# Patient Record
Sex: Female | Born: 1935 | Race: White | Hispanic: No | Marital: Married | Smoking: Never smoker
Health system: Southern US, Community
[De-identification: ages and names within clinical notes are randomized; demographics above are authoritative.]

## PROBLEM LIST (undated history)

## (undated) DIAGNOSIS — I10 Essential (primary) hypertension: Secondary | ICD-10-CM

## (undated) DIAGNOSIS — I4891 Unspecified atrial fibrillation: Secondary | ICD-10-CM

## (undated) DIAGNOSIS — I351 Nonrheumatic aortic (valve) insufficiency: Secondary | ICD-10-CM

## (undated) DIAGNOSIS — I272 Pulmonary hypertension, unspecified: Secondary | ICD-10-CM

## (undated) DIAGNOSIS — J449 Chronic obstructive pulmonary disease, unspecified: Secondary | ICD-10-CM

## (undated) DIAGNOSIS — I340 Nonrheumatic mitral (valve) insufficiency: Secondary | ICD-10-CM

## (undated) DIAGNOSIS — C801 Malignant (primary) neoplasm, unspecified: Secondary | ICD-10-CM

## (undated) DIAGNOSIS — I071 Rheumatic tricuspid insufficiency: Secondary | ICD-10-CM

## (undated) DIAGNOSIS — H269 Unspecified cataract: Secondary | ICD-10-CM

## (undated) DIAGNOSIS — I509 Heart failure, unspecified: Secondary | ICD-10-CM

## (undated) DIAGNOSIS — E079 Disorder of thyroid, unspecified: Secondary | ICD-10-CM

## (undated) DIAGNOSIS — I2510 Atherosclerotic heart disease of native coronary artery without angina pectoris: Secondary | ICD-10-CM

## (undated) HISTORY — DX: Pulmonary hypertension, unspecified: I27.20

## (undated) HISTORY — PX: ABDOMINAL HYSTERECTOMY: SHX81

## (undated) HISTORY — DX: Nonrheumatic aortic (valve) insufficiency: I35.1

## (undated) HISTORY — PX: APPENDECTOMY: SHX54

## (undated) HISTORY — DX: Heart failure, unspecified: I50.9

## (undated) HISTORY — DX: Chronic obstructive pulmonary disease, unspecified: J44.9

## (undated) HISTORY — DX: Nonrheumatic mitral (valve) insufficiency: I34.0

## (undated) HISTORY — DX: Unspecified cataract: H26.9

## (undated) HISTORY — PX: CATARACT EXTRACTION: SUR2

## (undated) HISTORY — DX: Essential (primary) hypertension: I10

## (undated) HISTORY — DX: Rheumatic tricuspid insufficiency: I07.1

## (undated) HISTORY — DX: Disorder of thyroid, unspecified: E07.9

## (undated) HISTORY — DX: Atherosclerotic heart disease of native coronary artery without angina pectoris: I25.10

## (undated) HISTORY — PX: BREAST LUMPECTOMY: SHX2

## (undated) HISTORY — PX: EYE SURGERY: SHX253

---

## 2005-08-23 ENCOUNTER — Encounter: Admission: RE | Admit: 2005-08-23 | Discharge: 2005-08-23 | Payer: Self-pay | Admitting: *Deleted

## 2006-03-28 ENCOUNTER — Ambulatory Visit (HOSPITAL_COMMUNITY): Admission: RE | Admit: 2006-03-28 | Discharge: 2006-03-28 | Payer: Self-pay | Admitting: Internal Medicine

## 2006-10-18 ENCOUNTER — Ambulatory Visit (HOSPITAL_BASED_OUTPATIENT_CLINIC_OR_DEPARTMENT_OTHER): Admission: RE | Admit: 2006-10-18 | Discharge: 2006-10-18 | Payer: Self-pay | Admitting: Orthopedic Surgery

## 2006-10-18 ENCOUNTER — Encounter (INDEPENDENT_AMBULATORY_CARE_PROVIDER_SITE_OTHER): Payer: Self-pay | Admitting: Specialist

## 2007-05-31 ENCOUNTER — Ambulatory Visit (HOSPITAL_COMMUNITY): Admission: RE | Admit: 2007-05-31 | Discharge: 2007-05-31 | Payer: Self-pay | Admitting: Internal Medicine

## 2009-06-12 ENCOUNTER — Ambulatory Visit (HOSPITAL_COMMUNITY): Admission: RE | Admit: 2009-06-12 | Discharge: 2009-06-12 | Payer: Self-pay | Admitting: Internal Medicine

## 2010-01-29 ENCOUNTER — Emergency Department (HOSPITAL_COMMUNITY): Admission: EM | Admit: 2010-01-29 | Discharge: 2010-01-29 | Payer: Self-pay | Admitting: Emergency Medicine

## 2010-02-06 ENCOUNTER — Emergency Department (HOSPITAL_COMMUNITY): Admission: EM | Admit: 2010-02-06 | Discharge: 2010-02-06 | Payer: Self-pay | Admitting: Emergency Medicine

## 2010-03-19 ENCOUNTER — Inpatient Hospital Stay (HOSPITAL_COMMUNITY): Admission: AD | Admit: 2010-03-19 | Discharge: 2010-03-22 | Payer: Self-pay | Admitting: Internal Medicine

## 2010-04-02 ENCOUNTER — Encounter: Admission: RE | Admit: 2010-04-02 | Discharge: 2010-04-02 | Payer: Self-pay | Admitting: Internal Medicine

## 2010-04-07 ENCOUNTER — Encounter: Admission: RE | Admit: 2010-04-07 | Discharge: 2010-04-07 | Payer: Self-pay | Admitting: Internal Medicine

## 2010-04-07 ENCOUNTER — Other Ambulatory Visit: Admission: RE | Admit: 2010-04-07 | Discharge: 2010-04-07 | Payer: Self-pay | Admitting: Interventional Radiology

## 2010-07-15 ENCOUNTER — Ambulatory Visit: Payer: Self-pay | Admitting: Cardiology

## 2011-01-09 IMAGING — US US SOFT TISSUE HEAD/NECK
1 series · 13 of 25 positions shown · non-contrast
Comparison: CT chest of 03/19/2010

CLINICAL DATA: Hyperthyroidism, prominent left thyroid noted on CT
chest of 03/19/2010

THYROID ULTRASOUND
TECHNIQUE: Ultrasound examination of the thyroid gland and
adjacent soft tissues was performed.

[Series 1: us soft tissue head/neck · 0.04mm/px · 13 of 63 slices shown]
[im 1/63]
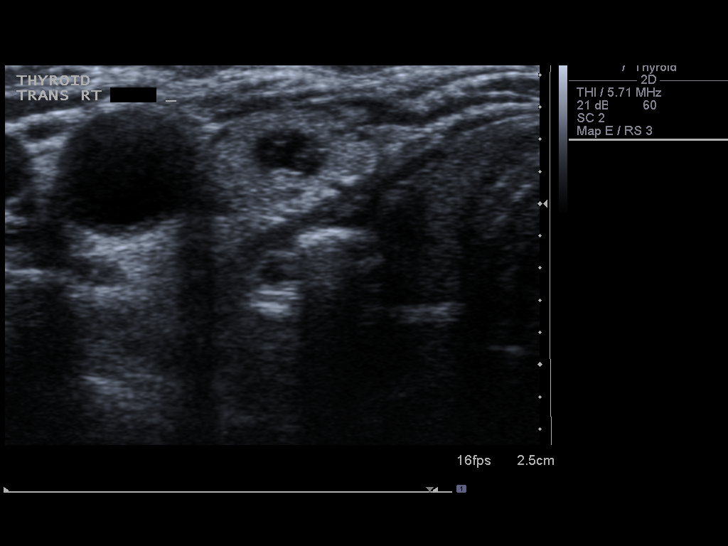
[im 6/63]
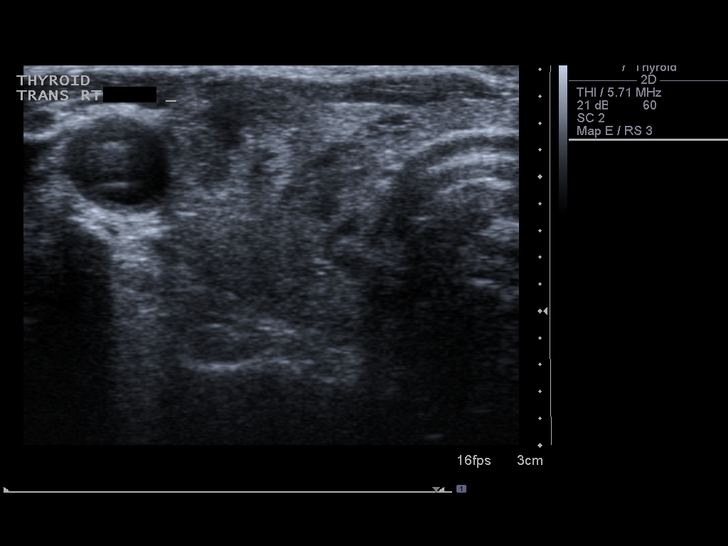
[im 11/63]
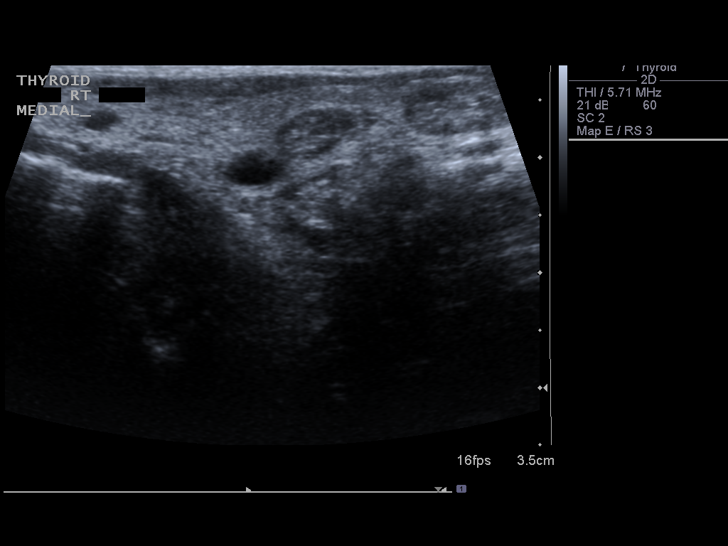
[im 16/63]
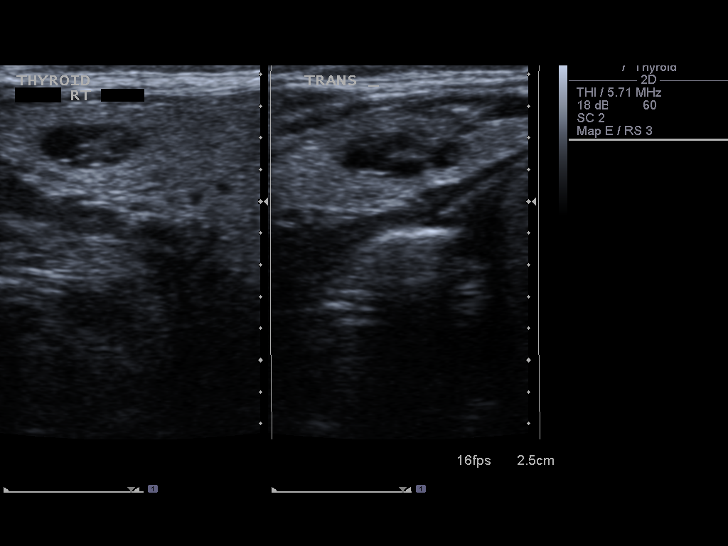
[im 21/63]
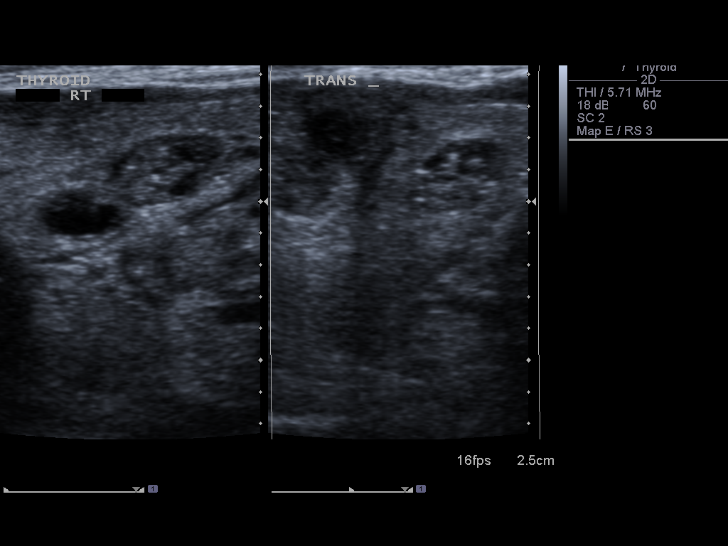
[im 26/63]
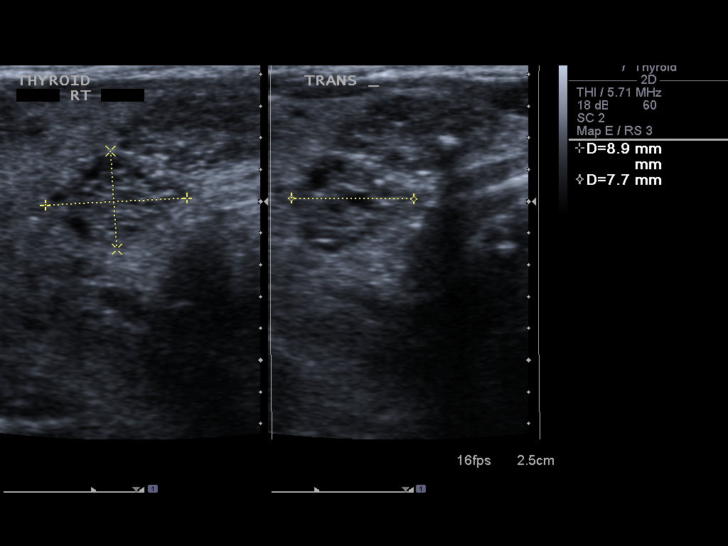
[im 32/63]
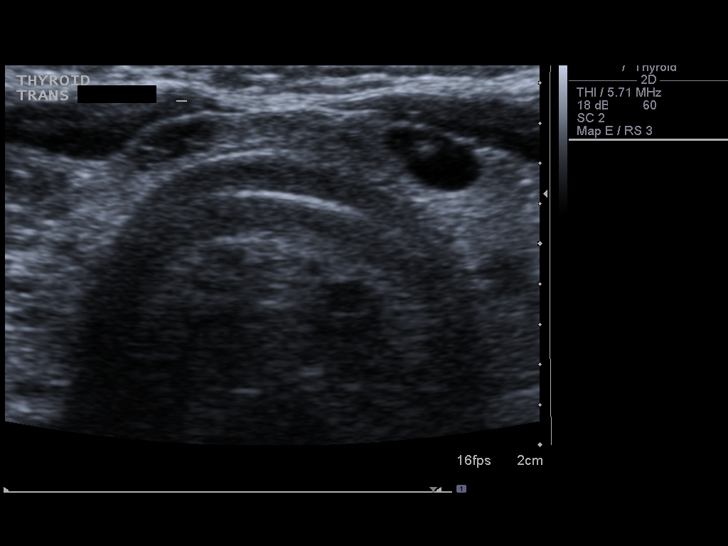
[im 37/63]
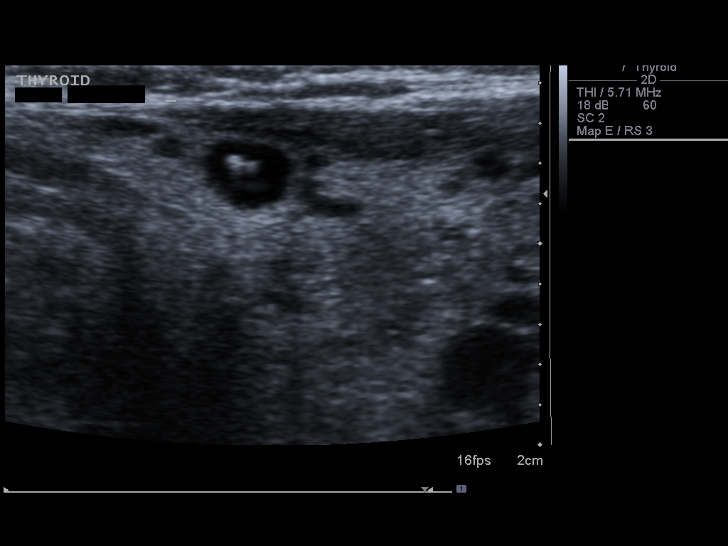
[im 42/63]
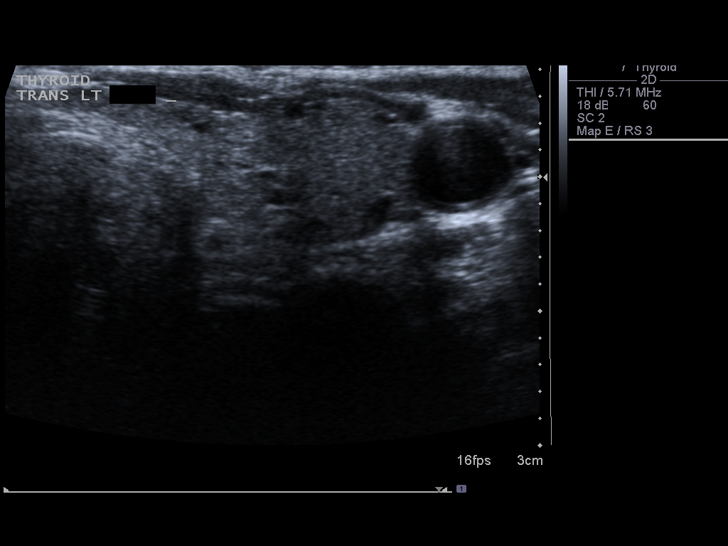
[im 47/63]
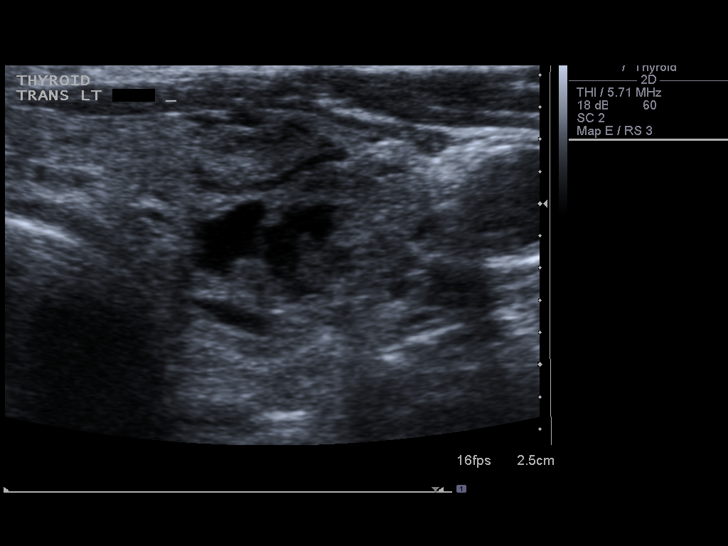
[im 52/63]
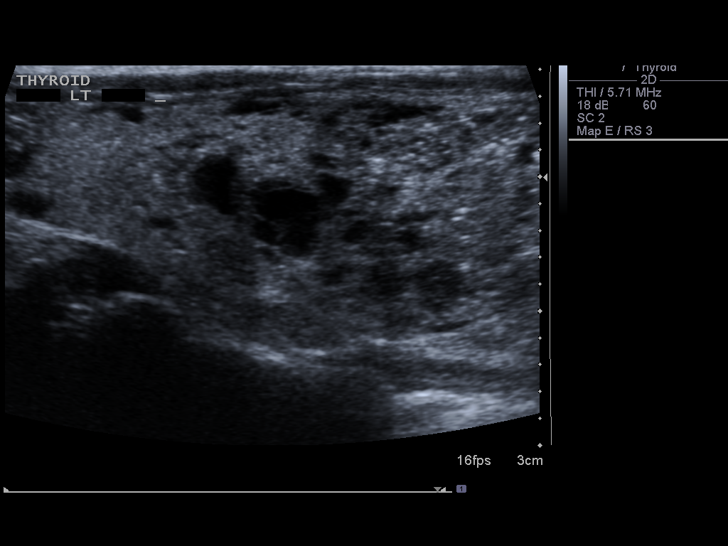
[im 57/63]
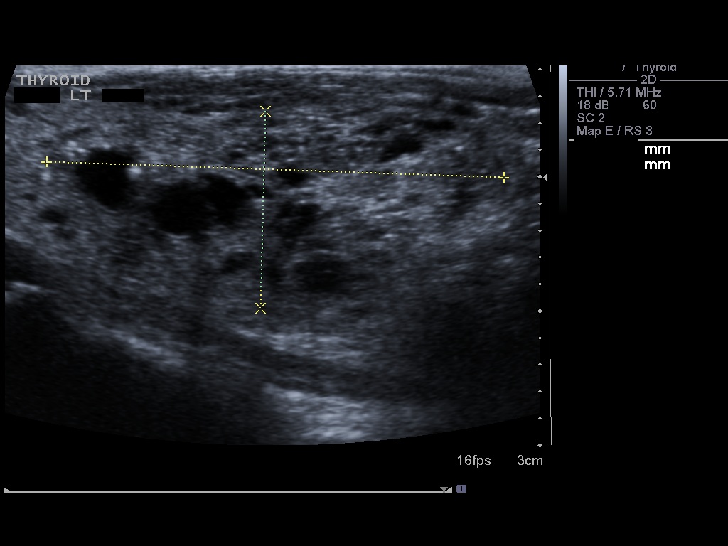
[im 63/63]
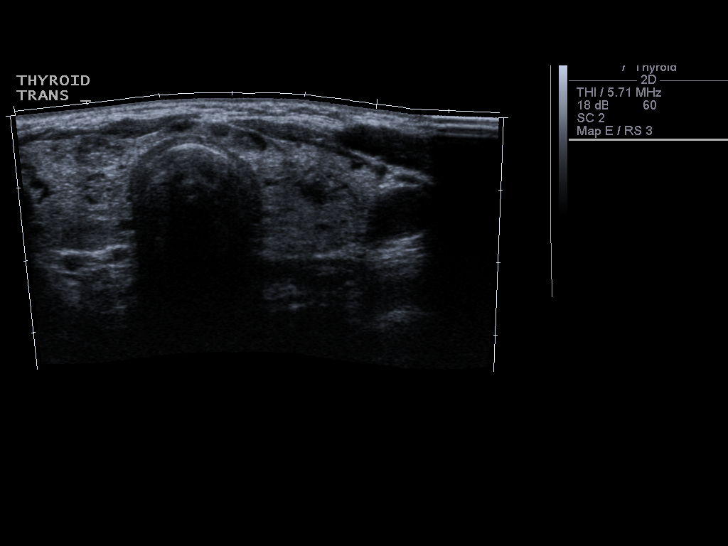

[13 of 25 positions shown; findings below may reference images not displayed]

FINDINGS: The thyroid gland is diffusely prominent.  The right
lobe measures 5.3 cm sagittally, with a depth of 2.1 cm and width
of 1.4 cm.  The left lobe measures 6.1 x 2.1 x 1.7 cm, with the
isthmus measuring 2 mm in thickness.  The thyroid gland is somewhat
hypervascular.  Is there any clinical suspicion of thyroiditis?

Multiple nodules are noted bilaterally.  The dominant nodule is
primarily solid with some cystic components in the left mid lower
lobe of 3.4 x 1.5 x 1.9 cm.  A nodule in the upper pole of the left
lobe is primarily solid measuring 1.1 x 0.7 x 1.0 cm.  On the right
there are multiple nodules present.  The largest nodule is in the
mid right lobe measuring 1.6 x 0.7 x 1.2 cm with a nodule medially
on the right of 1.6 x 0.9 x 0.8 cm.  Small nodules are scattered
throughout the right lobe of no more than 12 mm in diameter.
IMPRESSION: 1.  Multiple thyroid nodules with the dominant nodule primarily
solid within the mid lower left lobe of 3.4 x 1.5 x 1.9 cm.
Consider biopsy of this dominant nodule.
2.  The gland is slightly prominent and somewhat hypervascular with
multiple smaller nodules.  Is there any clinical suspicion of
thyroiditis?

## 2011-01-14 IMAGING — US US BIOPSY
1 series · 6 of 6 positions shown · non-contrast
Comparison: Ultrasound of the thyroid dated 04/02/2010

CLINICAL DATA: Multinodular goiter with dominant left thyroid
nodule.

ULTRASOUND-GUIDED NEEDLE ASPIRATE BIOPSY, LEFT LOBE OF THYROID
The above procedure was discussed with the patient and written
informed consent was obtained.

[Series 1: us biopsy · 6 acquisitions, 6 frames shown]
[im 1/6]
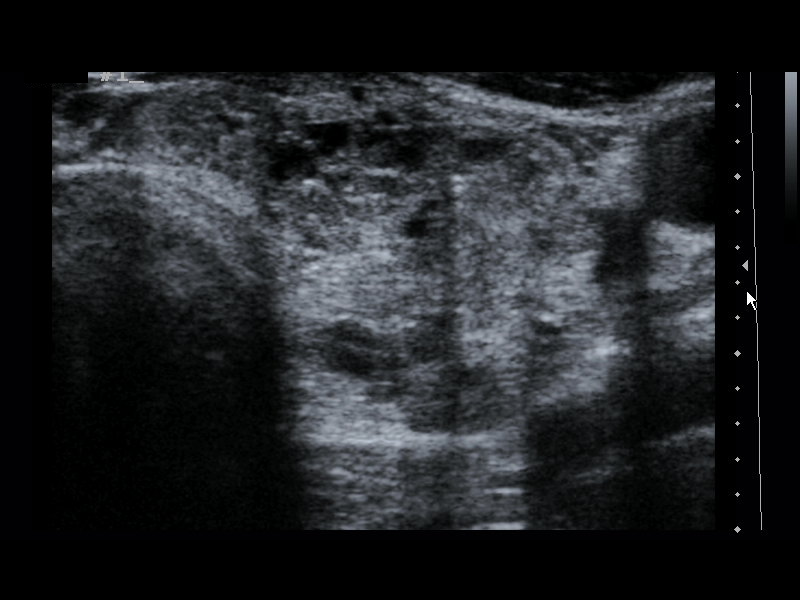
[im 2/6]
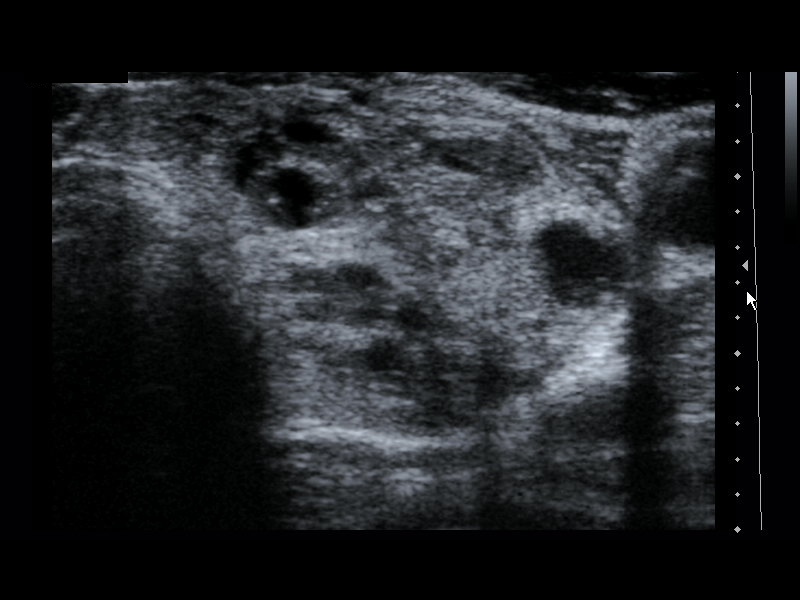
[im 3/6]
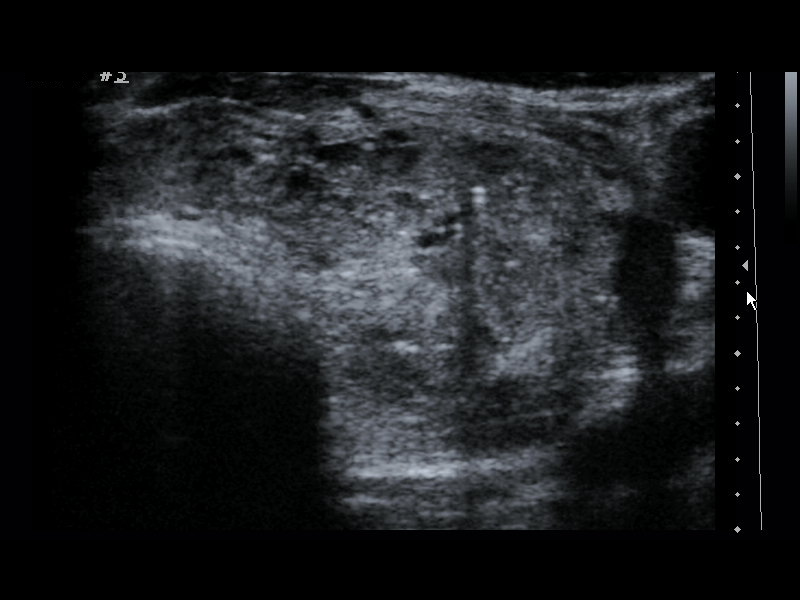
[im 4/6]
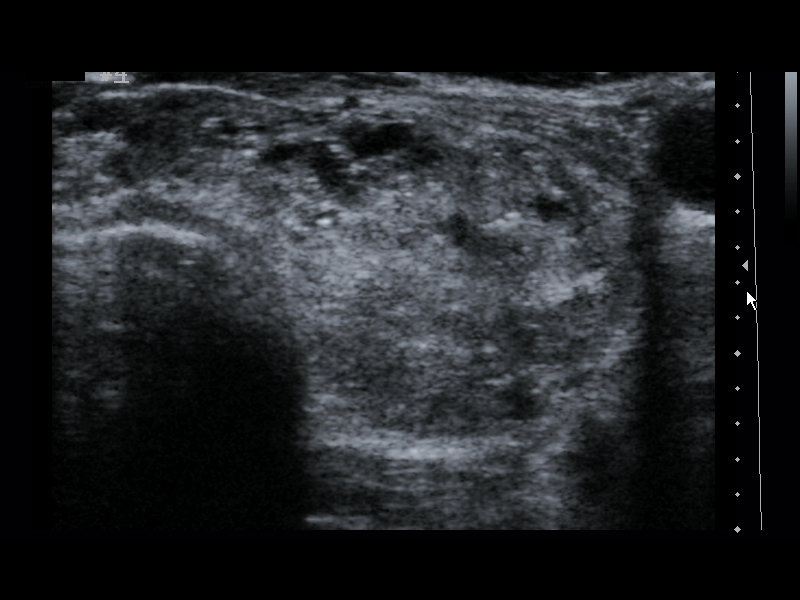
[im 5/6]
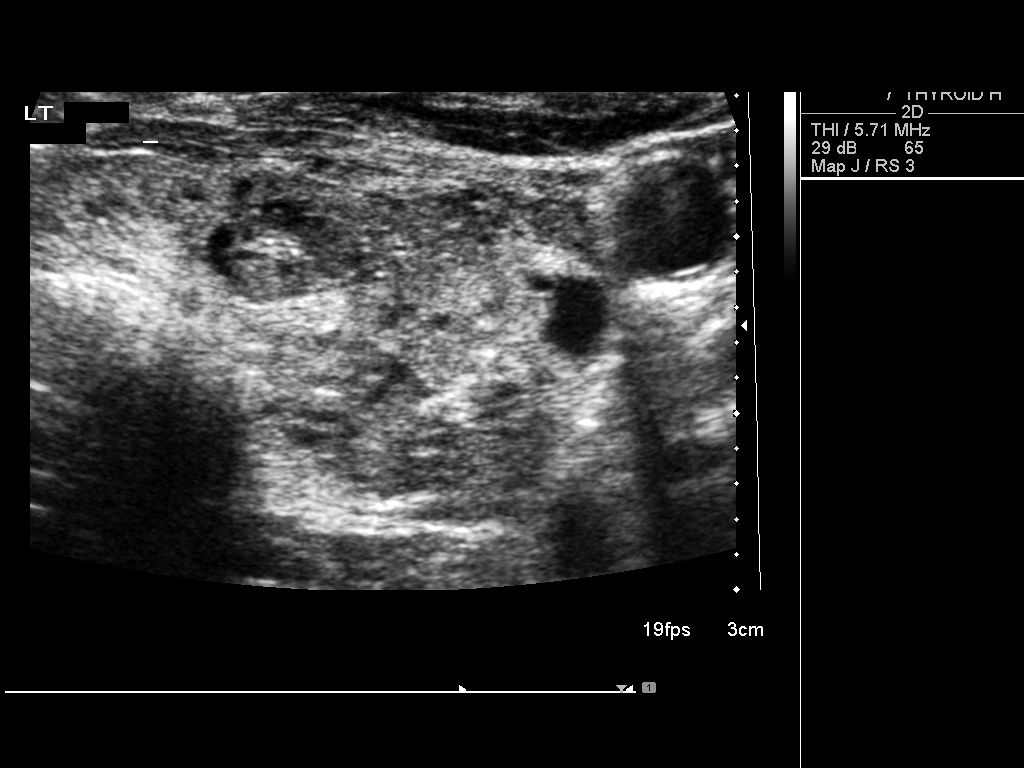
[im 6/6]
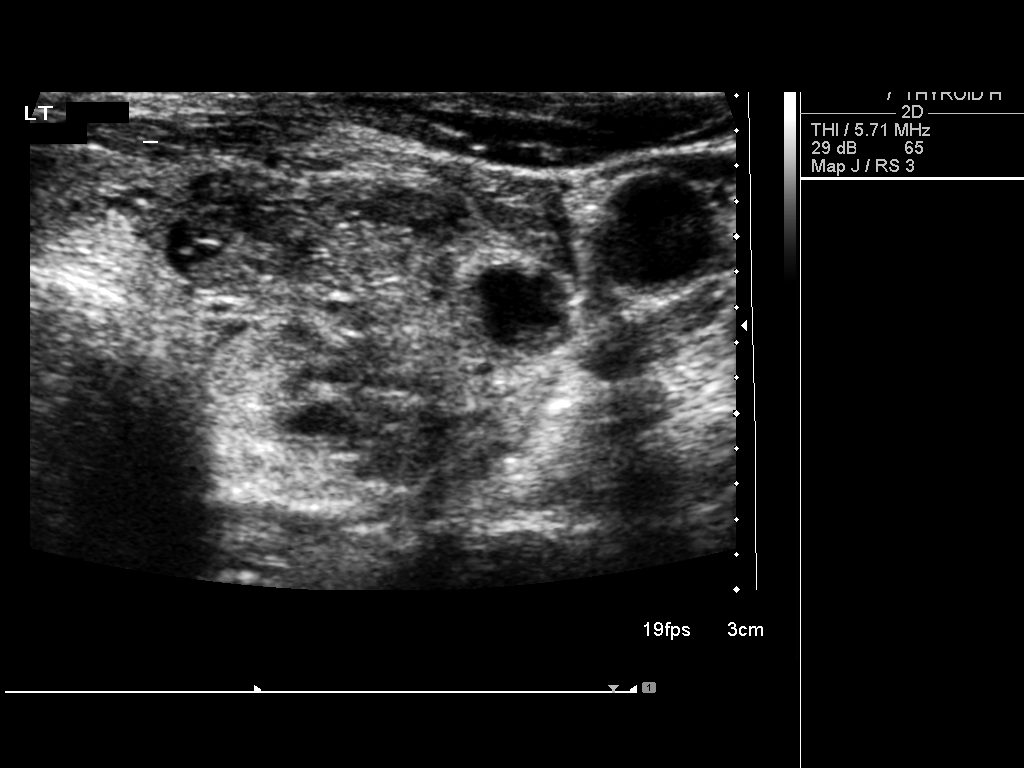

[6 of 6 positions shown; findings below may reference images not displayed]

FINDINGS: Ultrasound was performed to localize and mark an adequate
site for the biopsy.  The patient was then prepped and draped in a
normal sterile fashion.  Local anesthesia was provided with 1%
lidocaine.  Using direct ultrasound guidance, 4 passes were made
using 25 gauge needles into the nodule within the left lobe of the
thyroid.  Ultrasound was used to confirm needle placements on all
occasions.  Specimens were sent to Pathology for analysis.  Post
procedural imaging demonstrated no hematoma or immediate
complication.  The patient tolerated the procedure well.
IMPRESSION: Ultrasound guided thyroid biopsy performed of the dominant left
thyroid nodule.

## 2011-01-24 LAB — BASIC METABOLIC PANEL
BUN: 16 mg/dL (ref 6–23)
CO2: 28 mEq/L (ref 19–32)
Calcium: 8.5 mg/dL (ref 8.4–10.5)
Chloride: 104 mEq/L (ref 96–112)
Creatinine, Ser: 0.69 mg/dL (ref 0.4–1.2)
GFR calc Af Amer: >60 mL/min (ref >60)
GFR calc non Af Amer: >60 mL/min (ref >60)
Glucose, Bld: 105 mg/dL — ABNORMAL HIGH (ref 70–99)
Potassium: 3.4 mEq/L — ABNORMAL LOW (ref 3.5–5.1)
Sodium: 140 mEq/L (ref 135–145)

## 2011-01-24 LAB — CBC
HCT: 33.5 % — ABNORMAL LOW (ref 36.0–46.0)
HCT: 34.9 % — ABNORMAL LOW (ref 36.0–46.0)
Hemoglobin: 11.5 g/dL — ABNORMAL LOW (ref 12.0–15.0)
Hemoglobin: 11.8 g/dL — ABNORMAL LOW (ref 12.0–15.0)
MCHC: 33.6 g/dL (ref 30.0–36.0)
MCHC: 34.3 g/dL (ref 30.0–36.0)
MCV: 88 fL (ref 78.0–100.0)
MCV: 88.2 fL (ref 78.0–100.0)
Platelets: 235 10*3/uL (ref 150–400)
Platelets: 238 10*3/uL (ref 150–400)
RBC: 3.8 MIL/uL — ABNORMAL LOW (ref 3.87–5.11)
RBC: 3.96 MIL/uL (ref 3.87–5.11)
RDW: 14 % (ref 11.5–15.5)
RDW: 14.1 % (ref 11.5–15.5)
WBC: 6 10*3/uL (ref 4.0–10.5)
WBC: 6.1 10*3/uL (ref 4.0–10.5)

## 2011-01-24 LAB — COMPREHENSIVE METABOLIC PANEL
ALT: 29 U/L (ref 0–35)
AST: 23 U/L (ref 0–37)
Albumin: 2.6 g/dL — ABNORMAL LOW (ref 3.5–5.2)
Alkaline Phosphatase: 76 U/L (ref 39–117)
BUN: 17 mg/dL (ref 6–23)
CO2: 28 mEq/L (ref 19–32)
Calcium: 8.4 mg/dL (ref 8.4–10.5)
Chloride: 105 mEq/L (ref 96–112)
Creatinine, Ser: 0.8 mg/dL (ref 0.4–1.2)
GFR calc Af Amer: >60 mL/min (ref >60)
GFR calc non Af Amer: >60 mL/min (ref >60)
Glucose, Bld: 101 mg/dL — ABNORMAL HIGH (ref 70–99)
Potassium: 3.5 mEq/L (ref 3.5–5.1)
Sodium: 136 mEq/L (ref 135–145)
Total Bilirubin: 0.6 mg/dL (ref 0.3–1.2)
Total Protein: 5.9 g/dL — ABNORMAL LOW (ref 6.0–8.3)

## 2011-01-24 LAB — HEPATIC FUNCTION PANEL
ALT: 33 U/L (ref 0–35)
AST: 23 U/L (ref 0–37)
Albumin: 2.5 g/dL — ABNORMAL LOW (ref 3.5–5.2)
Alkaline Phosphatase: 86 U/L (ref 39–117)
Bilirubin, Direct: 0.1 mg/dL (ref 0.0–0.3)
Indirect Bilirubin: 0.7 mg/dL (ref 0.3–0.9)
Total Bilirubin: 0.8 mg/dL (ref 0.3–1.2)
Total Protein: 5.8 g/dL — ABNORMAL LOW (ref 6.0–8.3)

## 2011-01-24 LAB — BRAIN NATRIURETIC PEPTIDE
Pro B Natriuretic peptide (BNP): 296 pg/mL — ABNORMAL HIGH (ref 0.0–100.0)
Pro B Natriuretic peptide (BNP): 319 pg/mL — ABNORMAL HIGH (ref 0.0–100.0)

## 2011-01-25 LAB — PROTIME-INR
INR: 1.1 (ref 0.00–1.49)
Prothrombin Time: 14.1 seconds (ref 11.6–15.2)

## 2011-01-25 LAB — COMPREHENSIVE METABOLIC PANEL
ALT: 64 U/L — ABNORMAL HIGH (ref 0–35)
AST: 55 U/L — ABNORMAL HIGH (ref 0–37)
Albumin: 3 g/dL — ABNORMAL LOW (ref 3.5–5.2)
Alkaline Phosphatase: 132 U/L — ABNORMAL HIGH (ref 39–117)
BUN: 16 mg/dL (ref 6–23)
CO2: 26 mEq/L (ref 19–32)
Calcium: 9.1 mg/dL (ref 8.4–10.5)
Chloride: 101 mEq/L (ref 96–112)
Creatinine, Ser: 0.75 mg/dL (ref 0.4–1.2)
GFR calc Af Amer: >60 mL/min (ref >60)
GFR calc non Af Amer: >60 mL/min (ref >60)
Glucose, Bld: 105 mg/dL — ABNORMAL HIGH (ref 70–99)
Potassium: 3.5 mEq/L (ref 3.5–5.1)
Sodium: 135 mEq/L (ref 135–145)
Total Bilirubin: 1.1 mg/dL (ref 0.3–1.2)
Total Protein: 6.6 g/dL (ref 6.0–8.3)

## 2011-01-25 LAB — CBC
HCT: 35.3 % — ABNORMAL LOW (ref 36.0–46.0)
Hemoglobin: 12.3 g/dL (ref 12.0–15.0)
MCHC: 34.8 g/dL (ref 30.0–36.0)
MCV: 88.5 fL (ref 78.0–100.0)
Platelets: 263 10*3/uL (ref 150–400)
RBC: 3.99 MIL/uL (ref 3.87–5.11)
RDW: 13.9 % (ref 11.5–15.5)
WBC: 10.6 10*3/uL — ABNORMAL HIGH (ref 4.0–10.5)

## 2011-01-25 LAB — BASIC METABOLIC PANEL
BUN: 13 mg/dL (ref 6–23)
CO2: 27 mEq/L (ref 19–32)
Chloride: 99 mEq/L (ref 96–112)
Glucose, Bld: 104 mg/dL — ABNORMAL HIGH (ref 70–99)
Potassium: 2.9 mEq/L — ABNORMAL LOW (ref 3.5–5.1)

## 2011-01-25 LAB — CARDIAC PANEL(CRET KIN+CKTOT+MB+TROPI)
CK, MB: 0.9 ng/mL (ref 0.3–4.0)
CK, MB: 1.4 ng/mL (ref 0.3–4.0)
Relative Index: INVALID (ref 0.0–2.5)
Total CK: 23 U/L (ref 7–177)
Total CK: 26 U/L (ref 7–177)
Total CK: 27 U/L (ref 7–177)
Troponin I: 0.01 ng/mL (ref 0.00–0.06)

## 2011-01-25 LAB — DIFFERENTIAL
Basophils Absolute: 0.1 10*3/uL (ref 0.0–0.1)
Basophils Relative: 1 % (ref 0–1)
Eosinophils Absolute: 0 10*3/uL (ref 0.0–0.7)
Eosinophils Relative: 0 % (ref 0–5)
Lymphocytes Relative: 14 % (ref 12–46)
Lymphs Abs: 1.4 10*3/uL (ref 0.7–4.0)
Monocytes Absolute: 0.7 10*3/uL (ref 0.1–1.0)
Monocytes Relative: 7 % (ref 3–12)
Neutro Abs: 8.4 10*3/uL — ABNORMAL HIGH (ref 1.7–7.7)
Neutrophils Relative %: 79 % — ABNORMAL HIGH (ref 43–77)

## 2011-01-25 LAB — TSH: TSH: 0.536 u[IU]/mL (ref 0.350–4.500)

## 2011-01-25 LAB — APTT: aPTT: 29 seconds (ref 24–37)

## 2011-01-25 LAB — PHOSPHORUS: Phosphorus: 3.2 mg/dL (ref 2.3–4.6)

## 2011-01-25 LAB — MAGNESIUM: Magnesium: 2 mg/dL (ref 1.5–2.5)

## 2011-01-25 LAB — D-DIMER, QUANTITATIVE: D-Dimer, Quant: 0.91 ug/mL-FEU — ABNORMAL HIGH (ref 0.00–0.48)

## 2011-01-25 LAB — BRAIN NATRIURETIC PEPTIDE
Pro B Natriuretic peptide (BNP): 507 pg/mL — ABNORMAL HIGH (ref 0.0–100.0)
Pro B Natriuretic peptide (BNP): 782 pg/mL — ABNORMAL HIGH (ref 0.0–100.0)

## 2011-01-25 LAB — T4, FREE: Free T4: 1.2 ng/dL (ref 0.80–1.80)

## 2011-01-26 LAB — POCT I-STAT, CHEM 8
Chloride: 102 mEq/L (ref 96–112)
HCT: 37 % (ref 36.0–46.0)
Hemoglobin: 12.6 g/dL (ref 12.0–15.0)
Potassium: 3.7 mEq/L (ref 3.5–5.1)
Sodium: 138 mEq/L (ref 135–145)

## 2011-01-31 LAB — BASIC METABOLIC PANEL
CO2: 28 mEq/L (ref 19–32)
Calcium: 9.1 mg/dL (ref 8.4–10.5)
GFR calc Af Amer: >60 mL/min (ref >60)
GFR calc non Af Amer: >60 mL/min (ref >60)
Glucose, Bld: 120 mg/dL — ABNORMAL HIGH (ref 70–99)
Potassium: 3.4 mEq/L — ABNORMAL LOW (ref 3.5–5.1)
Sodium: 137 mEq/L (ref 135–145)

## 2011-01-31 LAB — DIFFERENTIAL
Basophils Absolute: 0 10*3/uL (ref 0.0–0.1)
Eosinophils Relative: 1 % (ref 0–5)
Lymphocytes Relative: 12 % (ref 12–46)
Lymphs Abs: 1.4 10*3/uL (ref 0.7–4.0)
Monocytes Absolute: 0.5 10*3/uL (ref 0.1–1.0)
Neutro Abs: 10.4 10*3/uL — ABNORMAL HIGH (ref 1.7–7.7)

## 2011-01-31 LAB — PROTIME-INR: Prothrombin Time: 13.5 seconds (ref 11.6–15.2)

## 2011-01-31 LAB — CBC
HCT: 39.5 % (ref 36.0–46.0)
Hemoglobin: 13.2 g/dL (ref 12.0–15.0)
WBC: 12.5 10*3/uL — ABNORMAL HIGH (ref 4.0–10.5)

## 2011-02-28 ENCOUNTER — Telehealth: Payer: Self-pay | Admitting: Cardiology

## 2011-02-28 NOTE — Telephone Encounter (Signed)
Pt C/O slow weight gain. Denies SOB, cough but does have bilateral calf edema. Told her we would contact her tomorrow if she needs to be seen by a provider or if Dr Deborah Chalk wants to adjust meds. Msg to be given to [employer]. Alfonso Ramus RN

## 2011-02-28 NOTE — Telephone Encounter (Signed)
PT CALLED, GIVE UPDATE TO WEIGHT GAIN SINCE October. IF NO ANSWER AT HOME # CALL  [phone].  PLACED CHART IN BOX.

## 2011-02-28 NOTE — Telephone Encounter (Signed)
Weight Gain: Last October was 118, now she is 125 has been very gradual gain.

## 2011-03-01 NOTE — Telephone Encounter (Signed)
RN scheduled pt to see Norma Fredrickson NP on 03/02/11.  Pt aware and will bring all medications to appointment.

## 2011-03-02 ENCOUNTER — Ambulatory Visit: Payer: Self-pay | Admitting: Nurse Practitioner

## 2011-03-02 ENCOUNTER — Encounter: Payer: Self-pay | Admitting: Nurse Practitioner

## 2011-03-02 DIAGNOSIS — I509 Heart failure, unspecified: Secondary | ICD-10-CM | POA: Insufficient documentation

## 2011-03-02 DIAGNOSIS — I340 Nonrheumatic mitral (valve) insufficiency: Secondary | ICD-10-CM | POA: Insufficient documentation

## 2011-03-02 DIAGNOSIS — I071 Rheumatic tricuspid insufficiency: Secondary | ICD-10-CM | POA: Insufficient documentation

## 2011-03-02 DIAGNOSIS — I351 Nonrheumatic aortic (valve) insufficiency: Secondary | ICD-10-CM | POA: Insufficient documentation

## 2011-03-02 DIAGNOSIS — I10 Essential (primary) hypertension: Secondary | ICD-10-CM | POA: Insufficient documentation

## 2011-03-02 DIAGNOSIS — J449 Chronic obstructive pulmonary disease, unspecified: Secondary | ICD-10-CM | POA: Insufficient documentation

## 2011-03-02 DIAGNOSIS — I272 Pulmonary hypertension, unspecified: Secondary | ICD-10-CM | POA: Insufficient documentation

## 2011-03-25 NOTE — Op Note (Signed)
Michelle Levine, Michelle Levine             ACCOUNT NO.:  [id number]   MEDICAL RECORD NO.:  [id number]          PATIENT TYPE:  AMB   LOCATION:  [facility]                          FACILITY:  [facility]   PHYSICIAN:  Cindee Salt, M.D.       DATE OF BIRTH:  1922/04/07   DATE OF PROCEDURE:  10/18/2006  DATE OF DISCHARGE:                               OPERATIVE REPORT   PREOPERATIVE DIAGNOSIS:  Mucoid cyst, proximal interphalangeal joint,  left ring finger.   POSTOPERATIVE DIAGNOSIS:  Mucoid cyst, proximal interphalangeal joint,  left ring finger.   OPERATION:  Excision of cyst, debridement of proximal interphalangeal  joint, left ring finger.   SURGEON:  Cindee Salt, M.D.   ASSISTANT:  Carolyne Fiscal R.N.   ANESTHESIA:  Forearm based IV regional.   HISTORY:  The patient is an 77 year old female with a history of a mass  over the PIP joint left ring finger.  This has not responded to  conservative treatment.  X-rays revealed mild degenerative changes of  PIP joint.  She is admitted now for removal of this with debridement of  the PIP joint in an effort to prevent recurrence.  She is aware of risks  and complications including the possibility of recurrence, infection,  injury to arteries, nerves, tendons, incomplete relief of symptoms,  stiffness and dystrophy.  She has elected to proceed to have this  surgically removed being fully aware of risks and complications.  Questions are encouraged and answered in the preoperative area and the  area of the mass marked by both the patient and surgeon.   PROCEDURE:  The patient was brought to the operating room where a  forearm based IV regional anesthetic was carried out without difficulty.  She was prepped using DuraPrep, supine position, left arm free.  After 3-  minute dry time she was draped.  After adequate anesthesia was afforded  to the patient, a curvilinear incision was made directly over the mass,  carried down through subcutaneous tissue.  This was carried  proximally  and distally onto the dorsal aspect of the PIP joint proximal and middle  phalanges.  The cyst was immediately encountered.  This was found be  multiloculated.  This was found to arise directly through the interval  between the central slip lateral band.  This area was opened.  The cyst  was removed in toto and sent to pathology.  The joint was opened.  Osteophytes on the proximal phalanx were then removed with a small  rongeur along with a synovectomy of the joint.  The wound was copiously  irrigated with saline.  The extensor tendon was then repaired with  figure-of-eight 5-0 Vicryl sutures.  The  skin was repaired with interrupted 5-0 nylon sutures.  Sterile  compressive dressing and splint to the finger was applied.  The patient  tolerated the procedure well and was taken to the recovery observation  in satisfactory condition.  She will be discharged home to return to the  [facility] in one week on Vicodin.           ______________________________  Cindee Salt,  M.D.     Angelique Blonder  D:  10/18/2006  T:  10/18/2006  Job:  [id number]   cc:   Loraine Leriche A. Perini, M.D.

## 2011-03-29 ENCOUNTER — Ambulatory Visit (INDEPENDENT_AMBULATORY_CARE_PROVIDER_SITE_OTHER): Payer: Medicare Other | Admitting: Cardiology

## 2011-03-29 ENCOUNTER — Encounter: Payer: Self-pay | Admitting: Cardiology

## 2011-03-29 VITALS — BP 178/90 | HR 66 | Ht 65.0 in | Wt 124.5 lb

## 2011-03-29 DIAGNOSIS — I359 Nonrheumatic aortic valve disorder, unspecified: Secondary | ICD-10-CM

## 2011-03-29 DIAGNOSIS — I351 Nonrheumatic aortic (valve) insufficiency: Secondary | ICD-10-CM

## 2011-03-29 NOTE — Progress Notes (Signed)
Subjective:   Michelle Levine is a very pleasant 77 year old patient with a history of aortic insufficiency, mitral insufficiency, and moderate to severe tricuspid regurgitation with moderate pulmonary hypertension. She does have a history of systolic hypertension.overall, she's been doing reasonably well he is careful about her sodium intake. She lives with her husband is able to walk and exercise daily.  She was hospitalized with congestive heart fair in may of 2011. She was hospitalized with what was felt to be a community acquired pneumonia and resultant congestive heart failure. She is gradually improved and has done well over the past year. She is not felt to be a candidate for surgical correction of her valvular heart disease.  Her other problems include COPD, hypertension, and previous surgeries include cataract surgery, breast lumpectomy, and remote appendectomy.  Current Outpatient Prescriptions  Medication Sig Dispense Refill  . aspirin 81 MG tablet Take 81 mg by mouth 3 (three) times a week.        . Calcium Carbonate-Vitamin D (CALCIUM + D PO) Take 600 mg by mouth daily.        . citalopram (CELEXA) 20 MG tablet Take 20 mg by mouth daily.        . enalapril (VASOTEC) 10 MG tablet Take 20 mg by mouth daily.        . fish oil-omega-3 fatty acids 1000 MG capsule Take 2 g by mouth daily. 2 DAILY       . furosemide (LASIX) 40 MG tablet Take 40 mg by mouth daily.        . methimazole (TAPAZOLE) 10 MG tablet Take 10 mg by mouth daily.        . Multiple Vitamin (MULTIVITAMIN) tablet Take 1 tablet by mouth daily.        . nadolol (CORGARD) 40 MG tablet Take 20 mg by mouth daily.        . potassium chloride (K-DUR) 10 MEQ tablet Take 10 mEq by mouth daily.        . zoledronic acid (RECLAST) 5 MG/100ML SOLN Inject 5 mg into the vein. Once every (2) two years         No Known Allergies  Patient Active Problem List  Diagnoses  . Aortic insufficiency  . Mitral insufficiency  . Mild tricuspid  regurgitation  . Pulmonary hypertension  . CHF (congestive heart failure)  . COPD (chronic obstructive pulmonary disease)  . Hypertension    History  Smoking status  . Never Smoker   Smokeless tobacco  . Never Used    History  Alcohol Use No    Family History  Problem Relation Age of Onset  . Heart attack Mother 67  . Pneumonia Father 32  . Heart attack Brother   . Leukemia Brother   . Heart attack Brother   . Leukemia Brother     Review of Systems:   The patient denies any heat or cold intolerance.  No weight gain or weight loss.  The patient denies headaches or blurry vision.  There is no cough or sputum production.  The patient denies dizziness.  There is no hematuria or hematochezia.  The patient denies any muscle aches or arthritis.  The patient denies any rash.  The patient denies frequent falling or instability.  There is no history of depression or anxiety.  All other systems were reviewed and are negative.   Physical Exam:   She's a pleasant elderly female currently in no acute distress. Blood pressure initially was  178/98 followup was 148/90. Heart rate was 66. Weight is 124. HEENT is unremarkable. There's no JVD venous distention. Lungs show prolonged expiration. She has a grade 2/6 murmur of aortic insufficiency and a grade 2/6 systolic regurgitant murmur heard loudest at the apex. She is in sinus rhythm. Her abdomen is soft and nontender. Extremities are without edema. Peripheral pulses are adequate. Neurologically she's intact. She has full range of motion. She is not depressed. Skin is warm and dry. Assessment / Plan:

## 2011-03-29 NOTE — Assessment & Plan Note (Signed)
She has multi-valvular compensated congestive heart failure and she is not a surgical candidate. She has done well with medical management. I encouraged her to continue be careful about pulmonary infections. We'll need be careful about her hypertension but otherwise she needs to continue her exercise and sodium restriction. I will have her see Dr. Marca Ancona in 8 months.

## 2011-04-28 ENCOUNTER — Telehealth: Payer: Self-pay | Admitting: Cardiology

## 2011-04-28 NOTE — Telephone Encounter (Signed)
Been having some sporadic chest discomfort on her left side. She says that she feels fine, she is not short of breath and she can sleep fine at night. Please call back. I have pulled her chart.

## 2011-04-28 NOTE — Telephone Encounter (Signed)
C/O odd CP left breast area, denies n/v, sob, diaphoresis. Pain lasts less than one minute and feels like a stinging sensation. Occurs 2-3 times a day for 2 days. C/O more frequent belching.  VSS, 144/76, no wt change due to CHF. States she feels fine and been shopping and doing her usual activities. Consulted Dr [place], this is a Dr Deborah Chalk pt and will be seeing Dr Shirlee Latch. App made for Dr Jens Som 05/10/11 or to call back and will get in sooner w/ Dr Shirlee Latch if needed. Pt verbalized understanding. Alfonso Ramus RN

## 2011-05-10 ENCOUNTER — Ambulatory Visit: Payer: Medicare Other | Admitting: Cardiology

## 2011-09-21 ENCOUNTER — Other Ambulatory Visit (HOSPITAL_COMMUNITY): Payer: Self-pay | Admitting: *Deleted

## 2011-10-04 ENCOUNTER — Encounter (HOSPITAL_COMMUNITY)
Admission: RE | Admit: 2011-10-04 | Discharge: 2011-10-04 | Disposition: A | Discharge status: Home / Self Care | Payer: Medicare Other | Source: Ambulatory Visit | Attending: Internal Medicine | Admitting: Internal Medicine

## 2011-10-04 ENCOUNTER — Encounter (HOSPITAL_COMMUNITY): Payer: Self-pay

## 2011-10-04 DIAGNOSIS — M949 Disorder of cartilage, unspecified: Secondary | ICD-10-CM | POA: Insufficient documentation

## 2011-10-04 DIAGNOSIS — M899 Disorder of bone, unspecified: Secondary | ICD-10-CM | POA: Insufficient documentation

## 2011-10-04 MED ORDER — ZOLEDRONIC ACID 5 MG/100ML IV SOLN
5.0000 mg | INTRAVENOUS | Status: AC
  Administered 2011-10-04: 5 mg via INTRAVENOUS
  Filled 2011-10-04: qty 100

## 2011-10-04 MED ORDER — SODIUM CHLORIDE 0.9 % IV SOLN: INTRAVENOUS | Status: AC

## 2011-10-25 ENCOUNTER — Emergency Department (HOSPITAL_COMMUNITY): Payer: Medicare Other

## 2011-10-25 ENCOUNTER — Other Ambulatory Visit: Payer: Self-pay

## 2011-10-25 ENCOUNTER — Encounter (HOSPITAL_COMMUNITY): Payer: Self-pay | Admitting: *Deleted

## 2011-10-25 ENCOUNTER — Emergency Department (HOSPITAL_COMMUNITY)
Admission: EM | Admit: 2011-10-25 | Discharge: 2011-10-25 | Disposition: A | Discharge status: Home / Self Care | Payer: Medicare Other | Attending: Emergency Medicine | Admitting: Emergency Medicine

## 2011-10-25 DIAGNOSIS — R0789 Other chest pain: Secondary | ICD-10-CM | POA: Insufficient documentation

## 2011-10-25 DIAGNOSIS — I509 Heart failure, unspecified: Secondary | ICD-10-CM | POA: Insufficient documentation

## 2011-10-25 DIAGNOSIS — J4489 Other specified chronic obstructive pulmonary disease: Secondary | ICD-10-CM | POA: Insufficient documentation

## 2011-10-25 DIAGNOSIS — I517 Cardiomegaly: Secondary | ICD-10-CM | POA: Insufficient documentation

## 2011-10-25 DIAGNOSIS — I10 Essential (primary) hypertension: Secondary | ICD-10-CM | POA: Insufficient documentation

## 2011-10-25 DIAGNOSIS — I079 Rheumatic tricuspid valve disease, unspecified: Secondary | ICD-10-CM | POA: Insufficient documentation

## 2011-10-25 DIAGNOSIS — J449 Chronic obstructive pulmonary disease, unspecified: Secondary | ICD-10-CM | POA: Insufficient documentation

## 2011-10-25 DIAGNOSIS — I2789 Other specified pulmonary heart diseases: Secondary | ICD-10-CM | POA: Insufficient documentation

## 2011-10-25 DIAGNOSIS — I080 Rheumatic disorders of both mitral and aortic valves: Secondary | ICD-10-CM | POA: Insufficient documentation

## 2011-10-25 LAB — BASIC METABOLIC PANEL
BUN: 20 mg/dL (ref 6–23)
CO2: 26 mEq/L (ref 19–32)
Chloride: 103 mEq/L (ref 96–112)
Creatinine, Ser: 0.72 mg/dL (ref 0.50–1.10)
GFR calc Af Amer: 87 mL/min — ABNORMAL LOW (ref >90)
Glucose, Bld: 158 mg/dL — ABNORMAL HIGH (ref 70–99)
Potassium: 3.7 mEq/L (ref 3.5–5.1)

## 2011-10-25 LAB — DIFFERENTIAL
Lymphs Abs: 1.7 10*3/uL (ref 0.7–4.0)
Monocytes Absolute: 0.5 10*3/uL (ref 0.1–1.0)
Monocytes Relative: 7 % (ref 3–12)
Neutro Abs: 4.8 10*3/uL (ref 1.7–7.7)
Neutrophils Relative %: 65 % (ref 43–77)

## 2011-10-25 LAB — CARDIAC PANEL(CRET KIN+CKTOT+MB+TROPI)
CK, MB: 3 ng/mL (ref 0.3–4.0)
Total CK: 56 U/L (ref 7–177)

## 2011-10-25 LAB — PROTIME-INR
INR: 1.09 (ref 0.00–1.49)
Prothrombin Time: 14.3 seconds (ref 11.6–15.2)

## 2011-10-25 LAB — CBC
HCT: 41.7 % (ref 36.0–46.0)
Hemoglobin: 13.8 g/dL (ref 12.0–15.0)
MCHC: 33.1 g/dL (ref 30.0–36.0)
RBC: 4.6 MIL/uL (ref 3.87–5.11)

## 2011-10-25 MED ORDER — ASPIRIN 81 MG PO CHEW
324.0000 mg | CHEWABLE_TABLET | Freq: Once | ORAL | Status: AC
  Administered 2011-10-25: 324 mg via ORAL
  Filled 2011-10-25: qty 4

## 2011-10-25 MED ORDER — IOHEXOL 300 MG/ML  SOLN
100.0000 mL | Freq: Once | INTRAMUSCULAR | Status: AC | PRN
  Administered 2011-10-25: 100 mL via INTRAVENOUS

## 2011-10-25 NOTE — ED Provider Notes (Signed)
History     CSN: [id number] Arrival date & time: 10/25/2011  8:16 AM   First MD Initiated Contact with Patient 10/25/11 [phone]      No chief complaint on file.   (Consider location/radiation/quality/duration/timing/severity/associated sxs/prior treatment) The history is provided by the patient.   77 year old female has been having episodes of chest pain this morning. The chest pain is described as a tight feeling across the chest without radiation. It is moderate in severity with maximum discomfort being at 5/10. Currently she is not having any discomfort. There's been no associated shortness of breath, nausea, vomiting, or diaphoresis. She has not had episodes like this before. Episodes will last for several minutes before resolving spontaneously. Nothing seems to make them better nothing seems to make them worse. She specifically denies any affect from lying down, sitting up, walking. She did have surgery yesterday to remove a basal cell carcinoma from her upper lip. Her only cardiac risk factors hypertension.  Past Medical History  Diagnosis Date  . Aortic insufficiency   . Mitral insufficiency   . Mild tricuspid regurgitation   . Pulmonary hypertension   . CHF (congestive heart failure)   . COPD (chronic obstructive pulmonary disease)   . Hypertension     Past Surgical History  Procedure Date  . Cataract extraction   . Breast lumpectomy   . Appendectomy     Family History  Problem Relation Age of Onset  . Heart attack Mother 55  . Pneumonia Father 24  . Heart attack Brother   . Leukemia Brother   . Heart attack Brother   . Leukemia Brother     History  Substance Use Topics  . Smoking status: Never Smoker   . Smokeless tobacco: Never Used  . Alcohol Use: No    OB History    Grav Para Term Preterm Abortions TAB SAB Ect Mult Living                  Review of Systems  All other systems reviewed and are negative.    Allergies  Review of patient's allergies  indicates no known allergies.  Home Medications   Current Outpatient Rx  Name Route Sig Dispense Refill  . ASPIRIN 81 MG PO TABS Oral Take 81 mg by mouth 3 (three) times a week.      Marland Kitchen CALCIUM + D PO Oral Take 600 mg by mouth daily.      Marland Kitchen CITALOPRAM HYDROBROMIDE 20 MG PO TABS Oral Take 20 mg by mouth daily.      . ENALAPRIL MALEATE 10 MG PO TABS Oral Take 20 mg by mouth daily.      . OMEGA-3 FATTY ACIDS 1000 MG PO CAPS Oral Take 2 g by mouth daily. 2 DAILY     . FUROSEMIDE 40 MG PO TABS Oral Take 40 mg by mouth daily.      Marland Kitchen METHIMAZOLE 10 MG PO TABS Oral Take 10 mg by mouth daily.      Marland Kitchen ONE-DAILY MULTI VITAMINS PO TABS Oral Take 1 tablet by mouth daily.      Marland Kitchen NADOLOL 40 MG PO TABS Oral Take 20 mg by mouth daily.      Marland Kitchen POTASSIUM CHLORIDE CR 10 MEQ PO TBCR Oral Take 10 mEq by mouth daily.      Marland Kitchen ZOLEDRONIC ACID 5 MG/100ML IV SOLN Intravenous Inject 5 mg into the vein. Once every (2) two years       BP 187/87  Pulse 70  Temp(Src) 97.7 F (36.5 C) (Oral)  Resp 20  SpO2 98%  Physical Exam  Nursing note and vitals reviewed.  77 year old female who is resting comfortably and in no acute distress. Vital signs are significant for hypertension with blood pressure 187/87. Oxygen saturation is a satisfactory 98% on room air. Head is normocephalic. PERRLA, EOMI. Dressing is present in the upper lip it is not removed. Oropharynx is clear. Neck is supple without adenopathy, JVD. Back is nontender. Lungs are clear without rales, wheezes, rhonchi. Heart has regular rate and rhythm without murmur. Abdomen there is no chest wall tenderness. Abdomen is soft, flat, nontender. There no masses or hepatosplenomegaly. Extremities have no cyanosis or edema, full range of motion present. Skin is warm and dry without rash. Neurologic: Mental status is normal, cranial nerves are intact, there no focal motor or sensory deficits. Psychiatric: No abnormalities of mood or affect.  ED Course  Procedures (including  critical care time)   Labs Reviewed  CBC  DIFFERENTIAL  BASIC METABOLIC PANEL  D-DIMER, QUANTITATIVE  PROTIME-INR  APTT  CARDIAC PANEL(CRET KIN+CKTOT+MB+TROPI)  CARDIAC PANEL(CRET KIN+CKTOT+MB+TROPI)   No results found.  Results for orders placed during the hospital encounter of 10/25/11  CBC      Component Value Range   WBC 7.3  4.0 - 10.5 (K/uL)   RBC 4.60  3.87 - 5.11 (MIL/uL)   Hemoglobin 13.8  12.0 - 15.0 (g/dL)   HCT 47.8  29.5 - 62.1 (%)   MCV 90.7  78.0 - 100.0 (fL)   MCH 30.0  26.0 - 34.0 (pg)   MCHC 33.1  30.0 - 36.0 (g/dL)   RDW 30.8  65.7 - 84.6 (%)   Platelets 191  150 - 400 (K/uL)  DIFFERENTIAL      Component Value Range   Neutrophils Relative 65  43 - 77 (%)   Neutro Abs 4.8  1.7 - 7.7 (K/uL)   Lymphocytes Relative 24  12 - 46 (%)   Lymphs Abs 1.7  0.7 - 4.0 (K/uL)   Monocytes Relative 7  3 - 12 (%)   Monocytes Absolute 0.5  0.1 - 1.0 (K/uL)   Eosinophils Relative 4  0 - 5 (%)   Eosinophils Absolute 0.3  0.0 - 0.7 (K/uL)   Basophils Relative 1  0 - 1 (%)   Basophils Absolute 0.1  0.0 - 0.1 (K/uL)  BASIC METABOLIC PANEL      Component Value Range   Sodium 138  135 - 145 (mEq/L)   Potassium 3.7  3.5 - 5.1 (mEq/L)   Chloride 103  96 - 112 (mEq/L)   CO2 26  19 - 32 (mEq/L)   Glucose, Bld 158 (*) 70 - 99 (mg/dL)   BUN 20  6 - 23 (mg/dL)   Creatinine, Ser 9.62  0.50 - 1.10 (mg/dL)   Calcium 9.0  8.4 - 95.2 (mg/dL)   GFR calc non Af Amer 75 (*) >90 (mL/min)   GFR calc Af Amer 87 (*) >90 (mL/min)  D-DIMER, QUANTITATIVE      Component Value Range   D-Dimer, Quant 0.58 (*) 0.00 - 0.48 (ug/mL-FEU)  PROTIME-INR      Component Value Range   Prothrombin Time 14.3  11.6 - 15.2 (seconds)   INR 1.09  0.00 - 1.49   APTT      Component Value Range   aPTT 30  24 - 37 (seconds)  CARDIAC PANEL(CRET KIN+CKTOT+MB+TROPI)      Component Value Range   Total CK 56  7 - 177 (U/L)   CK, MB 3.0  0.3 - 4.0 (ng/mL)   Troponin I <0.30  <0.30 (ng/mL)   Relative Index  RELATIVE INDEX IS INVALID  0.0 - 2.5    Ct Angio Chest W/cm &/or Wo Cm  10/25/2011   *RADIOLOGY REPORT*  Clinical Data:  Chest pain.  CT ANGIOGRAPHY CHEST WITH CONTRAST  Technique:  Multidetector CT imaging of the chest was performed using the standard protocol during bolus administration of intravenous contrast.  Multiplanar CT image reconstructions including MIPs were obtained to evaluate the vascular anatomy.  Contrast: OMNIPAQUE IOHEXOL 300 MG/ML IV SOLN  Comparison:  Chest x-ray dated 10/26/2011 and chest CT dated 03/19/2010  Findings:  There are no pulmonary emboli.  There are patchy areas of scarring at the lung bases and in the right upper lobe.  The small nodular density in the right lower lobe is less prominent than on the prior study and not felt to be significant.  There is increased cardiomegaly.  No effusions.  No acute osseous abnormality.  Visualized portion of the upper abdomen demonstrates a large posterior gastric fundal diverticulum.  Review of the MIP images confirms the above findings.  IMPRESSION: No acute abnormalities.  No pulmonary emboli.  Chronic cardiomegaly, slightly increased.  Original Report Authenticated By: Gwynn Burly, M.D.   Dg Chest Portable 1 View  10/25/2011  *RADIOLOGY REPORT*  Clinical Data: Chest pain, hypertension, CHF, COPD  PORTABLE CHEST - 1 VIEW  Comparison: Mar 21, 2010  Findings: There is mild cardiomegaly.  Hyperexpansion and diffuse interstitial thickening suggests an element of COPD.  No focal infiltrates or effusions are identified.  The mediastinum and pulmonary vasculature are within normal limits.  IMPRESSION: Mild cardiomegaly.  COPD.  Original Report Authenticated By: Brandon Melnick, M.D.     No diagnosis found.   Date: 10/25/2011  Rate: 66  Rhythm: normal sinus rhythm  QRS Axis: normal  Intervals: PR prolonged  ST/T Wave abnormalities: nonspecific ST changes  Conduction Disutrbances:first-degree A-V block   Narrative  Interpretation: First degree AV block, left ventricular hypertrophy, minimal ST depression in the anterolateral leads will and T wave inversion in aVL which are slightly worse than ECG done on 10/16/2006.  Old EKG Reviewed: changes noted  She has had no further chest pain while in the emergency department. Workup is negative for serious pathology. She has a cardiologist for followup and is advised to followup in the next several days.  MDM  Chest pain, rule out cardiac etiology. Also need to rule out pulmonary embolism.        Dione Booze, MD 10/25/11 1310

## 2011-10-25 NOTE — ED Notes (Signed)
Chest tightness that started this morning, only lasting a brief period

## 2011-10-27 ENCOUNTER — Encounter: Payer: Self-pay | Admitting: Cardiology

## 2011-10-27 ENCOUNTER — Ambulatory Visit (INDEPENDENT_AMBULATORY_CARE_PROVIDER_SITE_OTHER): Payer: Medicare Other | Admitting: Cardiology

## 2011-10-27 VITALS — BP 144/88 | HR 76 | Ht 65.0 in | Wt 134.1 lb

## 2011-10-27 DIAGNOSIS — I080 Rheumatic disorders of both mitral and aortic valves: Secondary | ICD-10-CM

## 2011-10-27 DIAGNOSIS — I509 Heart failure, unspecified: Secondary | ICD-10-CM

## 2011-10-27 DIAGNOSIS — R079 Chest pain, unspecified: Secondary | ICD-10-CM | POA: Insufficient documentation

## 2011-10-27 NOTE — Progress Notes (Signed)
HPI: Pleasant female previously followed by Dr Deborah Chalk for fu of aortic insufficiency, mitral insufficiency, and moderate to severe tricuspid regurgitation with moderate pulmonary hypertension. Last echocardiogram in April of 2011 revealed normal LV function. There was mitral valve prolapse with moderate to severe mitral regurgitation. There was moderate to severe aortic insufficiency and moderate to severe tricuspid regurgitation. There was mild to moderate pulmonary hypertension. Seen in ER 12/18 with chest pain; Hgb and troponin normal; ddimer mildly elevated; CTA showed no pulmonary embolus. Note she states that she developed substernal chest tightness. The pain would come and go for approximately 2 hours. There was some improvement with belching. The pain did not radiate. It was not pleuritic, positional or exertional. No associated symptoms. No chest pain since. She dyspnea with more extreme activities but not routine activities. No orthopnea, PND or pedal edema. No exertional chest pain.  Current Outpatient Prescriptions  Medication Sig Dispense Refill  . aspirin 81 MG tablet Take 81 mg by mouth 3 (three) times a week. On mondays, Wednesday, and fridays      . Calcium Carbonate-Vitamin D (CALCIUM + D PO) Take 600 mg by mouth daily.        . citalopram (CELEXA) 20 MG tablet Take 20 mg by mouth daily.        . enalapril (VASOTEC) 10 MG tablet Take 20 mg by mouth daily.        . fish oil-omega-3 fatty acids 1000 MG capsule Take 2 g by mouth daily.       . furosemide (LASIX) 40 MG tablet Take 40 mg by mouth daily.        . methimazole (TAPAZOLE) 10 MG tablet Take 5 mg by mouth daily.       . Multiple Vitamin (MULITIVITAMIN WITH MINERALS) TABS Take 1 tablet by mouth daily.        . nadolol (CORGARD) 40 MG tablet Take 20 mg by mouth daily.       . potassium chloride (K-DUR) 10 MEQ tablet Take 10 mEq by mouth daily.        . zoledronic acid (RECLAST) 5 MG/100ML SOLN Inject 5 mg into the vein. Once  every (2) two years          Past Medical History  Diagnosis Date  . Aortic insufficiency   . Mitral insufficiency   . Mild tricuspid regurgitation   . Pulmonary hypertension   . CHF (congestive heart failure)   . COPD (chronic obstructive pulmonary disease)   . Hypertension     Past Surgical History  Procedure Date  . Cataract extraction   . Breast lumpectomy   . Appendectomy     History   Social History  . Marital Status: Married    Spouse Name: N/A    Number of Children: N/A  . Years of Education: N/A   Occupational History  . retired    Social History Main Topics  . Smoking status: Never Smoker   . Smokeless tobacco: Never Used  . Alcohol Use: No  . Drug Use: No  . Sexually Active: Not on file   Other Topics Concern  . Not on file   Social History Narrative  . No narrative on file    ROS: no fevers or chills, productive cough, hemoptysis, dysphasia, odynophagia, melena, hematochezia, dysuria, hematuria, rash, seizure activity, orthopnea, PND, pedal edema, claudication. Remaining systems are negative.  Physical Exam: Well-developed well-nourished in no acute distress.  Skin is warm and dry.  HEENT is  significant for recent removal of basal cell from upper lip Neck is supple. No thyromegaly.  Chest is clear to auscultation with normal expansion.  Cardiovascular exam is regular rate and rhythm. 2/6 systolic and diastolic murmur left sternal border. 2/6 systolic murmur apex Abdominal exam nontender or distended. No masses palpated. Extremities show no edema. neuro grossly intact  ECG 10/26/11 - Sinus with no significant ST changes

## 2011-10-27 NOTE — Patient Instructions (Signed)
Your physician has requested that you have an echocardiogram. Echocardiography is a painless test that uses sound waves to create images of your heart. It provides your doctor with information about the size and shape of your heart and how well your heart's chambers and valves are working. This procedure takes approximately one hour. There are no restrictions for this procedure. Friday December 21,12 at 11:30am   Your physician wants you to follow-up in: 6 months with Dr. Jens Som. You will receive a reminder letter in the mail two months in advance. If you don't receive a letter, please call our office to schedule the follow-up appointment.

## 2011-10-27 NOTE — Assessment & Plan Note (Signed)
Patient with history of aortic and mitral regurgitation. Conservative measures if possible given age. Repeat echocardiogram. She states she most likely would not consider surgery in the future.

## 2011-10-27 NOTE — Assessment & Plan Note (Signed)
Etiology of symptoms unclear. Some improvement with belching and may have been GI in etiology. We discussed stress test today. She would prefer conservative measures giving her age and I think this is reasonable. Can reconsider if she has recurrent symptoms. Note electrocardiogram in the emergency room showed no ST changes. 1 troponin negative.

## 2011-10-27 NOTE — Assessment & Plan Note (Signed)
Euvolemic on examination.Continue present dose of Lasix. 

## 2011-10-27 NOTE — Assessment & Plan Note (Signed)
Blood pressure controlled. Continue present medications. 

## 2011-10-28 ENCOUNTER — Ambulatory Visit (HOSPITAL_COMMUNITY): Payer: Medicare Other | Attending: Cardiovascular Disease

## 2011-10-28 DIAGNOSIS — I059 Rheumatic mitral valve disease, unspecified: Secondary | ICD-10-CM | POA: Insufficient documentation

## 2011-10-28 DIAGNOSIS — J4489 Other specified chronic obstructive pulmonary disease: Secondary | ICD-10-CM | POA: Insufficient documentation

## 2011-10-28 DIAGNOSIS — I509 Heart failure, unspecified: Secondary | ICD-10-CM | POA: Insufficient documentation

## 2011-10-28 DIAGNOSIS — J449 Chronic obstructive pulmonary disease, unspecified: Secondary | ICD-10-CM | POA: Insufficient documentation

## 2011-10-28 DIAGNOSIS — R079 Chest pain, unspecified: Secondary | ICD-10-CM | POA: Insufficient documentation

## 2011-10-28 DIAGNOSIS — I379 Nonrheumatic pulmonary valve disorder, unspecified: Secondary | ICD-10-CM | POA: Insufficient documentation

## 2011-10-28 DIAGNOSIS — I080 Rheumatic disorders of both mitral and aortic valves: Secondary | ICD-10-CM

## 2011-10-28 DIAGNOSIS — I359 Nonrheumatic aortic valve disorder, unspecified: Secondary | ICD-10-CM | POA: Insufficient documentation

## 2011-10-28 DIAGNOSIS — I079 Rheumatic tricuspid valve disease, unspecified: Secondary | ICD-10-CM | POA: Insufficient documentation

## 2011-10-28 DIAGNOSIS — I10 Essential (primary) hypertension: Secondary | ICD-10-CM | POA: Insufficient documentation

## 2011-11-16 DIAGNOSIS — M79609 Pain in unspecified limb: Secondary | ICD-10-CM | POA: Diagnosis not present

## 2011-11-16 DIAGNOSIS — B351 Tinea unguium: Secondary | ICD-10-CM | POA: Diagnosis not present

## 2011-11-22 ENCOUNTER — Ambulatory Visit: Payer: Medicare Other | Admitting: Cardiology

## 2011-12-30 DIAGNOSIS — I10 Essential (primary) hypertension: Secondary | ICD-10-CM | POA: Diagnosis not present

## 2011-12-30 DIAGNOSIS — E0590 Thyrotoxicosis, unspecified without thyrotoxic crisis or storm: Secondary | ICD-10-CM | POA: Diagnosis not present

## 2011-12-30 DIAGNOSIS — I509 Heart failure, unspecified: Secondary | ICD-10-CM | POA: Diagnosis not present

## 2011-12-30 DIAGNOSIS — R7301 Impaired fasting glucose: Secondary | ICD-10-CM | POA: Diagnosis not present

## 2012-03-02 DIAGNOSIS — Q150 Congenital glaucoma: Secondary | ICD-10-CM | POA: Diagnosis not present

## 2012-03-02 DIAGNOSIS — Z961 Presence of intraocular lens: Secondary | ICD-10-CM | POA: Diagnosis not present

## 2012-03-20 DIAGNOSIS — Z1231 Encounter for screening mammogram for malignant neoplasm of breast: Secondary | ICD-10-CM | POA: Diagnosis not present

## 2012-03-23 DIAGNOSIS — L0889 Other specified local infections of the skin and subcutaneous tissue: Secondary | ICD-10-CM | POA: Diagnosis not present

## 2012-04-24 ENCOUNTER — Encounter: Payer: Self-pay | Admitting: Cardiology

## 2012-04-24 ENCOUNTER — Ambulatory Visit (INDEPENDENT_AMBULATORY_CARE_PROVIDER_SITE_OTHER): Payer: Medicare Other | Admitting: Cardiology

## 2012-04-24 VITALS — BP 178/98 | HR 63 | Wt 133.0 lb

## 2012-04-24 DIAGNOSIS — I359 Nonrheumatic aortic valve disorder, unspecified: Secondary | ICD-10-CM | POA: Diagnosis not present

## 2012-04-24 DIAGNOSIS — I059 Rheumatic mitral valve disease, unspecified: Secondary | ICD-10-CM

## 2012-04-24 DIAGNOSIS — I509 Heart failure, unspecified: Secondary | ICD-10-CM

## 2012-04-24 DIAGNOSIS — I10 Essential (primary) hypertension: Secondary | ICD-10-CM | POA: Diagnosis not present

## 2012-04-24 DIAGNOSIS — I340 Nonrheumatic mitral (valve) insufficiency: Secondary | ICD-10-CM

## 2012-04-24 MED ORDER — ENALAPRIL MALEATE 20 MG PO TABS: 20.0000 mg | ORAL_TABLET | Freq: Two times a day (BID) | ORAL | Status: DC

## 2012-04-24 NOTE — Assessment & Plan Note (Signed)
And most recent echocardiogram shows moderate mitral regurgitation. Her LV function is preserved and she is not having significant symptoms. Conservative measures if possible given age.

## 2012-04-24 NOTE — Progress Notes (Signed)
HPI: Pleasant female for fu of aortic insufficiency, mitral insufficiency, and moderate to severe tricuspid regurgitation with moderate pulmonary hypertension. Last echocardiogram in Dec 2012 revealed normal LV function. Moderate to severe LAE. There was mitral valve prolapse with moderate mitral regurgitation. There was mild aortic insufficiency and moderate tricuspid regurgitation. Patient last seen in Dec 2012. Since then, she has some dyspnea on exertion which is unchanged. No orthopnea, PND, pedal edema, syncope or chest pain.   Current Outpatient Prescriptions  Medication Sig Dispense Refill  . aspirin 81 MG tablet Take 81 mg by mouth 3 (three) times a week. On mondays, Wednesday, and fridays      . Calcium Carbonate-Vitamin D (CALCIUM + D PO) Take 600 mg by mouth daily.        . citalopram (CELEXA) 20 MG tablet Take 20 mg by mouth daily.        . enalapril (VASOTEC) 10 MG tablet Take 20 mg by mouth daily.        . fish oil-omega-3 fatty acids 1000 MG capsule Take 2 g by mouth daily.       . furosemide (LASIX) 40 MG tablet Take 40 mg by mouth daily.        . methimazole (TAPAZOLE) 10 MG tablet Take 5 mg by mouth daily.       . Multiple Vitamin (MULITIVITAMIN WITH MINERALS) TABS Take 1 tablet by mouth daily.        . nadolol (CORGARD) 40 MG tablet Take 20 mg by mouth daily.       Marland Kitchen nystatin-triamcinolone ointment (MYCOLOG) as needed.      . potassium chloride (K-DUR) 10 MEQ tablet Take 10 mEq by mouth daily.        . zoledronic acid (RECLAST) 5 MG/100ML SOLN Inject 5 mg into the vein. Once every (2) two years          Past Medical History  Diagnosis Date  . Aortic insufficiency   . Mitral insufficiency   . Tricuspid regurgitation   . Pulmonary hypertension   . CHF (congestive heart failure)   . COPD (chronic obstructive pulmonary disease)   . Hypertension     Past Surgical History  Procedure Date  . Cataract extraction   . Breast lumpectomy   . Appendectomy     History     Social History  . Marital Status: Married    Spouse Name: N/A    Number of Children: N/A  . Years of Education: N/A   Occupational History  . retired    Social History Main Topics  . Smoking status: Never Smoker   . Smokeless tobacco: Never Used  . Alcohol Use: No  . Drug Use: No  . Sexually Active: Not on file   Other Topics Concern  . Not on file   Social History Narrative  . No narrative on file    ROS: arthralgias but no fevers or chills, productive cough, hemoptysis, dysphasia, odynophagia, melena, hematochezia, dysuria, hematuria, rash, seizure activity, orthopnea, PND, pedal edema, claudication. Remaining systems are negative.  Physical Exam: Well-developed well-nourished in no acute distress.  Skin is warm and dry.  HEENT is normal.  Neck is supple.  Chest with mildly diminished BS  Cardiovascular exam is regular rate and rhythm. 3/6 systolic murmur apex. Abdominal exam nontender or distended. No masses palpated. Extremities show no edema. neuro grossly intact  ECG sinus rhythm at a rate of 63. Occasional PVCs. First degree AV block. No significant ST changes

## 2012-04-24 NOTE — Assessment & Plan Note (Signed)
euvolemic on examination. Continue present dose of Lasix. 

## 2012-04-24 NOTE — Patient Instructions (Addendum)
Your physician wants you to follow-up in: 6 MONTHS WITH DR Jens Som You will receive a reminder letter in the mail two months in advance. If you don't receive a letter, please call our office to schedule the follow-up appointment.   INCREASE ENALAPRIL TO 20 MG TWICE DAILY  Your physician recommends that you return for lab work in: ONE WEEK=AFTER 8:30AM

## 2012-04-24 NOTE — Assessment & Plan Note (Signed)
Blood pressure elevated. Increase enalapril to 20 mg by mouth twice a day. Check potassium and renal function in one week.

## 2012-05-01 ENCOUNTER — Other Ambulatory Visit: Payer: Medicare Other

## 2012-05-03 ENCOUNTER — Ambulatory Visit (INDEPENDENT_AMBULATORY_CARE_PROVIDER_SITE_OTHER): Payer: Medicare Other | Admitting: *Deleted

## 2012-05-03 DIAGNOSIS — I071 Rheumatic tricuspid insufficiency: Secondary | ICD-10-CM

## 2012-05-03 DIAGNOSIS — I509 Heart failure, unspecified: Secondary | ICD-10-CM | POA: Diagnosis not present

## 2012-05-03 DIAGNOSIS — I351 Nonrheumatic aortic (valve) insufficiency: Secondary | ICD-10-CM

## 2012-05-03 DIAGNOSIS — I059 Rheumatic mitral valve disease, unspecified: Secondary | ICD-10-CM

## 2012-05-03 DIAGNOSIS — I10 Essential (primary) hypertension: Secondary | ICD-10-CM | POA: Diagnosis not present

## 2012-05-03 DIAGNOSIS — I079 Rheumatic tricuspid valve disease, unspecified: Secondary | ICD-10-CM

## 2012-05-03 DIAGNOSIS — J449 Chronic obstructive pulmonary disease, unspecified: Secondary | ICD-10-CM | POA: Diagnosis not present

## 2012-05-03 DIAGNOSIS — I359 Nonrheumatic aortic valve disorder, unspecified: Secondary | ICD-10-CM | POA: Diagnosis not present

## 2012-05-03 DIAGNOSIS — R7301 Impaired fasting glucose: Secondary | ICD-10-CM | POA: Diagnosis not present

## 2012-05-03 DIAGNOSIS — I340 Nonrheumatic mitral (valve) insufficiency: Secondary | ICD-10-CM

## 2012-05-03 LAB — BASIC METABOLIC PANEL
BUN: 18 mg/dL (ref 6–23)
Calcium: 9.3 mg/dL (ref 8.4–10.5)
Creatinine, Ser: 0.9 mg/dL (ref 0.4–1.2)
GFR: 65.35 mL/min (ref >60.00)
Glucose, Bld: 95 mg/dL (ref 70–99)

## 2012-05-11 DIAGNOSIS — R05 Cough: Secondary | ICD-10-CM | POA: Diagnosis not present

## 2012-05-11 DIAGNOSIS — I509 Heart failure, unspecified: Secondary | ICD-10-CM | POA: Diagnosis not present

## 2012-05-11 DIAGNOSIS — J069 Acute upper respiratory infection, unspecified: Secondary | ICD-10-CM | POA: Diagnosis not present

## 2012-05-11 DIAGNOSIS — J449 Chronic obstructive pulmonary disease, unspecified: Secondary | ICD-10-CM | POA: Diagnosis not present

## 2012-06-11 DIAGNOSIS — L821 Other seborrheic keratosis: Secondary | ICD-10-CM | POA: Diagnosis not present

## 2012-06-11 DIAGNOSIS — IMO0002 Reserved for concepts with insufficient information to code with codable children: Secondary | ICD-10-CM | POA: Diagnosis not present

## 2012-06-18 DIAGNOSIS — S90426A Blister (nonthermal), unspecified lesser toe(s), initial encounter: Secondary | ICD-10-CM | POA: Diagnosis not present

## 2012-06-18 DIAGNOSIS — I10 Essential (primary) hypertension: Secondary | ICD-10-CM | POA: Diagnosis not present

## 2012-06-18 DIAGNOSIS — L089 Local infection of the skin and subcutaneous tissue, unspecified: Secondary | ICD-10-CM | POA: Diagnosis not present

## 2012-07-03 DIAGNOSIS — M79609 Pain in unspecified limb: Secondary | ICD-10-CM | POA: Diagnosis not present

## 2012-07-03 DIAGNOSIS — S90129A Contusion of unspecified lesser toe(s) without damage to nail, initial encounter: Secondary | ICD-10-CM | POA: Diagnosis not present

## 2012-07-24 DIAGNOSIS — L2089 Other atopic dermatitis: Secondary | ICD-10-CM | POA: Diagnosis not present

## 2012-07-31 DIAGNOSIS — Z23 Encounter for immunization: Secondary | ICD-10-CM | POA: Diagnosis not present

## 2012-08-03 DIAGNOSIS — L2089 Other atopic dermatitis: Secondary | ICD-10-CM | POA: Diagnosis not present

## 2012-08-14 DIAGNOSIS — L2089 Other atopic dermatitis: Secondary | ICD-10-CM | POA: Diagnosis not present

## 2012-08-29 DIAGNOSIS — H401290 Low-tension glaucoma, unspecified eye, stage unspecified: Secondary | ICD-10-CM | POA: Diagnosis not present

## 2012-08-29 DIAGNOSIS — H409 Unspecified glaucoma: Secondary | ICD-10-CM | POA: Diagnosis not present

## 2012-09-12 ENCOUNTER — Encounter: Payer: Self-pay | Admitting: Cardiology

## 2012-09-12 DIAGNOSIS — R7301 Impaired fasting glucose: Secondary | ICD-10-CM | POA: Diagnosis not present

## 2012-09-12 DIAGNOSIS — E0590 Thyrotoxicosis, unspecified without thyrotoxic crisis or storm: Secondary | ICD-10-CM | POA: Diagnosis not present

## 2012-09-12 DIAGNOSIS — I10 Essential (primary) hypertension: Secondary | ICD-10-CM | POA: Diagnosis not present

## 2012-09-12 DIAGNOSIS — M899 Disorder of bone, unspecified: Secondary | ICD-10-CM | POA: Diagnosis not present

## 2012-09-18 ENCOUNTER — Other Ambulatory Visit: Payer: Self-pay | Admitting: Internal Medicine

## 2012-09-18 DIAGNOSIS — I10 Essential (primary) hypertension: Secondary | ICD-10-CM | POA: Diagnosis not present

## 2012-09-18 DIAGNOSIS — Z124 Encounter for screening for malignant neoplasm of cervix: Secondary | ICD-10-CM | POA: Diagnosis not present

## 2012-09-18 DIAGNOSIS — I509 Heart failure, unspecified: Secondary | ICD-10-CM | POA: Diagnosis not present

## 2012-09-18 DIAGNOSIS — E049 Nontoxic goiter, unspecified: Secondary | ICD-10-CM

## 2012-09-18 DIAGNOSIS — J449 Chronic obstructive pulmonary disease, unspecified: Secondary | ICD-10-CM | POA: Diagnosis not present

## 2012-09-18 DIAGNOSIS — Z0000 Encounter for general adult medical examination without abnormal findings: Secondary | ICD-10-CM | POA: Diagnosis not present

## 2012-09-20 DIAGNOSIS — Z1212 Encounter for screening for malignant neoplasm of rectum: Secondary | ICD-10-CM | POA: Diagnosis not present

## 2012-09-21 ENCOUNTER — Ambulatory Visit
Admission: RE | Admit: 2012-09-21 | Discharge: 2012-09-21 | Disposition: A | Discharge status: Home / Self Care | Payer: Medicare Other | Source: Ambulatory Visit | Attending: Internal Medicine | Admitting: Internal Medicine

## 2012-09-21 DIAGNOSIS — E042 Nontoxic multinodular goiter: Secondary | ICD-10-CM | POA: Diagnosis not present

## 2012-09-21 DIAGNOSIS — E049 Nontoxic goiter, unspecified: Secondary | ICD-10-CM

## 2012-10-11 DIAGNOSIS — I517 Cardiomegaly: Secondary | ICD-10-CM | POA: Diagnosis not present

## 2012-10-11 DIAGNOSIS — R911 Solitary pulmonary nodule: Secondary | ICD-10-CM | POA: Diagnosis not present

## 2012-10-12 ENCOUNTER — Other Ambulatory Visit (HOSPITAL_COMMUNITY): Payer: Self-pay | Admitting: Internal Medicine

## 2012-10-12 DIAGNOSIS — J984 Other disorders of lung: Secondary | ICD-10-CM

## 2012-10-23 ENCOUNTER — Ambulatory Visit (INDEPENDENT_AMBULATORY_CARE_PROVIDER_SITE_OTHER): Payer: Medicare Other | Admitting: Cardiology

## 2012-10-23 ENCOUNTER — Encounter: Payer: Self-pay | Admitting: Cardiology

## 2012-10-23 VITALS — BP 184/91 | HR 67 | Ht 65.0 in | Wt 130.0 lb

## 2012-10-23 DIAGNOSIS — I059 Rheumatic mitral valve disease, unspecified: Secondary | ICD-10-CM

## 2012-10-23 DIAGNOSIS — I10 Essential (primary) hypertension: Secondary | ICD-10-CM

## 2012-10-23 DIAGNOSIS — I509 Heart failure, unspecified: Secondary | ICD-10-CM | POA: Diagnosis not present

## 2012-10-23 DIAGNOSIS — I340 Nonrheumatic mitral (valve) insufficiency: Secondary | ICD-10-CM

## 2012-10-23 NOTE — Assessment & Plan Note (Signed)
Patient's blood pressure is elevated. However she follows this at home and it typically runs 130/80. Continue present medications and monitor.

## 2012-10-23 NOTE — Progress Notes (Signed)
HPI: Pleasant female for fu of aortic insufficiency, mitral insufficiency, and tricuspid regurgitation with moderate pulmonary hypertension. Last echocardiogram in Dec 2012 revealed normal LV function. Moderate to severe LAE. There was mitral valve prolapse with moderate mitral regurgitation. There was mild aortic insufficiency and moderate tricuspid regurgitation. Patient last seen in June of 2013. Since then, she has some dyspnea on exertion which is unchanged. No orthopnea, PND, pedal edema, syncope or chest pain.   Current Outpatient Prescriptions  Medication Sig Dispense Refill  . aspirin 81 MG tablet Take 81 mg by mouth 3 (three) times a week. On mondays, Wednesday, and fridays      . Calcium Carbonate-Vitamin D (CALCIUM + D PO) Take 600 mg by mouth daily.        . citalopram (CELEXA) 20 MG tablet Take 20 mg by mouth daily.        . enalapril (VASOTEC) 20 MG tablet Take 1 tablet (20 mg total) by mouth 2 (two) times daily.  60 tablet  12  . fish oil-omega-3 fatty acids 1000 MG capsule Take 2 g by mouth daily.       . furosemide (LASIX) 40 MG tablet Take 40 mg by mouth daily.        . methimazole (TAPAZOLE) 10 MG tablet Take 5 mg by mouth daily.       . Multiple Vitamin (MULITIVITAMIN WITH MINERALS) TABS Take 1 tablet by mouth daily.        . nadolol (CORGARD) 40 MG tablet Take 20 mg by mouth daily.       Marland Kitchen nystatin-triamcinolone ointment (MYCOLOG) as needed.      . potassium chloride (K-DUR) 10 MEQ tablet Take 10 mEq by mouth daily.        . zoledronic acid (RECLAST) 5 MG/100ML SOLN Inject 5 mg into the vein. Once every (2) two years          Past Medical History  Diagnosis Date  . Aortic insufficiency   . Mitral insufficiency   . Tricuspid regurgitation   . Pulmonary hypertension   . CHF (congestive heart failure)   . COPD (chronic obstructive pulmonary disease)   . Hypertension     Past Surgical History  Procedure Date  . Cataract extraction   . Breast lumpectomy   .  Appendectomy     History   Social History  . Marital Status: Married    Spouse Name: N/A    Number of Children: N/A  . Years of Education: N/A   Occupational History  . retired    Social History Main Topics  . Smoking status: Never Smoker   . Smokeless tobacco: Never Used  . Alcohol Use: No  . Drug Use: No  . Sexually Active: Not on file   Other Topics Concern  . Not on file   Social History Narrative  . No narrative on file    ROS: no fevers or chills, productive cough, hemoptysis, dysphasia, odynophagia, melena, hematochezia, dysuria, hematuria, rash, seizure activity, orthopnea, PND, pedal edema, claudication. Remaining systems are negative.  Physical Exam: Well-developed well-nourished in no acute distress.  Skin is warm and dry.  HEENT is normal.  Neck is supple.  Chest is clear to auscultation with normal expansion.  Cardiovascular exam is regular rate and rhythm. 2/6 diastolic murmur left border. 3/6 systolic murmur apex. Abdominal exam nontender or distended. No masses palpated. Extremities show no edema. neuro grossly intact  ECG sinus rhythm at a rate of 67. First degree AV  block. Left axis deviation. Lateral T-wave inversion.

## 2012-10-23 NOTE — Patient Instructions (Addendum)
Your physician wants you to follow-up in: 1 year. You will receive a reminder letter in the mail two months in advance. If you don't receive a letter, please call our office to schedule the follow-up appointment.  

## 2012-10-23 NOTE — Assessment & Plan Note (Signed)
Continue present dose of Lasix. Patient euvolemic on examination. Her laboratories are followed by her primary care physician.

## 2012-10-23 NOTE — Assessment & Plan Note (Signed)
Patient with valvular heart disease. However she is not symptomatic. Given her age would like to be conservative if possible. Plan repeat echocardiogram when she returns in one year.

## 2012-10-29 ENCOUNTER — Encounter (HOSPITAL_COMMUNITY): Payer: Self-pay

## 2012-10-29 ENCOUNTER — Encounter (HOSPITAL_COMMUNITY)
Admission: RE | Admit: 2012-10-29 | Discharge: 2012-10-29 | Disposition: A | Discharge status: Home / Self Care | Payer: Medicare Other | Source: Ambulatory Visit | Attending: Internal Medicine | Admitting: Internal Medicine

## 2012-10-29 DIAGNOSIS — R911 Solitary pulmonary nodule: Secondary | ICD-10-CM | POA: Insufficient documentation

## 2012-10-29 DIAGNOSIS — R918 Other nonspecific abnormal finding of lung field: Secondary | ICD-10-CM | POA: Insufficient documentation

## 2012-10-29 DIAGNOSIS — J984 Other disorders of lung: Secondary | ICD-10-CM | POA: Diagnosis not present

## 2012-10-29 MED ORDER — FLUDEOXYGLUCOSE F - 18 (FDG) INJECTION
14.3000 | Freq: Once | INTRAVENOUS | Status: AC | PRN
  Administered 2012-10-29: 14.3 via INTRAVENOUS

## 2012-11-15 ENCOUNTER — Encounter: Payer: Self-pay | Admitting: Surgery

## 2012-11-15 ENCOUNTER — Institutional Professional Consult (permissible substitution) (INDEPENDENT_AMBULATORY_CARE_PROVIDER_SITE_OTHER): Payer: Medicare Other | Admitting: Surgery

## 2012-11-15 VITALS — BP 151/79 | HR 80 | Temp 98.5°F | Resp 18 | Ht 65.0 in | Wt 130.0 lb

## 2012-11-15 DIAGNOSIS — R918 Other nonspecific abnormal finding of lung field: Secondary | ICD-10-CM

## 2012-11-18 ENCOUNTER — Encounter: Payer: Self-pay | Admitting: Surgery

## 2012-11-18 DIAGNOSIS — R918 Other nonspecific abnormal finding of lung field: Secondary | ICD-10-CM | POA: Insufficient documentation

## 2012-11-18 NOTE — Progress Notes (Signed)
[address]            [place] [address]          [phone]      PCP is Ezequiel Kayser, MD Referring Provider is Perini, Redge Gainer, MD  Chief Complaint  Patient presents with  . Lung Mass    Referral from Dr Waynard Edwards for surgical eval on Chest CT 10/11/12, PET Scan 10/29/12    HPI:  The patient is an 79 year old woman with significant multi-valvular heart disease that has been minimally symptomatic who has a history of multiple bilateral pulmonary nodules that have been followed for several years with CT scan. Her most recent CT scan dated 10/11/2012 showed a 1.6 cm groundglass opacity within the central aspect of the right upper lobe that was slightly more conspicuous and irregular than on prior studies in 2011 and 2012. The nodular opacities previously seen within the posterior lateral aspect of the right upper lobe were unchanged. A 5 mm nodule within the posterior aspect of the right lower lobe was stable. There were stable opacities within the right middle lobe and left lower lobe that were felt to be scar. She underwent a PET scan on 10/29/2012 which did not show any hypermetabolic uptake within the right upper lobe groundglass opacity which was measured at 13 mm. A somewhat spiculated 11 mm nodule in the superior segment of left lower lobe just posterior to the aorta showed low level hypermetabolic uptake with an SUV of 2.7.  This was not noted on the previous CT scan of 10/25/2011. Past Medical History  Diagnosis Date  . Aortic insufficiency   . Mitral insufficiency   . Tricuspid regurgitation   . Pulmonary hypertension   . CHF (congestive heart failure)   . COPD (chronic obstructive pulmonary disease)   . Hypertension   . Cataracts, bilateral   . CAD (coronary artery disease)   . Thyroid disease     Past Surgical History  Procedure Date  . Cataract extraction   . Breast lumpectomy   . Appendectomy   . Abdominal hysterectomy   . Eye  surgery     Family History  Problem Relation Age of Onset  . Heart attack Mother 42  . Pneumonia Father 72  . Heart attack Brother   . Leukemia Brother   . Heart attack Brother   . Leukemia Brother     Social History History  Substance Use Topics  . Smoking status: Never Smoker   . Smokeless tobacco: Never Used  . Alcohol Use: No    Current Outpatient Prescriptions  Medication Sig Dispense Refill  . aspirin 81 MG tablet Take 81 mg by mouth 3 (three) times a week. On mondays, Wednesday, and fridays      . Calcium Carbonate-Vitamin D (CALCIUM + D PO) Take 600 mg by mouth daily.        . citalopram (CELEXA) 20 MG tablet Take 20 mg by mouth daily.        . enalapril (VASOTEC) 20 MG tablet Take 1 tablet (20 mg total) by mouth 2 (two) times daily.  60 tablet  12  . fish oil-omega-3 fatty acids 1000 MG capsule Take 2 g by mouth daily.       . furosemide (LASIX) 40 MG tablet Take 40 mg by mouth daily.        . methimazole (TAPAZOLE) 10 MG  tablet Take 5 mg by mouth daily.       . Multiple Vitamin (MULITIVITAMIN WITH MINERALS) TABS Take 1 tablet by mouth daily.        . nadolol (CORGARD) 40 MG tablet Take 20 mg by mouth daily.       . potassium chloride (K-DUR) 10 MEQ tablet Take 10 mEq by mouth daily.        . zoledronic acid (RECLAST) 5 MG/100ML SOLN Inject 5 mg into the vein. Once every (2) two years         No Known Allergies  Review of Systems  Constitutional: Negative.   HENT: Negative.   Eyes: Negative.   Respiratory: Negative.   Cardiovascular: Negative.   Gastrointestinal: Negative.   Genitourinary: Negative.   Musculoskeletal: Negative.   Neurological: Negative.   Hematological: Negative.   Psychiatric/Behavioral: Negative.     BP 151/79  Pulse 80  Temp 98.5 F (36.9 C) (Oral)  Resp 18  Ht 5\' 5"  (1.651 m)  Wt 130 lb (58.968 kg)  BMI 21.63 kg/m2  SpO2 93% Physical Exam  Constitutional: She is oriented to person, place, and time. She appears well-developed  and well-nourished. No distress.  HENT:  Head: Normocephalic and atraumatic.  Mouth/Throat: Oropharynx is clear and moist.  Eyes: EOM are normal. Pupils are equal, round, and reactive to light.  Neck: Normal range of motion. Neck supple. No JVD present. No tracheal deviation present. Thyromegaly present.  Cardiovascular: Normal rate, regular rhythm and intact distal pulses.   Murmur heard.      2/6 diastolic murmur and 2/6 systolic murmur at apex.  Pulmonary/Chest: Effort normal and breath sounds normal. She has no rales.  Abdominal: Bowel sounds are normal. She exhibits no distension and no mass. There is no tenderness.  Musculoskeletal: She exhibits no edema.  Lymphadenopathy:    She has no cervical adenopathy.  Neurological: She is alert and oriented to person, place, and time. She has normal strength. No cranial nerve deficit or sensory deficit.  Skin: Skin is warm and dry.  Psychiatric: She has a normal mood and affect.     Diagnostic Tests:  *RADIOLOGY REPORT*   Clinical Data: Initial treatment strategy for lung nodule.   NUCLEAR MEDICINE PET SKULL BASE TO THIGH   Fasting Blood Glucose:  94   Technique:  14.3 mCi F-18 FDG was injected intravenously. CT data was obtained and used for attenuation correction and anatomic localization only.  (This was not acquired as a diagnostic CT examination.) Additional exam technical data entered on technologist worksheet.   Comparison:  Chest CT 10/25/2011.   Findings:   Neck: No hypermetabolic lymph nodes in the neck.   Chest:  The 13-mm right upper lobe ground-glass opacity does not show any FDG uptake.  This does not appear significantly different than the CT scan from 10/25/2011.  However, continued observation is recommended as a low grade adenocarcinoma is still possibility.   The somewhat spiculated appearing 11 mm nodule in the superior segment of the left lower lobe just posterior to the aorta shows low level FDG  uptake with SUV max of 2.7.  This is somewhat worrisome for neoplasm given its morphology and uptake for size. Recommend biopsy or close observation.  This was not present on the prior CT scan.   Patchy nodular scarring type changes are again demonstrated in the right middle lobe.  No other significant pulmonary findings. Stable scarring changes in the left lung base.   Abdomen/Pelvis:  No abnormal  hypermetabolic activity within the liver, pancreas, adrenal glands, or spleen.  No hypermetabolic lymph nodes in the abdomen or pelvis.   Skeleton:  No focal hypermetabolic activity to suggest skeletal metastasis.   IMPRESSION:   1.  11 mm nodule in the superior segment left lower lobe has low level FDG uptake with SUV max 2.7.  Lesion is concerning for neoplasm.  Recommend biopsy or close observation. 2.  13 mm ground-glass opacity in the right upper lobe does not demonstrate FDG uptake but recommend continued observation. 3.  Stable areas of pulmonary scarring. 4.  No mediastinal or hilar lymphadenopathy and no findings for metastatic disease.     Original Report Authenticated By: Rudie Meyer, M.D.  *RADIOLOGY REPORT*   Clinical Data:  Chest pain.   CT ANGIOGRAPHY CHEST WITH CONTRAST   Technique:  Multidetector CT imaging of the chest was performed using the standard protocol during bolus administration of intravenous contrast.  Multiplanar CT image reconstructions including MIPs were obtained to evaluate the vascular anatomy.   Contrast: OMNIPAQUE IOHEXOL 300 MG/ML IV SOLN   Comparison:  Chest x-ray dated 10/26/2011 and chest CT dated 03/19/2010   Findings:  There are no pulmonary emboli.  There are patchy areas of scarring at the lung bases and in the right upper lobe.  The small nodular density in the right lower lobe is less prominent than on the prior study and not felt to be significant.   There is increased cardiomegaly.  No effusions.  No acute  osseous abnormality.  Visualized portion of the upper abdomen demonstrates a large posterior gastric fundal diverticulum.   Review of the MIP images confirms the above findings.   IMPRESSION: No acute abnormalities.  No pulmonary emboli.  Chronic cardiomegaly, slightly increased.   Original Report Authenticated By: Gwynn Burly, M.D.    Impression:  She has an 11-16 mm groundglass opacity within the central part of the right upper lobe that was felt to be a little more conspicuous on a recent CT scan but has no hypermetabolic activity on PET scan. This could be a benign lesion or a low grade adenocarcinoma and will require continued followup. The new 11 mm spiculated nodule noted in the superior segment of the left lower lobe on PET scan had low level hypermetabolic uptake. This lesion was not specifically commented on in her most recent CT scan of 10/11/2012. This lesion is small and with her history of multiple bilateral pulmonary nodules it is just as likely to be a benign nodule as an early malignant lesion. Since she is 79 years old I think a conservative approach is warranted and I would not recommend biopsying any of these lesions at this time. I would recommend continue followup with a repeat low-dose CT scan the chest in 4 months to followup on this lesion.  Plan:  I will plan to see her back in the office in 4 months with a low-dose CT scan of the chest to followup on this new left lower lobe lung nodule as well as the previously noted groundglass opacity in the right upper lobe.

## 2013-01-25 ENCOUNTER — Other Ambulatory Visit: Payer: Self-pay

## 2013-01-25 DIAGNOSIS — D381 Neoplasm of uncertain behavior of trachea, bronchus and lung: Secondary | ICD-10-CM

## 2013-02-13 ENCOUNTER — Other Ambulatory Visit: Payer: Self-pay

## 2013-02-13 ENCOUNTER — Ambulatory Visit
Admission: RE | Admit: 2013-02-13 | Discharge: 2013-02-13 | Disposition: A | Discharge status: Home / Self Care | Payer: Medicare Other | Source: Ambulatory Visit | Attending: Surgery | Admitting: Surgery

## 2013-02-13 ENCOUNTER — Ambulatory Visit (INDEPENDENT_AMBULATORY_CARE_PROVIDER_SITE_OTHER): Payer: Medicare Other | Admitting: Surgery

## 2013-02-13 ENCOUNTER — Encounter: Payer: Self-pay | Admitting: Surgery

## 2013-02-13 VITALS — BP 148/85 | HR 73 | Resp 20 | Ht 65.0 in | Wt 130.0 lb

## 2013-02-13 DIAGNOSIS — R918 Other nonspecific abnormal finding of lung field: Secondary | ICD-10-CM

## 2013-02-13 DIAGNOSIS — D381 Neoplasm of uncertain behavior of trachea, bronchus and lung: Secondary | ICD-10-CM

## 2013-02-13 DIAGNOSIS — C3490 Malignant neoplasm of unspecified part of unspecified bronchus or lung: Secondary | ICD-10-CM | POA: Diagnosis not present

## 2013-02-13 NOTE — Progress Notes (Signed)
[address]       [place] [address]             [phone]      HPI:  The patient returns today for followup of a small groundglass opacity within the central part of the right upper lobe as well as an 11 mm spiculated nodule in the superior segment of the left lower lobe. She had a CT scan done in May of 2011 which showed significant patchy airspace disease particular in the right lung with a 6 mm right lower lobe lung nodule and bilateral pleural effusions. In retrospect the small groundglass opacity in the right upper lobe was probably present that time but not very conspicuous. She had a followup CT scan done in 12 /2012 which showed resolution of the pleural effusions and the right lung patchy airspace disease. There was a groundglass opacity in the central part of the right upper lobe. The patient had a followup PET scan done in 12/ 2013 which again showed the groundglass opacity in the right upper lobe which did not have hypermetabolic activity. It also showed a new 11 mm spiculated nodule in the superior segment of the left lower lobe which had low level hypermetabolic activity. Given her advanced age we decided to do a followup scan in about 4 months and her scan today shows that the overall size of the right upper lobe groundglass opacity is about the same, although the central solid component may be slightly larger. The nodular opacity in the left lower lobe has definitely increased in size since December. Overall she feels about the same. She does have some cough but no significant sputum production. She denies any hemoptysis.  Current Outpatient Prescriptions  Medication Sig Dispense Refill  . aspirin 81 MG tablet Take 81 mg by mouth 3 (three) times a week. On mondays, Wednesday, and fridays      . Calcium Carbonate-Vitamin D (CALCIUM + D PO) Take 600 mg by mouth daily.        . citalopram (CELEXA) 20 MG tablet Take 20 mg by mouth daily.        . enalapril  (VASOTEC) 20 MG tablet Take 1 tablet (20 mg total) by mouth 2 (two) times daily.  60 tablet  12  . fish oil-omega-3 fatty acids 1000 MG capsule Take 2 g by mouth daily.       . furosemide (LASIX) 40 MG tablet Take 40 mg by mouth daily.        . methimazole (TAPAZOLE) 10 MG tablet Take 5 mg by mouth daily.       . Multiple Vitamin (MULITIVITAMIN WITH MINERALS) TABS Take 1 tablet by mouth daily.        . nadolol (CORGARD) 40 MG tablet Take 20 mg by mouth daily.       . potassium chloride (K-DUR) 10 MEQ tablet Take 10 mEq by mouth daily.        . zoledronic acid (RECLAST) 5 MG/100ML SOLN Inject 5 mg into the vein. Once every (2) two years        No current facility-administered medications for this visit.     Physical Exam: BP 148/85  Pulse 73  Resp 20  Ht 5\' 5"  (1.651 m)  Wt 130 lb (58.968 kg)  BMI 21.63 kg/m2  SpO2 94% She looks well. There is no cervical or supraclavicular adenopathy. Lung exam is clear.   Diagnostic Tests:  *RADIOLOGY REPORT*   Clinical Data:  Follow-up multiple lung nodules.  Cough.   CT CHEST LOW DOSE PILOT WITHOUT CONTRAST   Technique: Multidetector CT imaging of the chest using the standard low-dose protocol without administration of intravenous contrast.   Comparison: PET 10/29/2012, CT chest 10/25/2011 and 03/19/2010.   Findings: Thyroid is heterogeneous and asymmetrically enlarged on the left.  Mediastinal lymph nodes measure up to 10 mm in the low right paratracheal station, as before.  Heart is at the upper limits of normal in size.  No pericardial effusion.   Mild biapical pleural parenchymal scarring.  There is an irregular mixed solid and ground-glass nodule in the apical segment right upper lobe, measuring 11 x 19 mm (solid component 5 x 7 mm, image 51).  While the overall size of the lesion is likely unchanged dating back to 04/05/2010, the solid component may be minimally more prominent (4 x 5 mm on 10/25/2011).   Probable post  infectious peribronchovascular nodularity and mild architectural distortion in the posterior segment right upper lobe. Scarring and volume loss in the right middle lobe and lingula.   Peripheral right lower lobe nodules measure 6 mm in the right lower lobe (images 130 and 141) and are likely stable from 10/25/2011.   Subpleural nodular air space consolidation in the superior segment left lower lobe (example image 68) measures 1.9 x 2.1 cm and has enlarged when compared with 10/29/2012, at which time a nodule measured 11 mm.  Linear scarring or atelectasis in the left lower lobe.  No pleural fluid.  Airway is unremarkable.   Incidental imaging of the upper abdomen shows no acute findings. No worrisome lytic or sclerotic lesions.   IMPRESSION:   1.  Mixed solid and ground-glass nodule in the right upper lobe is likely stable in overall size when compared with 03/19/2010. However, the solid component may be very minimally larger. 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 2. Nodular airspace consolidation in the superior segment left lower lobe, which likely corresponds to an 11 mm nodule seen on 10/29/2012, given differences in lung expansion. Finding is most consistent with progressive primary bronchogenic carcinoma.     Original Report Authenticated By: Leanna Battles, M.D.     Impression:  The small groundglass density in the right upper lobe remains suspicious for a slowly growing adenocarcinoma. The central solid component is slightly larger. The nodular density in the superior segment of the left lower lobe is definitely larger and remains very suspicious for a bronchogenic carcinoma. I think the lesion in the right upper lobe would be difficult to target by CT guided needle biopsy since it is relatively small and in the central part lobe. Her risk for pneumothorax would be significant and I think the yield on this biopsy would be fairly low. The lesion in the superior  segment of the left lower lobe is adjacent to the chest wall and is more amenable to CT guided needle biopsy. I think this would be worthwhile doing to see if this is cancer. I reviewed all the scans with the patient and her granddaughter. They understand the rationale for a biopsy and are in agreement.  Plan:  I will arrange CT-guided needle biopsy of the spiculated nodular density in the superior segment of the left lower lobe.

## 2013-02-14 ENCOUNTER — Encounter (HOSPITAL_COMMUNITY): Payer: Self-pay | Admitting: Pharmacy Technician

## 2013-02-15 ENCOUNTER — Other Ambulatory Visit: Payer: Self-pay | Admitting: Radiology

## 2013-02-18 ENCOUNTER — Ambulatory Visit (HOSPITAL_COMMUNITY)
Admission: RE | Admit: 2013-02-18 | Discharge: 2013-02-18 | Disposition: A | Discharge status: Home / Self Care | Payer: Medicare Other | Source: Ambulatory Visit | Attending: Interventional Radiology | Admitting: Interventional Radiology

## 2013-02-18 ENCOUNTER — Ambulatory Visit (HOSPITAL_COMMUNITY)
Admission: RE | Admit: 2013-02-18 | Discharge: 2013-02-18 | Disposition: A | Discharge status: Home / Self Care | Payer: Medicare Other | Source: Ambulatory Visit | Attending: Surgery | Admitting: Surgery

## 2013-02-18 ENCOUNTER — Encounter (HOSPITAL_COMMUNITY): Payer: Self-pay

## 2013-02-18 DIAGNOSIS — J8410 Pulmonary fibrosis, unspecified: Secondary | ICD-10-CM | POA: Insufficient documentation

## 2013-02-18 DIAGNOSIS — I079 Rheumatic tricuspid valve disease, unspecified: Secondary | ICD-10-CM | POA: Diagnosis not present

## 2013-02-18 DIAGNOSIS — I2510 Atherosclerotic heart disease of native coronary artery without angina pectoris: Secondary | ICD-10-CM | POA: Insufficient documentation

## 2013-02-18 DIAGNOSIS — R918 Other nonspecific abnormal finding of lung field: Secondary | ICD-10-CM | POA: Diagnosis not present

## 2013-02-18 DIAGNOSIS — D381 Neoplasm of uncertain behavior of trachea, bronchus and lung: Secondary | ICD-10-CM

## 2013-02-18 DIAGNOSIS — I509 Heart failure, unspecified: Secondary | ICD-10-CM | POA: Insufficient documentation

## 2013-02-18 DIAGNOSIS — I10 Essential (primary) hypertension: Secondary | ICD-10-CM | POA: Diagnosis not present

## 2013-02-18 DIAGNOSIS — J4489 Other specified chronic obstructive pulmonary disease: Secondary | ICD-10-CM | POA: Insufficient documentation

## 2013-02-18 DIAGNOSIS — E079 Disorder of thyroid, unspecified: Secondary | ICD-10-CM | POA: Diagnosis not present

## 2013-02-18 DIAGNOSIS — J449 Chronic obstructive pulmonary disease, unspecified: Secondary | ICD-10-CM | POA: Diagnosis not present

## 2013-02-18 DIAGNOSIS — I080 Rheumatic disorders of both mitral and aortic valves: Secondary | ICD-10-CM | POA: Diagnosis not present

## 2013-02-18 DIAGNOSIS — R911 Solitary pulmonary nodule: Secondary | ICD-10-CM | POA: Insufficient documentation

## 2013-02-18 LAB — CBC
HCT: 40.4 % (ref 36.0–46.0)
MCH: 29.7 pg (ref 26.0–34.0)
MCHC: 33.9 g/dL (ref 30.0–36.0)
MCV: 87.6 fL (ref 78.0–100.0)
Platelets: 192 10*3/uL (ref 150–400)
RDW: 13.8 % (ref 11.5–15.5)
WBC: 6.8 10*3/uL (ref 4.0–10.5)

## 2013-02-18 MED ORDER — SODIUM CHLORIDE 0.9 % IV SOLN
INTRAVENOUS | Status: DC
  Administered 2013-02-18: 08:00:00 via INTRAVENOUS

## 2013-02-18 MED ORDER — SODIUM CHLORIDE 0.9 % IV SOLN: INTRAVENOUS | Status: DC

## 2013-02-18 MED ORDER — FENTANYL CITRATE 0.05 MG/ML IJ SOLN
INTRAMUSCULAR | Status: AC
  Filled 2013-02-18: qty 2

## 2013-02-18 MED ORDER — HYDROCODONE-ACETAMINOPHEN 5-325 MG PO TABS: 1.0000 | ORAL_TABLET | ORAL | Status: DC | PRN

## 2013-02-18 MED ORDER — MIDAZOLAM HCL 2 MG/2ML IJ SOLN
INTRAMUSCULAR | Status: AC
  Filled 2013-02-18: qty 2

## 2013-02-18 NOTE — H&P (Signed)
Michelle Levine is an 79 y.o. female.   Chief Complaint: Known Bilat pulmonary nodules followed every 3 months per CT with Dr Darcus Austin Enlargement of left lung nodule and sent to Dr Laneta Simmers PET 12/13 shows + Left lung nodule; Neg Rt lung nodule Now scheduled for left lung mass biopsy HPI: Ao insuff; Tr regurg; HTN; CHF; COPD; Cataracts; thyroid dz  Past Medical History  Diagnosis Date  . Aortic insufficiency   . Mitral insufficiency   . Tricuspid regurgitation   . Pulmonary hypertension   . CHF (congestive heart failure)   . COPD (chronic obstructive pulmonary disease)   . Hypertension   . Cataracts, bilateral   . CAD (coronary artery disease)   . Thyroid disease     Past Surgical History  Procedure Laterality Date  . Cataract extraction    . Breast lumpectomy    . Appendectomy    . Abdominal hysterectomy    . Eye surgery      Family History  Problem Relation Age of Onset  . Heart attack Mother 74  . Pneumonia Father 21  . Heart attack Brother   . Leukemia Brother   . Heart attack Brother   . Leukemia Brother    Social History:  reports that she has never smoked. She has never used smokeless tobacco. She reports that she does not drink alcohol or use illicit drugs.  Allergies: No Known Allergies   (Not in a hospital admission)  Results for orders placed during the hospital encounter of 02/18/13 (from the past 48 hour(s))  APTT     Status: None   Collection Time    02/18/13  7:28 AM      Result Value Range   aPTT 29  24 - 37 seconds  CBC     Status: None   Collection Time    02/18/13  7:28 AM      Result Value Range   WBC 6.8  4.0 - 10.5 K/uL   RBC 4.61  3.87 - 5.11 MIL/uL   Hemoglobin 13.7  12.0 - 15.0 g/dL   HCT 45.4  09.8 - 11.9 %   MCV 87.6  78.0 - 100.0 fL   MCH 29.7  26.0 - 34.0 pg   MCHC 33.9  30.0 - 36.0 g/dL   RDW 14.7  82.9 - 56.2 %   Platelets 192  150 - 400 K/uL  PROTIME-INR     Status: None   Collection Time    02/18/13  7:28 AM   Result Value Range   Prothrombin Time 13.8  11.6 - 15.2 seconds   INR 1.07  0.00 - 1.49   No results found.  Review of Systems  Constitutional: Negative for fever and weight loss.  Respiratory: Negative for cough, sputum production and shortness of breath.   Cardiovascular: Negative for chest pain and palpitations.  Gastrointestinal: Negative for nausea and vomiting.  Musculoskeletal: Negative for back pain.  Neurological: Negative for weakness and headaches.  Psychiatric/Behavioral: The patient is not nervous/anxious.     Blood pressure 178/99, pulse 62, temperature 97.6 F (36.4 C), temperature source Oral, resp. rate 18, height 5\' 5"  (1.651 m), weight 138 lb (62.596 kg), SpO2 96.00%. Physical Exam  Constitutional: She is oriented to person, place, and time. She appears well-developed.  Cardiovascular: Normal rate and regular rhythm.   Murmur heard. Valve click  Respiratory: Effort normal and breath sounds normal. She has no wheezes.  GI: Soft. Bowel sounds are normal. There is no tenderness.  Musculoskeletal: Normal range of motion.  Neurological: She is alert and oriented to person, place, and time.  Skin: Skin is warm.  Psychiatric: She has a normal mood and affect. Her behavior is normal. Judgment and thought content normal.     Assessment/Plan Enlarging and +PET Left lung mass Scheduled for biopsy per Dr Laneta Simmers Pt and family aware of procedure benefits and risks and agreeable to proceed Consent signed and in chart  Subhan Hoopes A 02/18/2013, 8:59 AM

## 2013-02-18 NOTE — Procedures (Signed)
CT LLL nodule biopsy 18g x2 No complication, no ptx. No blood loss. See complete dictation in [facility].

## 2013-02-20 ENCOUNTER — Telehealth: Payer: Self-pay | Admitting: *Deleted

## 2013-02-20 NOTE — Telephone Encounter (Signed)
Mrs. Norton had requested the results of her 02/18/13 lung biopsy to be called to her and the results also sent to her PCP, Dr. Cleora Fleet.  This was done today and she will see Dr. Laneta Simmers for follow up on 02/28/13.

## 2013-02-27 ENCOUNTER — Ambulatory Visit (INDEPENDENT_AMBULATORY_CARE_PROVIDER_SITE_OTHER): Payer: Medicare Other | Admitting: Surgery

## 2013-02-27 ENCOUNTER — Encounter: Payer: Self-pay | Admitting: Surgery

## 2013-02-27 VITALS — BP 119/75 | HR 75 | Resp 18 | Ht 65.0 in | Wt 138.0 lb

## 2013-02-27 DIAGNOSIS — R918 Other nonspecific abnormal finding of lung field: Secondary | ICD-10-CM | POA: Diagnosis not present

## 2013-02-27 DIAGNOSIS — Z9889 Other specified postprocedural states: Secondary | ICD-10-CM | POA: Diagnosis not present

## 2013-02-28 ENCOUNTER — Encounter: Payer: Self-pay | Admitting: Surgery

## 2013-02-28 DIAGNOSIS — H409 Unspecified glaucoma: Secondary | ICD-10-CM | POA: Diagnosis not present

## 2013-02-28 DIAGNOSIS — H401290 Low-tension glaucoma, unspecified eye, stage unspecified: Secondary | ICD-10-CM | POA: Diagnosis not present

## 2013-02-28 DIAGNOSIS — Q150 Congenital glaucoma: Secondary | ICD-10-CM | POA: Diagnosis not present

## 2013-02-28 NOTE — Progress Notes (Signed)
[address]       [place] [address]             [phone]      HPI:  The patient returns today for followup of a small groundglass opacity within the central part of the right upper lobe as well as an 11 mm spiculated nodule in the superior segment of the left lower lobe. She had a CT scan done in May of 2011 which showed significant patchy airspace disease particular in the right lung with a 6 mm right lower lobe lung nodule and bilateral pleural effusions. In retrospect the small groundglass opacity in the right upper lobe was probably present that time but not very conspicuous. She had a followup CT scan done in 12 /2012 which showed resolution of the pleural effusions and the right lung patchy airspace disease. There was a groundglass opacity in the central part of the right upper lobe. The patient had a followup PET scan done in 12/ 2013 which again showed the groundglass opacity in the right upper lobe which did not have hypermetabolic activity. It also showed a new 11 mm spiculated nodule in the superior segment of the left lower lobe which had low level hypermetabolic activity. Given her advanced age we decided to do a followup scan in about 4 months and her follow-up scan showed that the overall size of the right upper lobe groundglass opacity was about the same, although the central solid component may have been slightly larger. The nodular opacity in the left lower lobe has definitely increased in size since December. She underwent needle biopsy of the left lung nodule and the pathology shows benign lung with organized fibrosis and a mixed chronic inflammatory infiltrate with no sign of malignancy.     Current Outpatient Prescriptions  Medication Sig Dispense Refill  . aspirin EC 81 MG tablet Take 81 mg by mouth 3 (three) times a week. Monday, Wednesday and Friday      . Calcium Carbonate-Vitamin D (CALCIUM + D PO) Take 1 capsule by mouth daily.       . citalopram  (CELEXA) 20 MG tablet Take 20 mg by mouth daily.        . enalapril (VASOTEC) 20 MG tablet Take 1 tablet (20 mg total) by mouth 2 (two) times daily.  60 tablet  12  . fish oil-omega-3 fatty acids 1000 MG capsule Take 2 g by mouth every evening.       . furosemide (LASIX) 40 MG tablet Take 40 mg by mouth daily.        . methimazole (TAPAZOLE) 10 MG tablet Take 5 mg by mouth daily.       . Multiple Vitamin (MULITIVITAMIN WITH MINERALS) TABS Take 1 tablet by mouth daily.        . nadolol (CORGARD) 40 MG tablet Take 20 mg by mouth daily.       . potassium chloride (K-DUR) 10 MEQ tablet Take 10 mEq by mouth daily.        . zoledronic acid (RECLAST) 5 MG/100ML SOLN Inject 5 mg into the vein. Once every (2) two years - last dose in 2012? (done at [facility] [address])       No current facility-administered medications for this visit.     Physical Exam: BP 119/75  Pulse 75  Resp 18  Ht 5\' 5"  (1.651 m)  Wt 138 lb (62.596 kg)  BMI 22.96 kg/m2  SpO2 91% She looks well. Lungs  are clear.  Diagnostic Tests:  Pathology as noted above  Impression:  The lesion in the left lower lobe appears to be benign chronic inflammation. The etiology is unclear. She does report a chronic cough productive of some clear sputum. I am still concerned about the right upper lobe lesion that has progressed slightly since it was discovered in 2011. It is spiculated but has no hypermetabolic activity on PET scan. I think it should be followed with a repeat CT scan in 6 months.   Plan: She will have a repeat CT scan in 6 months and will see me at that time. If she continues to have a productive cough she may need a trial of antibiotics.

## 2013-03-18 DIAGNOSIS — R413 Other amnesia: Secondary | ICD-10-CM | POA: Diagnosis not present

## 2013-03-18 DIAGNOSIS — J984 Other disorders of lung: Secondary | ICD-10-CM | POA: Diagnosis not present

## 2013-03-18 DIAGNOSIS — R7301 Impaired fasting glucose: Secondary | ICD-10-CM | POA: Diagnosis not present

## 2013-03-18 DIAGNOSIS — I509 Heart failure, unspecified: Secondary | ICD-10-CM | POA: Diagnosis not present

## 2013-03-18 DIAGNOSIS — E0590 Thyrotoxicosis, unspecified without thyrotoxic crisis or storm: Secondary | ICD-10-CM | POA: Diagnosis not present

## 2013-03-18 DIAGNOSIS — I10 Essential (primary) hypertension: Secondary | ICD-10-CM | POA: Diagnosis not present

## 2013-03-21 DIAGNOSIS — Z1231 Encounter for screening mammogram for malignant neoplasm of breast: Secondary | ICD-10-CM | POA: Diagnosis not present

## 2013-03-22 DIAGNOSIS — L2089 Other atopic dermatitis: Secondary | ICD-10-CM | POA: Diagnosis not present

## 2013-05-27 DIAGNOSIS — Z23 Encounter for immunization: Secondary | ICD-10-CM | POA: Diagnosis not present

## 2013-06-04 DIAGNOSIS — R413 Other amnesia: Secondary | ICD-10-CM | POA: Diagnosis not present

## 2013-06-04 DIAGNOSIS — I10 Essential (primary) hypertension: Secondary | ICD-10-CM | POA: Diagnosis not present

## 2013-07-10 ENCOUNTER — Telehealth: Payer: Self-pay | Admitting: Cardiology

## 2013-07-10 NOTE — Telephone Encounter (Signed)
New Problwm  Pt having tightness in chest// made appt for 07/11/2013

## 2013-07-10 NOTE — Telephone Encounter (Signed)
Spoke with pt, yesterday she had an episode of chest tightness that lasted one hour and then again last night had another episode. There was nothing she could do to make it better or worse. She just rested until it went away. Today she has been fine. She wanted to be seen tomorrow. Pt voiced understanding to go to the ER tonight for any extended discomfort.

## 2013-07-11 ENCOUNTER — Encounter: Payer: Self-pay | Admitting: Cardiology

## 2013-07-11 ENCOUNTER — Ambulatory Visit (INDEPENDENT_AMBULATORY_CARE_PROVIDER_SITE_OTHER): Payer: Medicare Other | Admitting: Cardiology

## 2013-07-11 DIAGNOSIS — I10 Essential (primary) hypertension: Secondary | ICD-10-CM

## 2013-07-11 DIAGNOSIS — I359 Nonrheumatic aortic valve disorder, unspecified: Secondary | ICD-10-CM

## 2013-07-11 DIAGNOSIS — R079 Chest pain, unspecified: Secondary | ICD-10-CM | POA: Diagnosis not present

## 2013-07-11 DIAGNOSIS — I059 Rheumatic mitral valve disease, unspecified: Secondary | ICD-10-CM | POA: Diagnosis not present

## 2013-07-11 DIAGNOSIS — I351 Nonrheumatic aortic (valve) insufficiency: Secondary | ICD-10-CM

## 2013-07-11 DIAGNOSIS — I340 Nonrheumatic mitral (valve) insufficiency: Secondary | ICD-10-CM

## 2013-07-11 NOTE — Assessment & Plan Note (Signed)
Repeat echocardiogram. 

## 2013-07-11 NOTE — Patient Instructions (Addendum)

## 2013-07-11 NOTE — Assessment & Plan Note (Signed)
Plan repeat echocardiogram.conservative measures if possible given age.

## 2013-07-11 NOTE — Progress Notes (Signed)
HPI: FU aortic insufficiency, mitral insufficiency, and tricuspid regurgitation with moderate pulmonary hypertension. Last echocardiogram in Dec 2012 revealed normal LV function. Moderate to severe LAE. There was mitral valve prolapse with moderate mitral regurgitation. There was mild aortic insufficiency and moderate tricuspid regurgitation. Patient last seen in Dec of 2013. Since then, she has noticed some dyspnea on exertion which is unchanged. No orthopnea, PND, pedal edema or syncope. 3 or 4 days ago she had recent chest tightness with eating. It was nonexertional. No associated symptoms. She has not had exertional chest pain.  Current Outpatient Prescriptions  Medication Sig Dispense Refill  . aspirin EC 81 MG tablet Take 81 mg by mouth 3 (three) times a week. Monday, Wednesday and Friday      . Calcium Carbonate-Vitamin D (CALCIUM + D PO) Take 1 capsule by mouth daily.       . citalopram (CELEXA) 20 MG tablet Take 20 mg by mouth daily.        . enalapril (VASOTEC) 20 MG tablet Take 1 tablet (20 mg total) by mouth 2 (two) times daily.  60 tablet  12  . fish oil-omega-3 fatty acids 1000 MG capsule Take 2 g by mouth every evening.       . furosemide (LASIX) 40 MG tablet Take 40 mg by mouth daily.        . methimazole (TAPAZOLE) 10 MG tablet Take 5 mg by mouth daily.       . Multiple Vitamin (MULITIVITAMIN WITH MINERALS) TABS Take 1 tablet by mouth daily.        . nadolol (CORGARD) 40 MG tablet Take 20 mg by mouth daily.       . potassium chloride (K-DUR) 10 MEQ tablet Take 10 mEq by mouth daily.        . zoledronic acid (RECLAST) 5 MG/100ML SOLN Inject 5 mg into the vein. Once every (2) two years - last dose in 2012? (done at [facility])       No current facility-administered medications for this visit.     Past Medical History  Diagnosis Date  . Aortic insufficiency   . Mitral insufficiency   . Tricuspid regurgitation   . Pulmonary hypertension   . CHF (congestive heart  failure)   . COPD (chronic obstructive pulmonary disease)   . Hypertension   . Cataracts, bilateral   . CAD (coronary artery disease)   . Thyroid disease     Past Surgical History  Procedure Laterality Date  . Cataract extraction    . Breast lumpectomy    . Appendectomy    . Abdominal hysterectomy    . Eye surgery      History   Social History  . Marital Status: Married    Spouse Name: N/A    Number of Children: N/A  . Years of Education: N/A   Occupational History  . retired    Social History Main Topics  . Smoking status: Never Smoker   . Smokeless tobacco: Never Used  . Alcohol Use: No  . Drug Use: No  . Sexual Activity: Not on file   Other Topics Concern  . Not on file   Social History Narrative  . No narrative on file    ROS: no fevers or chills, productive cough, hemoptysis, dysphasia, odynophagia, melena, hematochezia, dysuria, hematuria, rash, seizure activity, orthopnea, PND, pedal edema, claudication. Remaining systems are negative.  Physical Exam: Well-developed well-nourished in no acute distress.  Skin is warm and dry.  HEENT  is normal.  Neck is supple.  Chest is clear to auscultation with normal expansion.  Cardiovascular exam is regular rate and rhythm. 3/6 systolic murmur apex. Abdominal exam nontender or distended. No masses palpated. Extremities show no edema. neuro grossly intact  ECG sinus rhythm with first degree AV block. At the anterior fascicular block. Lateral T-wave inversion

## 2013-07-11 NOTE — Assessment & Plan Note (Signed)
Symptoms somewhat atypical. We discussed a nuclear study today but she declined. She felt given her age conservative measures would be appropriate. I agree with this.

## 2013-07-11 NOTE — Assessment & Plan Note (Signed)
Blood pressure is mildly elevated. However she follows this closely at home and it is typically controlled. Continue present medications.

## 2013-07-17 ENCOUNTER — Ambulatory Visit (HOSPITAL_COMMUNITY): Payer: Medicare Other | Attending: Cardiology

## 2013-07-17 ENCOUNTER — Telehealth: Payer: Self-pay | Admitting: Cardiology

## 2013-07-17 DIAGNOSIS — I079 Rheumatic tricuspid valve disease, unspecified: Secondary | ICD-10-CM | POA: Diagnosis not present

## 2013-07-17 DIAGNOSIS — I10 Essential (primary) hypertension: Secondary | ICD-10-CM | POA: Diagnosis not present

## 2013-07-17 DIAGNOSIS — I379 Nonrheumatic pulmonary valve disorder, unspecified: Secondary | ICD-10-CM | POA: Diagnosis not present

## 2013-07-17 DIAGNOSIS — J449 Chronic obstructive pulmonary disease, unspecified: Secondary | ICD-10-CM | POA: Diagnosis not present

## 2013-07-17 DIAGNOSIS — I351 Nonrheumatic aortic (valve) insufficiency: Secondary | ICD-10-CM

## 2013-07-17 DIAGNOSIS — I2510 Atherosclerotic heart disease of native coronary artery without angina pectoris: Secondary | ICD-10-CM | POA: Insufficient documentation

## 2013-07-17 DIAGNOSIS — I2789 Other specified pulmonary heart diseases: Secondary | ICD-10-CM | POA: Diagnosis not present

## 2013-07-17 DIAGNOSIS — I080 Rheumatic disorders of both mitral and aortic valves: Secondary | ICD-10-CM | POA: Insufficient documentation

## 2013-07-17 DIAGNOSIS — J4489 Other specified chronic obstructive pulmonary disease: Secondary | ICD-10-CM | POA: Insufficient documentation

## 2013-07-17 DIAGNOSIS — I359 Nonrheumatic aortic valve disorder, unspecified: Secondary | ICD-10-CM

## 2013-07-17 DIAGNOSIS — I059 Rheumatic mitral valve disease, unspecified: Secondary | ICD-10-CM

## 2013-07-17 DIAGNOSIS — I509 Heart failure, unspecified: Secondary | ICD-10-CM | POA: Diagnosis not present

## 2013-07-17 DIAGNOSIS — R079 Chest pain, unspecified: Secondary | ICD-10-CM | POA: Diagnosis not present

## 2013-07-17 NOTE — Telephone Encounter (Signed)
Returning nurse call.

## 2013-07-17 NOTE — Progress Notes (Signed)
Echocardiogram performed.  

## 2013-07-18 ENCOUNTER — Telehealth: Payer: Self-pay | Admitting: *Deleted

## 2013-07-18 NOTE — Telephone Encounter (Signed)
Notified of echo results.

## 2013-07-19 NOTE — Telephone Encounter (Signed)
Called wanting to know Drs exact words on Echo and EKG results.  Advised that he said that Echo was good, normal EF, and EKG was normal.  She verbalized understanding.

## 2013-07-19 NOTE — Telephone Encounter (Signed)
New Problem  What were the results from the two procedures// pt request the Dr.'s words verses the nurse.

## 2013-08-02 ENCOUNTER — Other Ambulatory Visit: Payer: Self-pay | Admitting: *Deleted

## 2013-08-02 DIAGNOSIS — R918 Other nonspecific abnormal finding of lung field: Secondary | ICD-10-CM

## 2013-09-04 ENCOUNTER — Ambulatory Visit (INDEPENDENT_AMBULATORY_CARE_PROVIDER_SITE_OTHER): Payer: Medicare Other | Admitting: Surgery

## 2013-09-04 ENCOUNTER — Ambulatory Visit
Admission: RE | Admit: 2013-09-04 | Discharge: 2013-09-04 | Disposition: A | Discharge status: Home / Self Care | Payer: Medicare Other | Source: Ambulatory Visit | Attending: Surgery | Admitting: Surgery

## 2013-09-04 ENCOUNTER — Encounter: Payer: Self-pay | Admitting: Surgery

## 2013-09-04 VITALS — BP 113/78 | HR 74 | Resp 17 | Ht 65.0 in | Wt 128.0 lb

## 2013-09-04 DIAGNOSIS — J984 Other disorders of lung: Secondary | ICD-10-CM | POA: Diagnosis not present

## 2013-09-04 DIAGNOSIS — R918 Other nonspecific abnormal finding of lung field: Secondary | ICD-10-CM

## 2013-09-04 DIAGNOSIS — R911 Solitary pulmonary nodule: Secondary | ICD-10-CM

## 2013-09-04 NOTE — Progress Notes (Signed)
[address]       Michelle Levine [address]             [phone]        HPI:  The patient returns today for followup of a small groundglass opacity within the central part of the right upper lobe as well as an 11 mm spiculated nodule in the superior segment of the left lower lobe. She had a CT scan done in May of 2011 which showed significant patchy airspace disease particular in the right lung with a 6 mm right lower lobe lung nodule and bilateral pleural effusions. In retrospect the small groundglass opacity in the right upper lobe was probably present that time but not very conspicuous. She had a followup CT scan done in 12 /2012 which showed resolution of the pleural effusions and the right lung patchy airspace disease. There was a groundglass opacity in the central part of the right upper lobe. The patient had a followup PET scan done in 12/ 2013 which again showed the groundglass opacity in the right upper lobe which did not have hypermetabolic activity. It also showed a new 11 mm spiculated nodule in the superior segment of the left lower lobe which had low level hypermetabolic activity. Given her advanced age we decided to do a followup scan in about 4 months and her follow-up scan showed that the overall size of the right upper lobe groundglass opacity was about the same, although the central solid component may have been slightly larger. The nodular opacity in the left lower lobe has definitely increased in size since December. She underwent needle biopsy of the left lung nodule and the pathology shows benign lung with organized fibrosis and a mixed chronic inflammatory infiltrate with no sign of malignancy. The lesion in the left lower lobe appeared to be benign chronic inflammation of unclear etiology. I was still concerned about the right upper lobe lesion that progressed slightly since 2011 but had no hypermetabolic activity on PET scan. We decided to do a follow-up CT  scan in 6 months.      Current Outpatient Prescriptions  Medication Sig Dispense Refill  . aspirin EC 81 MG tablet Take 81 mg by mouth 3 (three) times a week. Monday, Wednesday and Friday      . Calcium Carbonate-Vitamin D (CALCIUM + D PO) Take 1 capsule by mouth daily.       . citalopram (CELEXA) 20 MG tablet Take 20 mg by mouth daily.        . enalapril (VASOTEC) 20 MG tablet Take 1 tablet (20 mg total) by mouth 2 (two) times daily.  60 tablet  12  . fish oil-omega-3 fatty acids 1000 MG capsule Take 2 g by mouth every evening.       . furosemide (LASIX) 40 MG tablet Take 40 mg by mouth daily.        . methimazole (TAPAZOLE) 10 MG tablet Take 5 mg by mouth daily.       . Multiple Vitamin (MULITIVITAMIN WITH MINERALS) TABS Take 1 tablet by mouth daily.        . nadolol (CORGARD) 40 MG tablet Take 20 mg by mouth daily.       . potassium chloride (K-DUR) 10 MEQ tablet Take 10 mEq by mouth daily.        . zoledronic acid (RECLAST) 5 MG/100ML SOLN Inject 5 mg into the vein. Once every (2) two years - last dose in  2012? (done at [facility])       No current facility-administered medications for this visit.     Physical Exam: BP 113/78  Pulse 74  Resp 17  Ht 5\' 5"  (1.651 m)  Wt 128 lb (58.06 kg)  BMI 21.3 kg/m2  SpO2 93% She looks well There is no cervical or supraclavicular adenopathy Lung exam is clear   Diagnostic Tests:  CLINICAL DATA: Followup of lung nodules, productive cough  EXAM:  CT CHEST WITHOUT CONTRAST  TECHNIQUE:  Multidetector CT imaging of the chest was performed following the  standard protocol without IV contrast.  COMPARISON: Chest x-ray of 02/18/2013 and CT chest of 02/13/2013  FINDINGS:  The mixed ground-glass and solid opacity within the right upper lobe  has not changed significantly measuring 16 mm in maximum diameter.  This remains suspicious for a slow growing adenocarcinoma continued  followup is recommended. The peribronchial vascular  nodularity  within the right upper lobe and right middle lobe is stable with  some mild bronchiectatic change and scarring in the right mid lobe,  changes consistent with prior inflammatory process, possibly  atypical. Linear scarring in the left lower lobe is stable. The  pleural-based opacity in the superior segment of the left lower lobe  postero- medially has diminished somewhat in size and most likely is  inflammatory in nature. No new focal infiltrate or effusion is seen.  Two small nodules in the right lower lobe are stable.  On soft tissue window images, asymmetric enlargement of the left  lobe of thyroid is noted with a low-attenuation nodule present  within the enlarged left lobe which appears inhomogeneous. On this  unenhanced study, no mediastinal or hilar adenopathy is seen.  Cardiomegaly is stable. Mild degenerative change for age is noted  throughout the thoracic spine.  IMPRESSION:  1. Apparent stability of the mixed solid and ground-glass opacity  within the right upper lobe when compared to CTs dating back to  2011. However a slow-growing adenocarcinoma remains a concern and  continued followup CT of the chest is recommended.  2. Stable chronic changes with peribronchial vascular nodularity,  some bronchiectatic change and scarring most consistent with  inflammatory process, possibly atypical.  3. Decrease in pleural-based opacity within the posterior medial  superior segment of the left lower lobe consistent with an  inflammatory process.  4. Stable asymmetrically enlarged thyroid most consistent with  asymmetrical goiter.  Electronically Signed  By: Dwyane Dee M.D.  On: 09/04/2013 10:07   Impression:  The left lower lobe lesion is decreasing in size and is most likely a benign inflammatory lesion. The right upper lobe lesion is stable but could be a slowly growing cancer. She is 79 years old but in good overall condition so I think it is worth continuing to  follow this lesion. If it increases in size then we will consider biopsy or removal depending upon how she is doing.  Plan:  I will see her back in 6 months with a CT scan of the chest.

## 2013-09-17 DIAGNOSIS — H401290 Low-tension glaucoma, unspecified eye, stage unspecified: Secondary | ICD-10-CM | POA: Diagnosis not present

## 2013-09-17 DIAGNOSIS — H409 Unspecified glaucoma: Secondary | ICD-10-CM | POA: Diagnosis not present

## 2013-10-16 DIAGNOSIS — M899 Disorder of bone, unspecified: Secondary | ICD-10-CM | POA: Diagnosis not present

## 2013-10-16 DIAGNOSIS — I10 Essential (primary) hypertension: Secondary | ICD-10-CM | POA: Diagnosis not present

## 2013-10-16 DIAGNOSIS — R7301 Impaired fasting glucose: Secondary | ICD-10-CM | POA: Diagnosis not present

## 2013-10-16 DIAGNOSIS — E0590 Thyrotoxicosis, unspecified without thyrotoxic crisis or storm: Secondary | ICD-10-CM | POA: Diagnosis not present

## 2013-10-22 DIAGNOSIS — M899 Disorder of bone, unspecified: Secondary | ICD-10-CM | POA: Diagnosis not present

## 2013-10-24 ENCOUNTER — Other Ambulatory Visit (HOSPITAL_COMMUNITY): Payer: Self-pay | Admitting: Internal Medicine

## 2013-10-24 DIAGNOSIS — J984 Other disorders of lung: Secondary | ICD-10-CM | POA: Diagnosis not present

## 2013-10-24 DIAGNOSIS — R413 Other amnesia: Secondary | ICD-10-CM | POA: Diagnosis not present

## 2013-10-24 DIAGNOSIS — E041 Nontoxic single thyroid nodule: Secondary | ICD-10-CM | POA: Diagnosis not present

## 2013-10-24 DIAGNOSIS — I509 Heart failure, unspecified: Secondary | ICD-10-CM | POA: Diagnosis not present

## 2013-10-24 DIAGNOSIS — Z1331 Encounter for screening for depression: Secondary | ICD-10-CM | POA: Diagnosis not present

## 2013-10-24 DIAGNOSIS — J449 Chronic obstructive pulmonary disease, unspecified: Secondary | ICD-10-CM | POA: Diagnosis not present

## 2013-10-24 DIAGNOSIS — R0609 Other forms of dyspnea: Secondary | ICD-10-CM | POA: Diagnosis not present

## 2013-10-24 DIAGNOSIS — Z0000 Encounter for general adult medical examination without abnormal findings: Secondary | ICD-10-CM | POA: Diagnosis not present

## 2013-10-24 DIAGNOSIS — Z124 Encounter for screening for malignant neoplasm of cervix: Secondary | ICD-10-CM | POA: Diagnosis not present

## 2013-10-24 DIAGNOSIS — E0590 Thyrotoxicosis, unspecified without thyrotoxic crisis or storm: Secondary | ICD-10-CM | POA: Diagnosis not present

## 2013-11-01 ENCOUNTER — Ambulatory Visit: Payer: Medicare Other | Admitting: Cardiology

## 2013-11-12 ENCOUNTER — Encounter (HOSPITAL_COMMUNITY): Payer: Self-pay

## 2013-11-12 ENCOUNTER — Other Ambulatory Visit (HOSPITAL_COMMUNITY): Payer: Self-pay | Admitting: Internal Medicine

## 2013-11-12 ENCOUNTER — Ambulatory Visit (HOSPITAL_COMMUNITY)
Admission: RE | Admit: 2013-11-12 | Discharge: 2013-11-12 | Disposition: A | Discharge status: Home / Self Care | Payer: Medicare Other | Source: Ambulatory Visit | Attending: Internal Medicine | Admitting: Internal Medicine

## 2013-11-12 DIAGNOSIS — M810 Age-related osteoporosis without current pathological fracture: Secondary | ICD-10-CM | POA: Diagnosis not present

## 2013-11-12 MED ORDER — SODIUM CHLORIDE 0.9 % IV SOLN
Freq: Once | INTRAVENOUS | Status: AC
  Administered 2013-11-12: 15:00:00 via INTRAVENOUS

## 2013-11-12 MED ORDER — ZOLEDRONIC ACID 5 MG/100ML IV SOLN
5.0000 mg | Freq: Once | INTRAVENOUS | Status: AC
  Administered 2013-11-12: 5 mg via INTRAVENOUS
  Filled 2013-11-12: qty 100

## 2013-11-12 NOTE — Discharge Instructions (Signed)

## 2013-11-29 ENCOUNTER — Ambulatory Visit: Payer: Medicare Other | Admitting: Cardiology

## 2013-11-29 DIAGNOSIS — Z1212 Encounter for screening for malignant neoplasm of rectum: Secondary | ICD-10-CM | POA: Diagnosis not present

## 2013-12-06 ENCOUNTER — Encounter: Payer: Self-pay | Admitting: Cardiology

## 2013-12-06 ENCOUNTER — Ambulatory Visit (INDEPENDENT_AMBULATORY_CARE_PROVIDER_SITE_OTHER): Payer: Medicare Other | Admitting: Cardiology

## 2013-12-06 VITALS — BP 183/83 | HR 71 | Ht 65.0 in | Wt 127.0 lb

## 2013-12-06 DIAGNOSIS — I10 Essential (primary) hypertension: Secondary | ICD-10-CM

## 2013-12-06 DIAGNOSIS — I509 Heart failure, unspecified: Secondary | ICD-10-CM

## 2013-12-06 LAB — BASIC METABOLIC PANEL
BUN: 20 mg/dL (ref 6–23)
CO2: 27 mEq/L (ref 19–32)
Calcium: 8.9 mg/dL (ref 8.4–10.5)
Chloride: 105 mEq/L (ref 96–112)
Creatinine, Ser: 0.8 mg/dL (ref 0.4–1.2)
GFR: 77.27 mL/min (ref >60.00)
Glucose, Bld: 96 mg/dL (ref 70–99)
POTASSIUM: 3.6 meq/L (ref 3.5–5.1)
Sodium: 138 mEq/L (ref 135–145)

## 2013-12-06 MED ORDER — AMLODIPINE BESYLATE 5 MG PO TABS: 5.0000 mg | ORAL_TABLET | Freq: Every day | ORAL | Status: DC

## 2013-12-06 NOTE — Patient Instructions (Signed)
Your physician wants you to follow-up in: [place] will receive a reminder letter in the mail two months in advance. If you don't receive a letter, please call our office to schedule the follow-up appointment.   START AMLODIPINE 5 MG ONCE DAILY  Your physician recommends that you HAVE LAB WORK TODAY

## 2013-12-06 NOTE — Assessment & Plan Note (Signed)
euvolemic on examination. Continue present dose of Lasix. 

## 2013-12-06 NOTE — Assessment & Plan Note (Signed)
Blood pressure is elevated. Given valvular heart disease I would like this better controlled. Add Norvasc 5 mg daily and increase as needed.

## 2013-12-06 NOTE — Progress Notes (Signed)
HPI: FU aortic insufficiency, mitral insufficiency, and tricuspid regurgitation with moderate pulmonary hypertension. Last echocardiogram in September 2014 showed an ejection fraction of 50-55%, moderate aortic insufficiency, by leaflet mitral valve prolapse with moderate mitral regurgitation, moderate tricuspid regurgitation and severe left atrial enlargement. Patient last seen in Dec of 2013. Since then, the patient has dyspnea with more extreme activities but not with routine activities. It is relieved with rest. It is not associated with chest pain. There is no orthopnea, PND or pedal edema. There is no syncope or palpitations. There is no exertional chest pain.    Current Outpatient Prescriptions  Medication Sig Dispense Refill  . aspirin EC 81 MG tablet Take 81 mg by mouth 3 (three) times a week. Monday, Wednesday and Friday      . Calcium Carbonate-Vitamin D (CALCIUM + D PO) Take 1 capsule by mouth daily.       . citalopram (CELEXA) 20 MG tablet Take 20 mg by mouth daily.        . enalapril (VASOTEC) 20 MG tablet Take 1 tablet (20 mg total) by mouth 2 (two) times daily.  60 tablet  12  . fish oil-omega-3 fatty acids 1000 MG capsule Take 2 g by mouth every evening.       . furosemide (LASIX) 40 MG tablet Take 40 mg by mouth daily.        . methimazole (TAPAZOLE) 10 MG tablet Take 5 mg by mouth daily.       . Multiple Vitamin (MULITIVITAMIN WITH MINERALS) TABS Take 1 tablet by mouth daily.        . nadolol (CORGARD) 40 MG tablet Take 20 mg by mouth daily.       . potassium chloride (K-DUR) 10 MEQ tablet Take 10 mEq by mouth daily.        . zoledronic acid (RECLAST) 5 MG/100ML SOLN Inject 5 mg into the vein. Once every (2) two years - last dose in 2012? (done at [facility])       No current facility-administered medications for this visit.     Past Medical History  Diagnosis Date  . Aortic insufficiency   . Mitral insufficiency   . Tricuspid regurgitation   .  Pulmonary hypertension   . CHF (congestive heart failure)   . COPD (chronic obstructive pulmonary disease)   . Hypertension   . Cataracts, bilateral   . CAD (coronary artery disease)   . Thyroid disease     Past Surgical History  Procedure Laterality Date  . Cataract extraction    . Breast lumpectomy    . Appendectomy    . Abdominal hysterectomy    . Eye surgery      History   Social History  . Marital Status: Married    Spouse Name: N/A    Number of Children: N/A  . Years of Education: N/A   Occupational History  . retired    Social History Main Topics  . Smoking status: Never Smoker   . Smokeless tobacco: Never Used  . Alcohol Use: No  . Drug Use: No  . Sexual Activity: Not on file   Other Topics Concern  . Not on file   Social History Narrative  . No narrative on file    ROS: no fevers or chills, productive cough, hemoptysis, dysphasia, odynophagia, melena, hematochezia, dysuria, hematuria, rash, seizure activity, orthopnea, PND, pedal edema, claudication. Remaining systems are negative.  Physical Exam: Well-developed well-nourished in no acute distress.  Skin is warm and dry.  HEENT is normal.  Neck is supple.  Chest is clear to auscultation with normal expansion.  Cardiovascular exam is regular rate and rhythm. 2/6 systolic murmur left sternal border. 1/6 diastolic murmur. Abdominal exam nontender or distended. No masses palpated. Extremities show no edema. neuro grossly intact  ECG sinus rhythm with first degree AV block. Left axis deviation. Nonspecific ST changes.

## 2013-12-06 NOTE — Assessment & Plan Note (Signed)
Patient has moderate aortic insufficiency, moderate mitral regurgitation and moderate tricuspid regurgitation. Given age we will treat conservatively. She is clear that she would never consider valve surgery regardless. Continue present dose of Lasix. Check potassium and renal function.

## 2013-12-09 ENCOUNTER — Telehealth: Payer: Self-pay | Admitting: Cardiology

## 2013-12-09 NOTE — Telephone Encounter (Signed)
New message  Patient is returning your call. Please call back.

## 2013-12-09 NOTE — Telephone Encounter (Signed)
Spoke with pt, aware of lab results. 

## 2014-02-19 DIAGNOSIS — I10 Essential (primary) hypertension: Secondary | ICD-10-CM | POA: Diagnosis not present

## 2014-02-24 ENCOUNTER — Ambulatory Visit (HOSPITAL_COMMUNITY): Admission: RE | Admit: 2014-02-24 | Payer: Medicare Other | Source: Ambulatory Visit

## 2014-02-24 ENCOUNTER — Ambulatory Visit
Admission: RE | Admit: 2014-02-24 | Discharge: 2014-02-24 | Disposition: A | Discharge status: Home / Self Care | Payer: Medicare Other | Source: Ambulatory Visit | Attending: Internal Medicine | Admitting: Internal Medicine

## 2014-02-24 ENCOUNTER — Other Ambulatory Visit: Payer: Self-pay | Admitting: Internal Medicine

## 2014-02-24 DIAGNOSIS — R911 Solitary pulmonary nodule: Secondary | ICD-10-CM | POA: Diagnosis not present

## 2014-02-24 DIAGNOSIS — J984 Other disorders of lung: Secondary | ICD-10-CM

## 2014-02-24 MED ORDER — IOHEXOL 300 MG/ML  SOLN
75.0000 mL | Freq: Once | INTRAMUSCULAR | Status: AC | PRN
  Administered 2014-02-24: 75 mL via INTRAVENOUS

## 2014-02-26 ENCOUNTER — Encounter: Payer: Self-pay | Admitting: Surgery

## 2014-02-26 ENCOUNTER — Ambulatory Visit (INDEPENDENT_AMBULATORY_CARE_PROVIDER_SITE_OTHER): Payer: Medicare Other | Admitting: Surgery

## 2014-02-26 VITALS — BP 140/70 | HR 68 | Resp 20 | Ht 65.0 in | Wt 127.0 lb

## 2014-02-26 DIAGNOSIS — R918 Other nonspecific abnormal finding of lung field: Secondary | ICD-10-CM

## 2014-03-02 ENCOUNTER — Encounter: Payer: Self-pay | Admitting: Surgery

## 2014-03-02 NOTE — Progress Notes (Signed)
HPI:  The patient returns today for followup of a small groundglass opacity within the central part of the right upper lobe as well as an 11 mm spiculated nodule in the superior segment of the left lower lobe. She had a CT scan done in May of 2011 which showed significant patchy airspace disease particular in the right lung with a 6 mm right lower lobe lung nodule and bilateral pleural effusions. In retrospect the small groundglass opacity in the right upper lobe was probably present that time but not very conspicuous. She had a followup CT scan done in 12 /2012 which showed resolution of the pleural effusions and the right lung patchy airspace disease. There was a groundglass opacity in the central part of the right upper lobe. The patient had a followup PET scan done in 12/ 2013 which again showed the groundglass opacity in the right upper lobe which did not have hypermetabolic activity. It also showed a new 11 mm spiculated nodule in the superior segment of the left lower lobe which had low level hypermetabolic activity. Given her advanced age we decided to do a followup scan in about 4 months and her follow-up scan showed that the overall size of the right upper lobe groundglass opacity was about the same, although the central solid component may have been slightly larger. The nodular opacity in the left lower lobe has definitely increased in size since December. She underwent needle biopsy of the left lung nodule and the pathology shows benign lung with organized fibrosis and a mixed chronic inflammatory infiltrate with no sign of malignancy. The lesion in the left lower lobe appeared to be benign chronic inflammation of unclear etiology. I was still concerned about the right upper lobe lesion that progressed slightly since 2011 but had no hypermetabolic activity on PET scan. We decided to do a follow-up CT scan in 6 months. When I saw her last October the left lower lobe lesion was decreasing in  size and was felt to be a benign inflammatory lesion but the right upper lobe lesion remained suspicious. She says she feels fine. No cough or sputum production.   Current Outpatient Prescriptions  Medication Sig Dispense Refill  . amLODipine (NORVASC) 5 MG tablet Take 1 tablet (5 mg total) by mouth daily.  180 tablet  3  . aspirin EC 81 MG tablet Take 81 mg by mouth 3 (three) times a week. Monday, Wednesday and Friday      . Calcium Carbonate-Vitamin D (CALCIUM + D PO) Take 1 capsule by mouth daily.       . citalopram (CELEXA) 20 MG tablet Take 20 mg by mouth daily.        . enalapril (VASOTEC) 20 MG tablet Take 1 tablet (20 mg total) by mouth 2 (two) times daily.  60 tablet  12  . fish oil-omega-3 fatty acids 1000 MG capsule Take 2 g by mouth every evening.       . furosemide (LASIX) 40 MG tablet Take 40 mg by mouth daily.        . methimazole (TAPAZOLE) 10 MG tablet Take 5 mg by mouth daily.       . Multiple Vitamin (MULITIVITAMIN WITH MINERALS) TABS Take 1 tablet by mouth daily.        . nadolol (CORGARD) 40 MG tablet Take 20 mg by mouth daily.       . potassium chloride (K-DUR) 10 MEQ tablet Take 10 mEq by mouth daily.        Marland Kitchen  zoledronic acid (RECLAST) 5 MG/100ML SOLN Inject 5 mg into the vein. Once every (2) two years - last dose in 2012? (done at [facility])       No current facility-administered medications for this visit.     Physical Exam: BP 140/70  Pulse 68  Resp 20  Ht 5\' 5"  (1.651 m)  Wt 127 lb (57.607 kg)  BMI 21.13 kg/m2  SpO2 95%  She looks well  There is no cervical or supraclavicular adenopathy  Lung exam is clear    Diagnostic Tests:  CLINICAL DATA: Follow-up lung nodules.  EXAM:  CT CHEST WITH CONTRAST  TECHNIQUE:  Multidetector CT imaging of the chest was performed during  intravenous contrast administration.  CONTRAST: 81mL OMNIPAQUE IOHEXOL 300 MG/ML SOLN  COMPARISON: 09/04/2013  FINDINGS:  The thyroid is again seen to be enlarged,  particularly the left  lobe, with multiple heterogeneously hypoattenuating nodules. No  enlarged axillary or hilar lymph nodes are identified. 1.1 cm  precarinal lymph node is unchanged. Cardiomegaly is again noted and  not significantly changed. There is no pleural or pericardial  effusion.  Mixed ground-glass and solid nodular opacity in the right upper lobe  measures 1.8 cm (image 12, previously 1.6 cm). There is a new 7 mm  solid density at its inferior aspect which may be associated with a  bronchus. Small tree-in-bud nodular densities more inferiorly in the  right lower lobe do not appear significantly changed and likely  reflect prior infection. Nodular opacities/ scarring in the right  middle lobe is also unchanged. Two 5 mm nodules in the right lower  lobe are unchanged. Left lower lobe scarring is unchanged.  Subpleural opacity in the superior segment left lower lobe has  further decreased in size, likely postinfectious/inflammatory.  A gastric diverticulum is again seen. Mild spondylosis is noted in  the thoracic spine.  IMPRESSION:  Slightly increased size of mixed solid and ground-glass nodule in  the right upper lobe, with a new solid component inferiorly.  Findings remain concerning for possible low-grade adenocarcinoma.  Electronically Signed  By: Logan Bores  On: 02/24/2014 12:43    Impression:  The right upper lobe lesion is slightly increased in size and there appears to be a new solid component inferiorly. This lesion remains suspicious and I think it would be best to proceed with a biopsy. If this is a low-grade adenocarcinoma it could be treated with SBRT in this elderly woman in good condition. I reviewed the CT with Dr. Laurence Ferrari and he thinks that the lesion is amenable to CT guided biopsy. I discussed this with the patient's granddaughter by telephone and the patient in the office and she would like to proceed.  Plan:  She will have a CT-guided biopsy of  the right upper lobe lesion and I will see her back after that.

## 2014-03-03 ENCOUNTER — Other Ambulatory Visit: Payer: Self-pay

## 2014-03-03 DIAGNOSIS — D381 Neoplasm of uncertain behavior of trachea, bronchus and lung: Secondary | ICD-10-CM

## 2014-03-06 ENCOUNTER — Other Ambulatory Visit: Payer: Self-pay | Admitting: Radiology

## 2014-03-06 ENCOUNTER — Encounter (HOSPITAL_COMMUNITY): Payer: Self-pay | Admitting: Pharmacy Technician

## 2014-03-11 ENCOUNTER — Ambulatory Visit (HOSPITAL_COMMUNITY)
Admission: RE | Admit: 2014-03-11 | Discharge: 2014-03-11 | Disposition: A | Discharge status: Home / Self Care | Payer: Medicare Other | Source: Ambulatory Visit | Attending: Surgery | Admitting: Surgery

## 2014-03-11 ENCOUNTER — Ambulatory Visit (HOSPITAL_COMMUNITY)
Admission: RE | Admit: 2014-03-11 | Discharge: 2014-03-11 | Disposition: A | Discharge status: Home / Self Care | Payer: Medicare Other | Source: Ambulatory Visit | Attending: Diagnostic Radiology | Admitting: Diagnostic Radiology

## 2014-03-11 ENCOUNTER — Encounter (HOSPITAL_COMMUNITY): Payer: Self-pay

## 2014-03-11 VITALS — BP 139/64 | HR 62 | Temp 97.8°F | Resp 20 | Ht 65.0 in | Wt 125.0 lb

## 2014-03-11 DIAGNOSIS — R918 Other nonspecific abnormal finding of lung field: Secondary | ICD-10-CM

## 2014-03-11 DIAGNOSIS — R911 Solitary pulmonary nodule: Secondary | ICD-10-CM | POA: Diagnosis not present

## 2014-03-11 DIAGNOSIS — C3410 Malignant neoplasm of upper lobe, unspecified bronchus or lung: Secondary | ICD-10-CM | POA: Diagnosis not present

## 2014-03-11 DIAGNOSIS — J984 Other disorders of lung: Secondary | ICD-10-CM | POA: Insufficient documentation

## 2014-03-11 DIAGNOSIS — D381 Neoplasm of uncertain behavior of trachea, bronchus and lung: Secondary | ICD-10-CM

## 2014-03-11 DIAGNOSIS — R222 Localized swelling, mass and lump, trunk: Secondary | ICD-10-CM | POA: Diagnosis present

## 2014-03-11 LAB — CBC
HCT: 41.3 % (ref 36.0–46.0)
Hemoglobin: 13.9 g/dL (ref 12.0–15.0)
MCH: 30.4 pg (ref 26.0–34.0)
MCHC: 33.7 g/dL (ref 30.0–36.0)
MCV: 90.4 fL (ref 78.0–100.0)
PLATELETS: 199 10*3/uL (ref 150–400)
RBC: 4.57 MIL/uL (ref 3.87–5.11)
RDW: 13.6 % (ref 11.5–15.5)
WBC: 8.3 10*3/uL (ref 4.0–10.5)

## 2014-03-11 LAB — PROTIME-INR
INR: 1.17 (ref 0.00–1.49)
PROTHROMBIN TIME: 14.7 s (ref 11.6–15.2)

## 2014-03-11 LAB — APTT: aPTT: 30 seconds (ref 24–37)

## 2014-03-11 MED ORDER — FENTANYL CITRATE 0.05 MG/ML IJ SOLN
INTRAMUSCULAR | Status: AC | PRN
  Administered 2014-03-11: 50 ug via INTRAVENOUS

## 2014-03-11 MED ORDER — SODIUM CHLORIDE 0.9 % IV SOLN
INTRAVENOUS | Status: DC
  Administered 2014-03-11: 08:00:00 via INTRAVENOUS

## 2014-03-11 MED ORDER — FENTANYL CITRATE 0.05 MG/ML IJ SOLN
INTRAMUSCULAR | Status: AC
  Filled 2014-03-11: qty 2

## 2014-03-11 MED ORDER — MIDAZOLAM HCL 2 MG/2ML IJ SOLN
INTRAMUSCULAR | Status: AC | PRN
  Administered 2014-03-11: 1 mg via INTRAVENOUS

## 2014-03-11 MED ORDER — MIDAZOLAM HCL 2 MG/2ML IJ SOLN
INTRAMUSCULAR | Status: AC
  Filled 2014-03-11: qty 4

## 2014-03-11 MED ORDER — HYDROCODONE-ACETAMINOPHEN 5-325 MG PO TABS
1.0000 | ORAL_TABLET | ORAL | Status: DC | PRN
  Filled 2014-03-11: qty 2

## 2014-03-11 MED ORDER — LIDOCAINE HCL 1 % IJ SOLN
INTRAMUSCULAR | Status: AC
  Filled 2014-03-11: qty 10

## 2014-03-11 NOTE — H&P (Signed)
Chief Complaint: "I am here for a lung mass biopsy." Referring Physician: Dr. Cyndia Bent HPI: Michelle Levine is an 80 y.o. female who presents today for an image guided right upper lobe lung mass biopsy. Recent CT on 02/24/14 revealed increase in size of mass. The patient does admit to thick white with pink or brown streaking sputum production x 2 months. She denies any current or previous tobacco use. She denies any weight loss or appetite changes. She denies any chest pain, shortness of breath or palpitations. She denies any active signs of bleeding or excessive bruising. She denies any recent fever or chills. The patient denies any history of sleep apnea or chronic oxygen use. She has previously tolerated sedation without complications during a colonoscopy.   Past Medical History:  Past Medical History  Diagnosis Date  . Aortic insufficiency   . Mitral insufficiency   . Tricuspid regurgitation   . Pulmonary hypertension   . CHF (congestive heart failure)   . COPD (chronic obstructive pulmonary disease)   . Hypertension   . Cataracts, bilateral   . CAD (coronary artery disease)   . Thyroid disease     Past Surgical History:  Past Surgical History  Procedure Laterality Date  . Cataract extraction    . Breast lumpectomy    . Appendectomy    . Abdominal hysterectomy    . Eye surgery      Family History:  Family History  Problem Relation Age of Onset  . Heart attack Mother 56  . Pneumonia Father 27  . Heart attack Brother   . Leukemia Brother   . Heart attack Brother   . Leukemia Brother     Social History:  reports that she has never smoked. She has never used smokeless tobacco. She reports that she does not drink alcohol or use illicit drugs.  Allergies: No Known Allergies  Medications:   Medication List    ASK your doctor about these medications       amLODipine 5 MG tablet  Commonly known as:  NORVASC  Take 5 mg by mouth daily.     aspirin EC 81 MG tablet  Take  81 mg by mouth 3 (three) times a week. Monday, Wednesday and Friday     CALCIUM + D PO  Take 1 capsule by mouth daily.     citalopram 20 MG tablet  Commonly known as:  CELEXA  Take 20 mg by mouth daily.     enalapril 20 MG tablet  Commonly known as:  VASOTEC  Take 1 tablet (20 mg total) by mouth 2 (two) times daily.     fish oil-omega-3 fatty acids 1000 MG capsule  Take 2 g by mouth every evening.     furosemide 40 MG tablet  Commonly known as:  LASIX  Take 40 mg by mouth daily.     methimazole 10 MG tablet  Commonly known as:  TAPAZOLE  Take 5 mg by mouth daily.     multivitamin with minerals Tabs tablet  Take 1 tablet by mouth daily.     nadolol 40 MG tablet  Commonly known as:  CORGARD  Take 20 mg by mouth daily.     potassium chloride 10 MEQ tablet  Commonly known as:  K-DUR  Take 10 mEq by mouth daily.     RECLAST 5 MG/100ML Soln injection  Generic drug:  zoledronic acid  Inject 5 mg into the vein. Once every (2) two years - last dose in 2012? (done  at [facility])       Please HPI for pertinent positives, otherwise complete 10 system ROS negative.  Physical Exam: BP 152/67  Pulse 59  Temp(Src) 97.3 F (36.3 C) (Oral)  Resp 20  Ht 5\' 5"  (1.651 m)  Wt 125 lb (56.7 kg)  BMI 20.80 kg/m2  SpO2 96% Body mass index is 20.8 kg/(m^2).  General Appearance:  Alert, cooperative, no distress  Head:  Normocephalic, without obvious abnormality, atraumatic  Neck: Supple, symmetrical, trachea midline  Lungs:   Clear to auscultation bilaterally, no w/r/r, respirations unlabored without use of accessory muscles.  Chest Wall:  No tenderness or deformity  Heart:  Regular rate and rhythm, S1, S2 normal, no murmur, rub or gallop.  Abdomen:   Soft, non-tender, non distended, (+) BS  Extremities: Extremities normal, atraumatic, no cyanosis or edema  Pulses: 2+ and symmetric  Neurologic: Normal affect, no gross deficits.   Results for orders placed during the  hospital encounter of 03/11/14 (from the past 48 hour(s))  APTT     Status: None   Collection Time    03/11/14  7:56 AM      Result Value Ref Range   aPTT 30  24 - 37 seconds  CBC     Status: None   Collection Time    03/11/14  7:56 AM      Result Value Ref Range   WBC 8.3  4.0 - 10.5 K/uL   RBC 4.57  3.87 - 5.11 MIL/uL   Hemoglobin 13.9  12.0 - 15.0 g/dL   HCT 41.3  36.0 - 46.0 %   MCV 90.4  78.0 - 100.0 fL   MCH 30.4  26.0 - 34.0 pg   MCHC 33.7  30.0 - 36.0 g/dL   RDW 13.6  11.5 - 15.5 %   Platelets 199  150 - 400 K/uL  PROTIME-INR     Status: None   Collection Time    03/11/14  7:56 AM      Result Value Ref Range   Prothrombin Time 14.7  11.6 - 15.2 seconds   INR 1.17  0.00 - 1.49   No results found.  Assessment/Plan Right upper lobe lung mass, increasing on size on recent imaging.  Request for image guided biopsy with moderate sedation. Patient has been NPO, no blood thinners, labs reviewed. Risks and Benefits discussed with the patient including, but not limited to pneumothorax, bleeding, infection, or even death. All of the patient's questions were answered, patient is agreeable to proceed. Consent signed and in chart.   Hedy Jacob PA-C 03/11/2014, 8:50 AM

## 2014-03-11 NOTE — Sedation Documentation (Signed)
Vital signs stable. Pt resting comfortably.

## 2014-03-11 NOTE — Sedation Documentation (Signed)
Dr. Anselm Pancoast s/w pt at bedside. Questions answered. Granddaughter called to bedside to go to short stay w/ pt.

## 2014-03-11 NOTE — Procedures (Signed)
CT guided core biopsies of the lesion in the right upper lobe.  No immediate complication.  Will get follow up CXR in one hour.

## 2014-03-11 NOTE — Sedation Documentation (Signed)
Needle out. Dr Anselm Pancoast instructed to keep pt on rt side x 41min. Pt tolerated well, resting.

## 2014-03-11 NOTE — Discharge Instructions (Signed)
Lung Biopsy A lung biopsy is a procedure in which a tissue sample is removed from the lung. The tissue can be examined under a microscope to help diagnose various lung disorders.  LET [facility] CARE PROVIDER KNOW ABOUT:  Any allergies you have.  All medicines you are taking, including vitamins, herbs, eye drops, creams, and over-the-counter medicines.  Previous problems you or members of your family have had with the use of anesthetics.  Any blood disorders or bleeding problems that you have.  Previous surgeries you have had.  Medical conditions you have. RISKS AND COMPLICATIONS Generally, a lung biopsy is a safe procedure. However, as with any procedure, complications can occur. Possible complications include:  Collapse of the lung.   Bleeding.   Infection.  BEFORE THE PROCEDURE  Do not eat or drink anything for 8 hours before the procedure or as directed by your health care provider.  Ask your health care provider if you need to change or stop taking your regular medicines before the procedure.  Arrange for someone to drive you home after the procedure. PROCEDURE Various methods can be used to perform a lung biopsy:   Needle biopsy: A biopsy needle is inserted into the lung. The needle is used to collect the tissue sample. A CT scanner may be used to guide the needle to the right place in the lung. For this method, a medicine is used to numb the area where the biopsy sample will be taken (local anesthetic).  Bronchoscopy: A flexible tube (bronchoscope) is inserted into your lungs by going through your mouth or nose. A needle or forceps is passed through the bronchoscope to remove the tissue sample. For this method, medicine may be used to numb the back of your throat.  Open biopsy: A cut (incision) is made in your chest. The tissue sample is then removed using surgical tools. The incision is closed with skin glue, skin adhesive strips, or stitches. For this method, you  will be given medicine to make you sleep through the procedure (general anesthetic). AFTER THE PROCEDURE Your recovery will be assessed and monitored. For the needle or bronchoscope method, you may be allowed to go home on the day of your procedure as soon as you are stable. For the open biopsy method, you may need to stay in the hospital overnight for observation. You might have soreness and tenderness at the site of the biopsy for a few days after the procedure. You might have a cough and some soreness in your throat for a few days if a bronchoscope was used. Document Released: 01/12/2005 Document Revised: 06/26/2013 Document Reviewed: 04/07/2013 [facility] Patient Information 2014 [place].

## 2014-03-17 DIAGNOSIS — H25019 Cortical age-related cataract, unspecified eye: Secondary | ICD-10-CM | POA: Diagnosis not present

## 2014-03-17 DIAGNOSIS — H409 Unspecified glaucoma: Secondary | ICD-10-CM | POA: Diagnosis not present

## 2014-03-17 DIAGNOSIS — H401290 Low-tension glaucoma, unspecified eye, stage unspecified: Secondary | ICD-10-CM | POA: Diagnosis not present

## 2014-03-19 ENCOUNTER — Other Ambulatory Visit: Payer: Self-pay | Admitting: Medical Oncology

## 2014-03-19 DIAGNOSIS — R918 Other nonspecific abnormal finding of lung field: Secondary | ICD-10-CM

## 2014-03-20 ENCOUNTER — Telehealth: Payer: Self-pay | Admitting: Internal Medicine

## 2014-03-20 ENCOUNTER — Encounter: Payer: Self-pay | Admitting: *Deleted

## 2014-03-20 ENCOUNTER — Encounter: Payer: Self-pay | Admitting: Radiation Oncology

## 2014-03-20 ENCOUNTER — Encounter: Payer: Self-pay | Admitting: Internal Medicine

## 2014-03-20 ENCOUNTER — Ambulatory Visit: Payer: Medicare Other | Attending: Internal Medicine | Admitting: Physical Therapy

## 2014-03-20 ENCOUNTER — Ambulatory Visit (HOSPITAL_BASED_OUTPATIENT_CLINIC_OR_DEPARTMENT_OTHER): Payer: Medicare Other | Admitting: Internal Medicine

## 2014-03-20 ENCOUNTER — Ambulatory Visit
Admission: RE | Admit: 2014-03-20 | Discharge: 2014-03-20 | Disposition: A | Discharge status: Home / Self Care | Payer: Medicare Other | Source: Ambulatory Visit | Attending: Radiation Oncology | Admitting: Radiation Oncology

## 2014-03-20 ENCOUNTER — Other Ambulatory Visit (HOSPITAL_BASED_OUTPATIENT_CLINIC_OR_DEPARTMENT_OTHER): Payer: Medicare Other

## 2014-03-20 VITALS — BP 151/63 | HR 64 | Temp 98.4°F | Resp 17 | Wt 126.1 lb

## 2014-03-20 VITALS — BP 151/63 | HR 64 | Temp 98.4°F | Resp 17 | Ht 65.0 in | Wt 126.1 lb

## 2014-03-20 DIAGNOSIS — C3490 Malignant neoplasm of unspecified part of unspecified bronchus or lung: Secondary | ICD-10-CM | POA: Insufficient documentation

## 2014-03-20 DIAGNOSIS — C3491 Malignant neoplasm of unspecified part of right bronchus or lung: Secondary | ICD-10-CM | POA: Insufficient documentation

## 2014-03-20 DIAGNOSIS — C3410 Malignant neoplasm of upper lobe, unspecified bronchus or lung: Secondary | ICD-10-CM

## 2014-03-20 DIAGNOSIS — R918 Other nonspecific abnormal finding of lung field: Secondary | ICD-10-CM

## 2014-03-20 DIAGNOSIS — IMO0001 Reserved for inherently not codable concepts without codable children: Secondary | ICD-10-CM | POA: Diagnosis not present

## 2014-03-20 DIAGNOSIS — R269 Unspecified abnormalities of gait and mobility: Secondary | ICD-10-CM | POA: Diagnosis not present

## 2014-03-20 LAB — CBC WITH DIFFERENTIAL/PLATELET
BASO%: 0.8 % (ref 0.0–2.0)
Basophils Absolute: 0.1 10*3/uL (ref 0.0–0.1)
EOS%: 9.7 % — ABNORMAL HIGH (ref 0.0–7.0)
Eosinophils Absolute: 0.8 10*3/uL — ABNORMAL HIGH (ref 0.0–0.5)
HCT: 40.8 % (ref 34.8–46.6)
HGB: 13.5 g/dL (ref 11.6–15.9)
LYMPH%: 31.5 % (ref 14.0–49.7)
MCH: 29.9 pg (ref 25.1–34.0)
MCHC: 33.1 g/dL (ref 31.5–36.0)
MCV: 90.5 fL (ref 79.5–101.0)
MONO#: 0.7 10*3/uL (ref 0.1–0.9)
MONO%: 7.6 % (ref 0.0–14.0)
NEUT#: 4.3 10*3/uL (ref 1.5–6.5)
NEUT%: 50.4 % (ref 38.4–76.8)
Platelets: 226 10*3/uL (ref 145–400)
RBC: 4.51 10*6/uL (ref 3.70–5.45)
RDW: 14 % (ref 11.2–14.5)
WBC: 8.5 10*3/uL (ref 3.9–10.3)
lymph#: 2.7 10*3/uL (ref 0.9–3.3)

## 2014-03-20 LAB — COMPREHENSIVE METABOLIC PANEL (CC13)
ALT: 15 U/L (ref 0–55)
AST: 20 U/L (ref 5–34)
Albumin: 3.2 g/dL — ABNORMAL LOW (ref 3.5–5.0)
Alkaline Phosphatase: 44 U/L (ref 40–150)
Anion Gap: 14 mEq/L — ABNORMAL HIGH (ref 3–11)
BILIRUBIN TOTAL: 0.28 mg/dL (ref 0.20–1.20)
BUN: 23.3 mg/dL (ref 7.0–26.0)
CO2: 23 mEq/L (ref 22–29)
Calcium: 9.4 mg/dL (ref 8.4–10.4)
Chloride: 105 mEq/L (ref 98–109)
Creatinine: 0.9 mg/dL (ref 0.6–1.1)
Glucose: 118 mg/dl (ref 70–140)
Potassium: 4.1 mEq/L (ref 3.5–5.1)
SODIUM: 143 meq/L (ref 136–145)
TOTAL PROTEIN: 6.8 g/dL (ref 6.4–8.3)

## 2014-03-20 NOTE — Progress Notes (Signed)
[place] [phone]   Fax:([phone]) [phone] Multidisciplinary thoracic oncology clinic ([place])  CONSULT NOTE  REFERRING PHYSICIAN: Dr. Gilford Raid  REASON FOR CONSULTATION:  80 years old white female recently diagnosed with lung cancer.  HPI Michelle Levine is a 80 y.o. female was past medical history significant for COPD, hypertension, congestive heart failure, Ecotrin and mitral insufficiency as well as pulmonary hypertension. The patient is a never smoker. On 10/25/2011 the patient had CT angiogram of the chest performed for evaluation of chest pain and it showed patchy areas of scarring at the lung basis and in the right upper lobe. On 10/29/2012 the patient had a PET scan performed and it showed 1.1 CM nodule in the superior segment of the left lower lobe with low level FDG uptake with SUV max of 2.7. General is concerning for neoplasm. There is a 1.3 CM groundglass opacity in the right upper lobe with no FDG uptake. The patient has followed by observation and repeat CT scan of the chest at regular basis. Repeat CT scan of the chest on 02/13/2013 showed mixed solid and groundglass nodule in the right upper lobe likely stable and overall size when compared to 03/19/2010 however the solid component maybe very minimally larger and the findings are concerning for low grade adenocarcinoma. 14 2015 the patient underwent CT-guided core biopsy of the enlarging nodule in the superior segment of the left lower lobe by interventional radiology. The final pathology showed no malignancy. Repeat CT scan of the chest on 02/24/2014 showed slight increased size of left-sided and ground glass nodule in the right upper lobe with new solid component inferiorly and the findings remain concerning for low grade adenocarcinoma. On 03/11/2014 the patient underwent CT-guided biopsy of the right upper lobe lesion by interventional radiology. The final pathology (Accession: [id number]) was  consistent with adenocarcinoma. There is malignant epithelium present demonstrating prominent lepidic growth pattern. The features are diagnostic of adenocarcinoma. The tissue blocks will be sent to [facility] for molecular Biomarker testing. Dr. Cyndia Bent kindly referred the patient to me today for further evaluation and recommendation regarding her condition. When seen today the patient is feeling fine with no specific complaints. She denied having any significant chest pain, shortness breath, cough or hemoptysis. She denied having any fever or chills. She has no nausea or vomiting. He has no significant weight loss or night sweats. She denied having any significant headache or blurry vision. Family history significant for a mother died from heart disease at age 48 and father died from pneumonia at age 35. The patient had brother who was diagnosed with CML. The patient is married and has one son. She was accompanied by her granddaughter [name]. She used to work for the [employer]. She denied having any history of smoking, alcohol or drug abuse. HPI  Past Medical History  Diagnosis Date  . Aortic insufficiency   . Mitral insufficiency   . Tricuspid regurgitation   . Pulmonary hypertension   . CHF (congestive heart failure)   . COPD (chronic obstructive pulmonary disease)   . Hypertension   . Cataracts, bilateral   . CAD (coronary artery disease)   . Thyroid disease     Past Surgical History  Procedure Laterality Date  . Cataract extraction    . Breast lumpectomy    . Appendectomy    . Abdominal hysterectomy    . Eye surgery      Family History  Problem Relation Age of Onset  . Heart attack  Mother 46  . Pneumonia Father 51  . Heart attack Brother   . Leukemia Brother   . Heart attack Brother   . Leukemia Brother     Social History History  Substance Use Topics  . Smoking status: Never Smoker   . Smokeless tobacco: Never Used  . Alcohol Use: No    No Known  Allergies  Current Outpatient Prescriptions  Medication Sig Dispense Refill  . amLODipine (NORVASC) 5 MG tablet Take 5 mg by mouth daily.      Marland Kitchen aspirin EC 81 MG tablet Take 81 mg by mouth 3 (three) times a week. Monday, Wednesday and Friday      . Calcium Carbonate-Vitamin D (CALCIUM + D PO) Take 1 capsule by mouth daily.       . citalopram (CELEXA) 20 MG tablet Take 20 mg by mouth daily.       . enalapril (VASOTEC) 20 MG tablet Take 1 tablet (20 mg total) by mouth 2 (two) times daily.  60 tablet  12  . fish oil-omega-3 fatty acids 1000 MG capsule Take 2 g by mouth every evening.       . furosemide (LASIX) 40 MG tablet Take 40 mg by mouth daily.       Marland Kitchen KLOR-CON M10 10 MEQ tablet Take 10 mEq by mouth daily.      . methimazole (TAPAZOLE) 10 MG tablet Take 5 mg by mouth daily.       . Multiple Vitamin (MULITIVITAMIN WITH MINERALS) TABS Take 1 tablet by mouth daily.       . nadolol (CORGARD) 40 MG tablet Take 20 mg by mouth daily.       . zoledronic acid (RECLAST) 5 MG/100ML SOLN Inject 5 mg into the vein. Once every (2) two years - last dose in 2012? (done at [facility])       No current facility-administered medications for this visit.    Review of Systems  Constitutional: negative Eyes: negative Ears, nose, mouth, throat, and face: negative Respiratory: negative Cardiovascular: negative Gastrointestinal: negative Genitourinary:negative Integument/breast: negative Hematologic/lymphatic: negative Musculoskeletal:negative Neurological: negative Behavioral/Psych: negative Endocrine: negative Allergic/Immunologic: negative  Physical Exam  VHQ:IONGE, healthy, no distress, well nourished and well developed SKIN: skin color, texture, turgor are normal, no rashes or significant lesions HEAD: Normocephalic, No masses, lesions, tenderness or abnormalities EYES: normal, PERRLA EARS: External ears normal, Canals clear OROPHARYNX:no exudate and no erythema  NECK: supple, no  adenopathy LYMPH:  no palpable lymphadenopathy, no hepatosplenomegaly BREAST:not examined LUNGS: clear to auscultation , and palpation HEART: regular rate & rhythm and diastolic murmur ABDOMEN:abdomen soft, non-tender, normal bowel sounds and no masses or organomegaly BACK: Back symmetric, no curvature., No CVA tenderness EXTREMITIES:no joint deformities, effusion, or inflammation, no edema, no skin discoloration  NEURO: alert & oriented x 3 with fluent speech, no focal motor/sensory deficits  PERFORMANCE STATUS: ECOG 1  LABORATORY DATA: Lab Results  Component Value Date   WBC 8.5 03/20/2014   HGB 13.5 03/20/2014   HCT 40.8 03/20/2014   MCV 90.5 03/20/2014   PLT 226 03/20/2014      Chemistry      Component Value Date/Time   NA 143 03/20/2014 1328   NA 138 12/06/2013 1440   K 4.1 03/20/2014 1328   K 3.6 12/06/2013 1440   CL 105 12/06/2013 1440   CO2 23 03/20/2014 1328   CO2 27 12/06/2013 1440   BUN 23.3 03/20/2014 1328   BUN 20 12/06/2013 1440   CREATININE 0.9  03/20/2014 1328   CREATININE 0.8 12/06/2013 1440      Component Value Date/Time   CALCIUM 9.4 03/20/2014 1328   CALCIUM 8.9 12/06/2013 1440   ALKPHOS 44 03/20/2014 1328   ALKPHOS 76 03/22/2010 0450   AST 20 03/20/2014 1328   AST 23 03/22/2010 0450   ALT 15 03/20/2014 1328   ALT 29 03/22/2010 0450   BILITOT 0.28 03/20/2014 1328   BILITOT 0.6 03/22/2010 0450       RADIOGRAPHIC STUDIES: Ct Chest W Contrast  02/24/2014   CLINICAL DATA:  Follow-up lung nodules.  EXAM: CT CHEST WITH CONTRAST  TECHNIQUE: Multidetector CT imaging of the chest was performed during intravenous contrast administration.  CONTRAST:  57m OMNIPAQUE IOHEXOL 300 MG/ML  SOLN  COMPARISON:  09/04/2013  FINDINGS: The thyroid is again seen to be enlarged, particularly the left lobe, with multiple heterogeneously hypoattenuating nodules. No enlarged axillary or hilar lymph nodes are identified. 1.1 cm precarinal lymph node is unchanged. Cardiomegaly is again noted and not  significantly changed. There is no pleural or pericardial effusion.  Mixed ground-glass and solid nodular opacity in the right upper lobe measures 1.8 cm (image 12, previously 1.6 cm). There is a new 7 mm solid density at its inferior aspect which may be associated with a bronchus. Small tree-in-bud nodular densities more inferiorly in the right lower lobe do not appear significantly changed and likely reflect prior infection. Nodular opacities/ scarring in the right middle lobe is also unchanged. Two 5 mm nodules in the right lower lobe are unchanged. Left lower lobe scarring is unchanged. Subpleural opacity in the superior segment left lower lobe has further decreased in size, likely postinfectious/inflammatory.  A gastric diverticulum is again seen. Mild spondylosis is noted in the thoracic spine.  IMPRESSION: Slightly increased size of mixed solid and ground-glass nodule in the right upper lobe, with a new solid component inferiorly. Findings remain concerning for possible low-grade adenocarcinoma.   Electronically Signed   By: ALogan Bores  On: 02/24/2014 12:43   Ct Biopsy  03/11/2014   CLINICAL DATA:  Enlarging lesion in the right upper lobe.  EXAM: CT GUIDED BIOPSY OF RIGHT UPPER LUNG LESION  Physician: AStephan Minister HAnselm Pancoast MD  MEDICATIONS: 1 mg versed, 50 mcg fentanyl. A radiology nurse monitored the patient for moderate sedation.  ANESTHESIA/SEDATION: Sedation time: 16 min  PROCEDURE: The procedure was explained to the patient. The risks and benefits of the procedure were discussed and the patient's questions were addressed. Informed consent was obtained from the patient. The patient was placed on her right side. CT images through the chest were obtained. The ground-glass lesion with a central solid nodule was identified. A right posterior approach was selected. The right side of the back was prepped with Betadine and a sterile drape was placed. The skin and soft tissues were anesthetized with 1% lidocaine.  Using CT guidance, 17 gauge needle was directed towards the solid portion of the lesion. Two core biopsies were obtained with an 18 gauge core device. Specimens were placed in formalin. 17 gauge needle was removed without complication. Bandage was placed over the puncture site.  FINDINGS: There is a mixed solid and ground-glass lesion in the right upper lobe. Needle was placed along the solid portion of the lesion. Two core biopsies were obtained. Small amount of parenchymal hemorrhage following the biopsies. No significant pneumothorax.  COMPLICATIONS: None  IMPRESSION: CT-guided core biopsies of the right upper lung lesion.   Electronically Signed   By: AQuita Skye  Anselm Pancoast M.D.   On: 03/11/2014 12:19   Dg Chest Port 1 View  03/11/2014   CLINICAL DATA:  Post right lung biopsy.  EXAM: PORTABLE CHEST - 1 VIEW  COMPARISON:  CT 02/24/2014  FINDINGS: Right upper lobe nodule is noted. No visible pneumothorax post biopsy. Mild hyperinflation of the lungs. Heart is borderline in size. No visible effusions.  IMPRESSION: Right upper lobe pulmonary nodule again noted. No visible pneumothorax following biopsy.   Electronically Signed   By: Rolm Baptise M.D.   On: 03/11/2014 11:45    ASSESSMENT: This is a very pleasant 80 years old white female recently diagnosed with non-small cell lung cancer, adenocarcinoma with multiple pulmonary nodules but at least a stage IA (T1a, N0, M0) involving the right upper lobe, with other bilateral benign nodule suspicious for metastatic disease or synchronous primaries  PLAN: I have a lengthy discussion with the patient and her granddaughter today about her current disease status and treatment options. I recommended for the patient to complete the staging workup by ordering a PET scan as well as MRI of the brain to rule out metastatic disease. The patient would benefit from stereotactic radiotherapy to the confirmed non-small cell lung cancer of the right upper lobe. I will [facility] for a molecular biomarker testing. The patient is a nonsmoker and if she has breasts of EGFR mutation or ALK gene translocation, she may benefit from treatment with target oral agent with tyrosine kinase inhibitor. I will see her back for followup visit in 6 weeks for reevaluation and further discussion of her treatment options based on the staging and molecular testing. The patient will be seen later today by Dr. Pablo Ledger for evaluation and discussion of the stereotactic radiotherapy. She was seen today at the multidisciplinary thoracic oncology clinic by medical oncology, radiation oncology, thoracic navigator, social worker as well as physical therapist. She was advised to call immediately if she has any concerning symptoms in the interval. The patient voices understanding of current disease status and treatment options and is in agreement with the current care plan.  All questions were answered. The patient knows to call the clinic with any problems, questions or concerns. We can certainly see the patient much sooner if necessary.  Thank you so much for allowing me to participate in the care of Michelle Levine. I will continue to follow up the patient with you and assist in her care.  I spent 40 minutes counseling the patient face to face. The total time spent in the appointment was 60 minutes.  Disclaimer: This note was dictated with voice recognition software. Similar sounding words can inadvertently be transcribed and may not be corrected upon review.   Curt Bears 03/20/2014, 3:08 PM

## 2014-03-20 NOTE — Progress Notes (Signed)
   Thoracic Treatment Summary Name:Michelle Levine Date:03/20/2014 DOB:1922/04/30 Your Medical Team Medical Oncologist:Dr. Julien Nordmann  Radiation Oncologist: Dr. Pablo Ledger Surgeon: Dr. Cyndia Bent Type and Stage of Lung Cancer Non-Small Cell Carcinoma: Adenocarcinoma Clinical Stage: I   Clinical stage is based on radiology exams.  Pathological stage will be determined after surgery.  Staging is based on the size of the tumor, involvement of lymph nodes or not, and whether or not the cancer center has spread. Recommendations Recommendations: Radiation therapy and molecular testing   These recommendations are based on information available as of today's consult.  This is subject to change depending further testing or exams. Next Steps Next Step: 1. PET and MRI Brain scan 04/04/14 2. Radiation therapy will call and set up radiation appointments  Barriers to Care What do you perceive as a potential barrier that may prevent you from receiving your treatment plan? Nothing perceived at this time. Resources given: NCI booklet on lung cancer  SIM educational material from [place] at [employer].[name] [phone] Questions Norton Blizzard, RN BSN Thoracic Oncology Nurse Navigator at [facility] is a nurse navigator that is available to assist you through your cancer journey.  She can answer your questions and/or provide resources regarding your treatment plan, emotional support, or financial concerns.

## 2014-03-20 NOTE — Progress Notes (Signed)
[place] Clinical Social Work  Clinical Social Work met with patient and patient's granddaughter and Futures trader at [facility] appointment to offer support and assess for psychosocial needs.  Medical oncologist reviewed patient's diagnosis and recommended treatment plan with patient/family.  Mrs. Mussell is currently married, has one son two daughters, and multiple grandchildren.  The patient reports feeling well supported by family, is agreeable to treatment plan, and has no concerns at this time.  Clinical Social Work briefly discussed Clinical Social Work role and [employer] support programs/services.  Clinical Social Work encouraged patient to call with any additional questions or concerns.   Polo Riley, MSW, LCSW, OSW-C Clinical Social Worker [facility] [phone]

## 2014-03-20 NOTE — Progress Notes (Signed)
Radiation Oncology         [phone]) [phone] ________________________________  Initial outpatient Consultation - Date: 03/20/2014   Name: Michelle Levine MRN: [id number]   DOB: 09-Aug-1922  REFERRING PHYSICIAN: Gaye Pollack, MD  DIAGNOSIS:  1. Malignant neoplasm of upper lobe, bronchus or lung    STAGE: Stage I  HISTORY OF PRESENT ILLNESS::Michelle Levine is a 80 y.o. female  who has been followed for several years for a right upper lobe nodule. On a PET scan in December 2013 this nodule is noted not to have hypermetabolic activity. There was a left lower lobe nodule with some low level hypermetabolic activity which was biopsied and was found to be inflammation. No signs of malignancy was noted. 2 monitor the right upper lobe nodule a followup CT scan was ordered. This was performed on 02/24/2014. A mixed groundglass and solid nodular opacity was noted in the right upper lobe measuring 1.8 cm and previously measuring 1.6 cm. A 7 mm density at the inferior aspect of the mass was also noted. Other nodules within the lungs were stable. She underwent a biopsy of this mass on 03/11/2014 which showed adenocarcinoma she has not had a PET scan or MRI of the brain. She is asymptomatic. She has no weight loss. No cough or sputum production. No headaches. She presents with her granddaughter today for my recommendations regarding treatment of this new nodule. She is not felt to be a surgical candidate given her age.Marland Kitchen  PREVIOUS RADIATION THERAPY: No  PAST MEDICAL HISTORY:  has a past medical history of Aortic insufficiency; Mitral insufficiency; Tricuspid regurgitation; Pulmonary hypertension; CHF (congestive heart failure); COPD (chronic obstructive pulmonary disease); Hypertension; Cataracts, bilateral; CAD (coronary artery disease); and Thyroid disease.    PAST SURGICAL HISTORY: Past Surgical History  Procedure Laterality Date  . Cataract extraction    . Breast lumpectomy    . Appendectomy    .  Abdominal hysterectomy    . Eye surgery      FAMILY HISTORY:  Family History  Problem Relation Age of Onset  . Heart attack Mother 63  . Pneumonia Father 11  . Heart attack Brother   . Leukemia Brother   . Heart attack Brother   . Leukemia Brother     SOCIAL HISTORY:  History  Substance Use Topics  . Smoking status: Never Smoker   . Smokeless tobacco: Never Used  . Alcohol Use: No    ALLERGIES: Review of patient's allergies indicates no known allergies.  MEDICATIONS:  Current Outpatient Prescriptions  Medication Sig Dispense Refill  . amLODipine (NORVASC) 5 MG tablet Take 5 mg by mouth daily.      Marland Kitchen aspirin EC 81 MG tablet Take 81 mg by mouth 3 (three) times a week. Monday, Wednesday and Friday      . Calcium Carbonate-Vitamin D (CALCIUM + D PO) Take 1 capsule by mouth daily.       . citalopram (CELEXA) 20 MG tablet Take 20 mg by mouth daily.       . enalapril (VASOTEC) 20 MG tablet Take 1 tablet (20 mg total) by mouth 2 (two) times daily.  60 tablet  12  . fish oil-omega-3 fatty acids 1000 MG capsule Take 2 g by mouth every evening.       . furosemide (LASIX) 40 MG tablet Take 40 mg by mouth daily.       Marland Kitchen KLOR-CON M10 10 MEQ tablet Take 10 mEq by mouth daily.      Marland Kitchen  methimazole (TAPAZOLE) 10 MG tablet Take 5 mg by mouth daily.       . Multiple Vitamin (MULITIVITAMIN WITH MINERALS) TABS Take 1 tablet by mouth daily.       . nadolol (CORGARD) 40 MG tablet Take 20 mg by mouth daily.       . zoledronic acid (RECLAST) 5 MG/100ML SOLN Inject 5 mg into the vein. Once every (2) two years - last dose in 2012? (done at [facility])       No current facility-administered medications for this encounter.    REVIEW OF SYSTEMS:  A 15 point review of systems is documented in the electronic medical record. This was obtained by the nursing staff. However, I reviewed this with the patient to discuss relevant findings and make appropriate changes.  Pertinent items are noted in  HPI.  PHYSICAL EXAM:  Filed Vitals:   03/20/14 1400  BP: 151/63  Pulse: 64  Temp: 98.4 F (36.9 C)  Resp: 17  .126 lb 1.6 oz (57.199 kg). Pleasant female in no distress. Alert and oriented x3. 5 out of 5 strength bilaterally. Able to raise her hands above her head.  LABORATORY DATA:  Lab Results  Component Value Date   WBC 8.5 03/20/2014   HGB 13.5 03/20/2014   HCT 40.8 03/20/2014   MCV 90.5 03/20/2014   PLT 226 03/20/2014   Lab Results  Component Value Date   NA 143 03/20/2014   K 4.1 03/20/2014   CL 105 12/06/2013   CO2 23 03/20/2014   Lab Results  Component Value Date   ALT 15 03/20/2014   AST 20 03/20/2014   ALKPHOS 44 03/20/2014   BILITOT 0.28 03/20/2014     RADIOGRAPHY: Ct Chest W Contrast  02/24/2014   CLINICAL DATA:  Follow-up lung nodules.  EXAM: CT CHEST WITH CONTRAST  TECHNIQUE: Multidetector CT imaging of the chest was performed during intravenous contrast administration.  CONTRAST:  76mL OMNIPAQUE IOHEXOL 300 MG/ML  SOLN  COMPARISON:  09/04/2013  FINDINGS: The thyroid is again seen to be enlarged, particularly the left lobe, with multiple heterogeneously hypoattenuating nodules. No enlarged axillary or hilar lymph nodes are identified. 1.1 cm precarinal lymph node is unchanged. Cardiomegaly is again noted and not significantly changed. There is no pleural or pericardial effusion.  Mixed ground-glass and solid nodular opacity in the right upper lobe measures 1.8 cm (image 12, previously 1.6 cm). There is a new 7 mm solid density at its inferior aspect which may be associated with a bronchus. Small tree-in-bud nodular densities more inferiorly in the right lower lobe do not appear significantly changed and likely reflect prior infection. Nodular opacities/ scarring in the right middle lobe is also unchanged. Two 5 mm nodules in the right lower lobe are unchanged. Left lower lobe scarring is unchanged. Subpleural opacity in the superior segment left lower lobe has further decreased  in size, likely postinfectious/inflammatory.  A gastric diverticulum is again seen. Mild spondylosis is noted in the thoracic spine.  IMPRESSION: Slightly increased size of mixed solid and ground-glass nodule in the right upper lobe, with a new solid component inferiorly. Findings remain concerning for possible low-grade adenocarcinoma.   Electronically Signed   By: Logan Bores   On: 02/24/2014 12:43   Ct Biopsy  03/11/2014   CLINICAL DATA:  Enlarging lesion in the right upper lobe.  EXAM: CT GUIDED BIOPSY OF RIGHT UPPER LUNG LESION  Physician: Stephan Minister. Anselm Pancoast, MD  MEDICATIONS: 1 mg versed, 50 mcg fentanyl.  A radiology nurse monitored the patient for moderate sedation.  ANESTHESIA/SEDATION: Sedation time: 16 min  PROCEDURE: The procedure was explained to the patient. The risks and benefits of the procedure were discussed and the patient's questions were addressed. Informed consent was obtained from the patient. The patient was placed on her right side. CT images through the chest were obtained. The ground-glass lesion with a central solid nodule was identified. A right posterior approach was selected. The right side of the back was prepped with Betadine and a sterile drape was placed. The skin and soft tissues were anesthetized with 1% lidocaine. Using CT guidance, 17 gauge needle was directed towards the solid portion of the lesion. Two core biopsies were obtained with an 18 gauge core device. Specimens were placed in formalin. 17 gauge needle was removed without complication. Bandage was placed over the puncture site.  FINDINGS: There is a mixed solid and ground-glass lesion in the right upper lobe. Needle was placed along the solid portion of the lesion. Two core biopsies were obtained. Small amount of parenchymal hemorrhage following the biopsies. No significant pneumothorax.  COMPLICATIONS: None  IMPRESSION: CT-guided core biopsies of the right upper lung lesion.   Electronically Signed   By: Markus Daft M.D.    On: 03/11/2014 12:19   Dg Chest Port 1 View  03/11/2014   CLINICAL DATA:  Post right lung biopsy.  EXAM: PORTABLE CHEST - 1 VIEW  COMPARISON:  CT 02/24/2014  FINDINGS: Right upper lobe nodule is noted. No visible pneumothorax post biopsy. Mild hyperinflation of the lungs. Heart is borderline in size. No visible effusions.  IMPRESSION: Right upper lobe pulmonary nodule again noted. No visible pneumothorax following biopsy.   Electronically Signed   By: Rolm Baptise M.D.   On: 03/11/2014 11:45      IMPRESSION: T1 N0 adenocarcinoma of the right upper lobe  PLAN: I spoke with the patient and her granddaughter today. We discussed the role of stereotactic body radiotherapy in the treatment of early stage lung cancers. We discussed the process of simulation and the making a mold. We discussed 4D CT and the use of abdominal compression. We discussed the placement of tattoos. I think this will be a relatively low risk procedure for her in terms of worsening her breathing or significantly impacting her quality of life. We discussed the difficulty use and following these lesions after treatment and the appearance of scar tissue. She would like to proceed on with treatment. I gave her daughter some information regarding stereotactic body radiotherapy. We will get her PET scan and MRI of the brain set up as soon as possible. I will then have my staff reach out for her to schedule stereotactic simulation. I spent 60 minutes  face to face with the patient and more than 50% of that time was spent in counseling and/or coordination of care.   ------------------------------------------------  Thea Silversmith, MD

## 2014-03-20 NOTE — Telephone Encounter (Signed)
gv adnprnted appt sched and avs for pt for May and June.Marland KitchenMarland KitchenMarland Kitchen

## 2014-03-21 ENCOUNTER — Encounter: Payer: Self-pay | Admitting: *Deleted

## 2014-03-21 NOTE — [facility] Oncology Navigator Note (Unsigned)
[facility] one request to [facility] Pathology

## 2014-03-24 ENCOUNTER — Telehealth: Payer: Self-pay | Admitting: *Deleted

## 2014-03-24 DIAGNOSIS — Z1231 Encounter for screening mammogram for malignant neoplasm of breast: Secondary | ICD-10-CM | POA: Diagnosis not present

## 2014-03-24 NOTE — Telephone Encounter (Signed)
Called patient's granddaughter- [name] to inform that her grandmother's Pet has been rescheduled for 03-26-14 @ 10 am @ [facility] Radiology.

## 2014-03-26 ENCOUNTER — Encounter (HOSPITAL_COMMUNITY): Payer: Self-pay

## 2014-03-26 ENCOUNTER — Ambulatory Visit (HOSPITAL_COMMUNITY)
Admission: RE | Admit: 2014-03-26 | Discharge: 2014-03-26 | Disposition: A | Discharge status: Home / Self Care | Payer: Medicare Other | Source: Ambulatory Visit | Attending: Radiation Oncology | Admitting: Radiation Oncology

## 2014-03-26 DIAGNOSIS — K8020 Calculus of gallbladder without cholecystitis without obstruction: Secondary | ICD-10-CM | POA: Insufficient documentation

## 2014-03-26 DIAGNOSIS — I2510 Atherosclerotic heart disease of native coronary artery without angina pectoris: Secondary | ICD-10-CM | POA: Insufficient documentation

## 2014-03-26 DIAGNOSIS — C3410 Malignant neoplasm of upper lobe, unspecified bronchus or lung: Secondary | ICD-10-CM | POA: Diagnosis not present

## 2014-03-26 DIAGNOSIS — G319 Degenerative disease of nervous system, unspecified: Secondary | ICD-10-CM | POA: Insufficient documentation

## 2014-03-26 DIAGNOSIS — E049 Nontoxic goiter, unspecified: Secondary | ICD-10-CM | POA: Diagnosis not present

## 2014-03-26 DIAGNOSIS — I771 Stricture of artery: Secondary | ICD-10-CM | POA: Insufficient documentation

## 2014-03-26 DIAGNOSIS — K314 Gastric diverticulum: Secondary | ICD-10-CM | POA: Insufficient documentation

## 2014-03-26 DIAGNOSIS — I517 Cardiomegaly: Secondary | ICD-10-CM | POA: Insufficient documentation

## 2014-03-26 DIAGNOSIS — R599 Enlarged lymph nodes, unspecified: Secondary | ICD-10-CM | POA: Diagnosis not present

## 2014-03-26 LAB — GLUCOSE, CAPILLARY: Glucose-Capillary: 106 mg/dL — ABNORMAL HIGH (ref 70–99)

## 2014-03-26 MED ORDER — FLUDEOXYGLUCOSE F - 18 (FDG) INJECTION
6.8000 | Freq: Once | INTRAVENOUS | Status: AC | PRN
  Administered 2014-03-26: 6.8 via INTRAVENOUS

## 2014-03-27 ENCOUNTER — Other Ambulatory Visit (HOSPITAL_COMMUNITY)
Admission: RE | Admit: 2014-03-27 | Discharge: 2014-03-27 | Disposition: A | Discharge status: Home / Self Care | Payer: Medicare Other | Source: Ambulatory Visit | Attending: Internal Medicine | Admitting: Internal Medicine

## 2014-03-27 DIAGNOSIS — C3490 Malignant neoplasm of unspecified part of unspecified bronchus or lung: Secondary | ICD-10-CM | POA: Diagnosis not present

## 2014-04-01 ENCOUNTER — Telehealth: Payer: Self-pay | Admitting: *Deleted

## 2014-04-01 NOTE — Telephone Encounter (Signed)
Received call from [facility] that there is insufficient tissue.  Per Dr Vista Mink, okay to suspend order.  Called # back [phone] ext [phone] and left a message with information and requested a call back to verify they received my msg.  SLJ

## 2014-04-03 NOTE — Telephone Encounter (Signed)
Received call back from [facility] that the pathology dept feels that they can push through for testing if this is the only sample available.  They want to verify that Dr Vista Mink would be interested in proceeding or whether he wants to suspend.  Per Dr Vista Mink, okay to proceed.  Left msg at [phone] ext [phone] and left message information and requesting a call back to verify they received msg.  SLJ

## 2014-04-03 NOTE — Telephone Encounter (Signed)
Received verification call that they will be proceeding with the tissue they have for [facility].

## 2014-04-04 ENCOUNTER — Other Ambulatory Visit: Payer: Self-pay | Admitting: Radiation Oncology

## 2014-04-04 ENCOUNTER — Other Ambulatory Visit (HOSPITAL_COMMUNITY): Payer: Medicare Other

## 2014-04-04 ENCOUNTER — Ambulatory Visit (HOSPITAL_COMMUNITY)
Admission: RE | Admit: 2014-04-04 | Discharge: 2014-04-04 | Disposition: A | Discharge status: Home / Self Care | Payer: Medicare Other | Source: Ambulatory Visit | Attending: Radiation Oncology | Admitting: Radiation Oncology

## 2014-04-04 ENCOUNTER — Telehealth: Payer: Self-pay

## 2014-04-04 DIAGNOSIS — C3410 Malignant neoplasm of upper lobe, unspecified bronchus or lung: Secondary | ICD-10-CM

## 2014-04-04 DIAGNOSIS — C3490 Malignant neoplasm of unspecified part of unspecified bronchus or lung: Secondary | ICD-10-CM | POA: Diagnosis not present

## 2014-04-04 MED ORDER — GADOBENATE DIMEGLUMINE 529 MG/ML IV SOLN
11.0000 mL | Freq: Once | INTRAVENOUS | Status: AC | PRN
  Administered 2014-04-04: 11 mL via INTRAVENOUS

## 2014-04-04 NOTE — Telephone Encounter (Signed)
Spoke with patient's daughter Deashia Soule on 03/28/14 and informed that PET  Is good and that we will go forth with SBRT.

## 2014-04-08 DIAGNOSIS — IMO0002 Reserved for concepts with insufficient information to code with codable children: Secondary | ICD-10-CM | POA: Diagnosis not present

## 2014-04-08 DIAGNOSIS — S1093XA Contusion of unspecified part of neck, initial encounter: Secondary | ICD-10-CM | POA: Diagnosis not present

## 2014-04-08 DIAGNOSIS — I10 Essential (primary) hypertension: Secondary | ICD-10-CM | POA: Diagnosis not present

## 2014-04-08 DIAGNOSIS — S0003XA Contusion of scalp, initial encounter: Secondary | ICD-10-CM | POA: Diagnosis not present

## 2014-04-17 ENCOUNTER — Ambulatory Visit
Admission: RE | Admit: 2014-04-17 | Discharge: 2014-04-17 | Disposition: A | Discharge status: Home / Self Care | Payer: Medicare Other | Source: Ambulatory Visit | Attending: Radiation Oncology | Admitting: Radiation Oncology

## 2014-04-17 DIAGNOSIS — C3490 Malignant neoplasm of unspecified part of unspecified bronchus or lung: Secondary | ICD-10-CM

## 2014-04-17 DIAGNOSIS — Z510 Encounter for antineoplastic radiation therapy: Secondary | ICD-10-CM | POA: Insufficient documentation

## 2014-04-17 DIAGNOSIS — C3410 Malignant neoplasm of upper lobe, unspecified bronchus or lung: Secondary | ICD-10-CM | POA: Diagnosis not present

## 2014-04-17 NOTE — Progress Notes (Signed)
[facility] Simulation and Treatment Planning Note   Name: Michelle Levine MRN: [id number]  Date: 04/17/2014  DOB: 09-10-1922  Status: outpatient  DIAGNOSIS: Stage I NSCLC of the right upper lobe  SIDE: right  CONSENT VERIFIED: yes  SET UP AND IMMOBILIZATION: Patient is setup supine in a vac loc with a custom moldable pillow for head and neck immobilization   NARRATIVE: The patient was brought to the [facility].  Identity was confirmed.  All relevant records and images related to the planned course of therapy were reviewed.  Then, the patient was positioned in a stable reproducible clinical set-up for radiation therapy.  CT images were obtained.  Skin markings were placed.  A four dimensional simulation was then performed to track tumor movement throughout the patients' breathing cycle. The CT images were loaded into the planning software where the target and avoidance structures were contoured.  The GTV was outlined on the free breathing, 4D and MIP image sets.  The radiation prescription was entered and confirmed.   TREATMENT PLANNING NOTE:  Treatment planning then occurred. I have requested 3D simulation with [facility] of the spinal cord, total lungs and gross tumor volume. I have also requested mlcs and an isodose plan.   Special treatment procedure will be performed as [name] will be receiving high dose per fraction.

## 2014-04-24 ENCOUNTER — Encounter (HOSPITAL_COMMUNITY): Payer: Self-pay

## 2014-04-24 DIAGNOSIS — IMO0002 Reserved for concepts with insufficient information to code with codable children: Secondary | ICD-10-CM | POA: Diagnosis not present

## 2014-04-24 DIAGNOSIS — C3490 Malignant neoplasm of unspecified part of unspecified bronchus or lung: Secondary | ICD-10-CM | POA: Diagnosis not present

## 2014-04-24 DIAGNOSIS — M899 Disorder of bone, unspecified: Secondary | ICD-10-CM | POA: Diagnosis not present

## 2014-04-24 DIAGNOSIS — M949 Disorder of cartilage, unspecified: Secondary | ICD-10-CM | POA: Diagnosis not present

## 2014-04-24 DIAGNOSIS — E0590 Thyrotoxicosis, unspecified without thyrotoxic crisis or storm: Secondary | ICD-10-CM | POA: Diagnosis not present

## 2014-04-24 DIAGNOSIS — I509 Heart failure, unspecified: Secondary | ICD-10-CM | POA: Diagnosis not present

## 2014-04-24 DIAGNOSIS — R7301 Impaired fasting glucose: Secondary | ICD-10-CM | POA: Diagnosis not present

## 2014-04-24 DIAGNOSIS — I10 Essential (primary) hypertension: Secondary | ICD-10-CM | POA: Diagnosis not present

## 2014-04-25 DIAGNOSIS — C3410 Malignant neoplasm of upper lobe, unspecified bronchus or lung: Secondary | ICD-10-CM | POA: Diagnosis not present

## 2014-04-25 DIAGNOSIS — Z510 Encounter for antineoplastic radiation therapy: Secondary | ICD-10-CM | POA: Diagnosis not present

## 2014-04-29 ENCOUNTER — Ambulatory Visit
Admission: RE | Admit: 2014-04-29 | Discharge: 2014-04-29 | Disposition: A | Discharge status: Home / Self Care | Payer: Medicare Other | Source: Ambulatory Visit | Attending: Radiation Oncology | Admitting: Radiation Oncology

## 2014-04-29 DIAGNOSIS — Z510 Encounter for antineoplastic radiation therapy: Secondary | ICD-10-CM | POA: Diagnosis not present

## 2014-04-29 DIAGNOSIS — C3411 Malignant neoplasm of upper lobe, right bronchus or lung: Secondary | ICD-10-CM

## 2014-04-29 DIAGNOSIS — C3410 Malignant neoplasm of upper lobe, unspecified bronchus or lung: Secondary | ICD-10-CM | POA: Diagnosis not present

## 2014-04-29 NOTE — Progress Notes (Signed)
Weekly assessment of SBRT x 3 treatments.Denies pain or shortness of breath.Productive clear to yellow  oral secretions over the last 2 months.routine of clinic reviewed.

## 2014-04-29 NOTE — Progress Notes (Signed)
Weekly Management Note Current Dose: 18  Gy  Projected Dose: 54 Gy   Narrative:  The patient presents for routine under treatment assessment.  CBCT/MVCT images/Port film x-rays were reviewed.  The chart was checked. Doing well. No complaints.   Physical Findings: Weight:  . Unchanged  Impression:  The patient is tolerating radiation.  Plan:  Continue treatment as planned.

## 2014-04-29 NOTE — Progress Notes (Signed)
  Radiation Oncology         [phone] ________________________________  Name: Michelle Levine MRN: [id number]  Date: 04/29/2014  DOB: 1922/07/23  Stereotactic Body Radiotherapy Treatment Procedure Note (Fraction 1/3)  NARRATIVE:  Michelle Levine was brought to the stereotactic radiation treatment machine and placed supine on the CT couch. The patient was set up for stereotactic body radiotherapy on the body fix pillow.  3D TREATMENT PLANNING AND DOSIMETRY:  The patient's radiation plan was reviewed and approved prior to starting treatment.  It showed 3-dimensional radiation distributions overlaid onto the planning CT.  The [facility] for the target structures as well as the organs at risk were reviewed. The documentation of this is filed in the radiation oncology EMR.  SIMULATION VERIFICATION:  The patient underwent CT imaging on the treatment unit.  These were carefully aligned to document that the ablative radiation dose would cover the target volume and maximally spare the nearby organs at risk according to the planned distribution.  SPECIAL TREATMENT PROCEDURE: Michelle Levine received high dose ablative stereotactic body radiotherapy to the planned target volume without unforeseen complications. Treatment was delivered uneventfully. The high doses associated with stereotactic body radiotherapy and the significant potential risks require careful treatment set up and patient monitoring constituting a special treatment procedure   STEREOTACTIC TREATMENT MANAGEMENT:  Following delivery, the patient was evaluated clinically. The patient tolerated treatment without significant acute effects, and was discharged to home in stable condition.    PLAN: Continue treatment as planned.  _________________________   Thea Silversmith, MD

## 2014-05-01 ENCOUNTER — Ambulatory Visit
Admission: RE | Admit: 2014-05-01 | Discharge: 2014-05-01 | Disposition: A | Discharge status: Home / Self Care | Payer: Medicare Other | Source: Ambulatory Visit | Attending: Radiation Oncology | Admitting: Radiation Oncology

## 2014-05-01 DIAGNOSIS — C3411 Malignant neoplasm of upper lobe, right bronchus or lung: Secondary | ICD-10-CM

## 2014-05-01 DIAGNOSIS — Z510 Encounter for antineoplastic radiation therapy: Secondary | ICD-10-CM | POA: Diagnosis not present

## 2014-05-01 DIAGNOSIS — C3410 Malignant neoplasm of upper lobe, unspecified bronchus or lung: Secondary | ICD-10-CM | POA: Diagnosis not present

## 2014-05-02 NOTE — Progress Notes (Signed)
  Radiation Oncology         [phone] ________________________________  Name: Michelle Levine MRN: [id number]  Date: 05/01/2014  DOB: 1922-10-06  Stereotactic Body Radiotherapy Treatment Procedure Note  NARRATIVE:  Michelle Levine was brought to the stereotactic radiation treatment machine and placed supine on the CT couch. The patient was set up for stereotactic body radiotherapy on the body fix pillow.  3D TREATMENT PLANNING AND DOSIMETRY:  The patient's radiation plan was reviewed and approved prior to starting treatment.  It showed 3-dimensional radiation distributions overlaid onto the planning CT.  The [facility] for the target structures as well as the organs at risk were reviewed. The documentation of this is filed in the radiation oncology EMR.  SIMULATION VERIFICATION:  The patient underwent CT imaging on the treatment unit.  These were carefully aligned to document that the ablative radiation dose would cover the target volume and maximally spare the nearby organs at risk according to the planned distribution.  SPECIAL TREATMENT PROCEDURE: Michelle Levine received high dose ablative stereotactic body radiotherapy to the planned target volume without unforeseen complications. Treatment was delivered uneventfully. The high doses associated with stereotactic body radiotherapy and the significant potential risks require careful treatment set up and patient monitoring constituting a special treatment procedure   STEREOTACTIC TREATMENT MANAGEMENT:  Following delivery, the patient was evaluated clinically. The patient tolerated treatment without significant acute effects, and was discharged to home in stable condition.    PLAN: Continue treatment as planned.  _________________________   Thea Silversmith, MD

## 2014-05-05 ENCOUNTER — Other Ambulatory Visit (HOSPITAL_BASED_OUTPATIENT_CLINIC_OR_DEPARTMENT_OTHER): Payer: Medicare Other

## 2014-05-05 ENCOUNTER — Encounter: Payer: Self-pay | Admitting: Internal Medicine

## 2014-05-05 ENCOUNTER — Ambulatory Visit (HOSPITAL_BASED_OUTPATIENT_CLINIC_OR_DEPARTMENT_OTHER): Payer: Medicare Other | Admitting: Internal Medicine

## 2014-05-05 VITALS — BP 127/67 | HR 64 | Temp 98.3°F | Resp 18 | Ht 65.0 in | Wt 124.6 lb

## 2014-05-05 DIAGNOSIS — R911 Solitary pulmonary nodule: Secondary | ICD-10-CM | POA: Diagnosis not present

## 2014-05-05 DIAGNOSIS — C3410 Malignant neoplasm of upper lobe, unspecified bronchus or lung: Secondary | ICD-10-CM | POA: Diagnosis not present

## 2014-05-05 DIAGNOSIS — C3490 Malignant neoplasm of unspecified part of unspecified bronchus or lung: Secondary | ICD-10-CM

## 2014-05-05 LAB — CBC WITH DIFFERENTIAL/PLATELET
BASO%: 1.1 % (ref 0.0–2.0)
Basophils Absolute: 0.1 10*3/uL (ref 0.0–0.1)
EOS ABS: 0.3 10*3/uL (ref 0.0–0.5)
EOS%: 3.5 % (ref 0.0–7.0)
HCT: 39 % (ref 34.8–46.6)
HGB: 13 g/dL (ref 11.6–15.9)
LYMPH%: 15.6 % (ref 14.0–49.7)
MCH: 30.7 pg (ref 25.1–34.0)
MCHC: 33.3 g/dL (ref 31.5–36.0)
MCV: 92.3 fL (ref 79.5–101.0)
MONO#: 0.8 10*3/uL (ref 0.1–0.9)
MONO%: 9.1 % (ref 0.0–14.0)
NEUT%: 70.7 % (ref 38.4–76.8)
NEUTROS ABS: 6.1 10*3/uL (ref 1.5–6.5)
PLATELETS: 203 10*3/uL (ref 145–400)
RBC: 4.23 10*6/uL (ref 3.70–5.45)
RDW: 13.9 % (ref 11.2–14.5)
WBC: 8.6 10*3/uL (ref 3.9–10.3)
lymph#: 1.3 10*3/uL (ref 0.9–3.3)

## 2014-05-05 LAB — COMPREHENSIVE METABOLIC PANEL (CC13)
ALT: 13 U/L (ref 0–55)
AST: 21 U/L (ref 5–34)
Albumin: 3.3 g/dL — ABNORMAL LOW (ref 3.5–5.0)
Alkaline Phosphatase: 62 U/L (ref 40–150)
Anion Gap: 9 mEq/L (ref 3–11)
BILIRUBIN TOTAL: 0.64 mg/dL (ref 0.20–1.20)
BUN: 15.2 mg/dL (ref 7.0–26.0)
CO2: 29 mEq/L (ref 22–29)
Calcium: 9.5 mg/dL (ref 8.4–10.4)
Chloride: 104 mEq/L (ref 98–109)
Creatinine: 0.8 mg/dL (ref 0.6–1.1)
GLUCOSE: 110 mg/dL (ref 70–140)
Potassium: 4.1 mEq/L (ref 3.5–5.1)
Sodium: 142 mEq/L (ref 136–145)
TOTAL PROTEIN: 6.9 g/dL (ref 6.4–8.3)

## 2014-05-05 NOTE — Progress Notes (Signed)
[place] Telephone:([phone]) [phone]   Fax:([phone]) [phone]  OFFICE PROGRESS NOTE  Jerlyn Ly, MD [place] [address]  DIAGNOSIS: non-small cell lung cancer, adenocarcinoma with multiple pulmonary nodules but at least a stage IA (T1a, N0, M0) involving the right upper lobe, with other bilateral pulmonary nodule suspicious for metastatic disease or synchronous primaries.  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 Negative for EGFR, ALK, RET, BRAF, KRAS, ERBB2.  PRIOR THERAPY: Stereotactic radiotherapy to the right upper lobe lung nodule expected to be completed on 05/06/2014  CURRENT THERAPY: None.  INTERVAL HISTORY: Michelle Levine 80 y.o. female returns to the clinic today for followup visit. The patient is currently undergoing stereotactic radiotherapy to the right upper lobe lung nodule under the care of Dr. Pablo Ledger and tolerating it fairly well. The tissue block from the biopsy was sent to [facility] one. The final report showed positive MET mutation. It was negative for EGFR mutation as well as ALK gene translocation. The patient is feeling fine today with no specific complaints. She denied having any significant fatigue or weakness. She has no chest pain, shortness breath, cough or hemoptysis. She has no nausea or vomiting, no fever or chills. She has no significant weight loss or night sweats. She is here today for evaluation and discussion of her treatment options.  MEDICAL HISTORY: Past Medical History  Diagnosis Date  . Aortic insufficiency   . Mitral insufficiency   . Tricuspid regurgitation   . Pulmonary hypertension   . CHF (congestive heart failure)   . COPD (chronic obstructive pulmonary disease)   . Hypertension   . Cataracts, bilateral   . CAD (coronary artery disease)   . Thyroid disease     ALLERGIES:  has No Known Allergies.  MEDICATIONS:  Current Outpatient  Prescriptions  Medication Sig Dispense Refill  . amLODipine (NORVASC) 5 MG tablet Take 5 mg by mouth daily.      Marland Kitchen aspirin EC 81 MG tablet Take 81 mg by mouth 3 (three) times a week. Monday, Wednesday and Friday      . Calcium Carbonate-Vitamin D (CALCIUM + D PO) Take 1 capsule by mouth daily.       . citalopram (CELEXA) 20 MG tablet Take 20 mg by mouth daily.       . enalapril (VASOTEC) 20 MG tablet Take 1 tablet (20 mg total) by mouth 2 (two) times daily.  60 tablet  12  . fish oil-omega-3 fatty acids 1000 MG capsule Take 2 g by mouth every evening.       . furosemide (LASIX) 40 MG tablet Take 40 mg by mouth daily.       Marland Kitchen KLOR-CON M10 10 MEQ tablet Take 10 mEq by mouth daily.      . methimazole (TAPAZOLE) 10 MG tablet Take 5 mg by mouth daily.       . Multiple Vitamin (MULITIVITAMIN WITH MINERALS) TABS Take 1 tablet by mouth daily.       . nadolol (CORGARD) 40 MG tablet Take 20 mg by mouth daily.       . zoledronic acid (RECLAST) 5 MG/100ML SOLN Inject 5 mg into the vein. Once every (2) two years - last dose in 2012? (done at [facility])       No current facility-administered medications for this visit.    SURGICAL HISTORY:  Past Surgical History  Procedure Laterality Date  .  Cataract extraction    . Breast lumpectomy    . Appendectomy    . Abdominal hysterectomy    . Eye surgery      REVIEW OF SYSTEMS:  Constitutional: negative Eyes: negative Ears, nose, mouth, throat, and face: negative Respiratory: negative Cardiovascular: negative Gastrointestinal: negative Genitourinary:negative Integument/breast: negative Hematologic/lymphatic: negative Musculoskeletal:negative Neurological: negative Behavioral/Psych: negative Endocrine: negative Allergic/Immunologic: negative   PHYSICAL EXAMINATION: General appearance: alert, cooperative and no distress Head: Normocephalic, without obvious abnormality, atraumatic Neck: no adenopathy, no JVD, supple, symmetrical,  trachea midline and thyroid not enlarged, symmetric, no tenderness/mass/nodules Lymph nodes: Cervical, supraclavicular, and axillary nodes normal. Resp: clear to auscultation bilaterally Back: symmetric, no curvature. ROM normal. No CVA tenderness. Cardio: regular rate and rhythm, S1, S2 normal, no murmur, click, rub or gallop GI: soft, non-tender; bowel sounds normal; no masses,  no organomegaly Extremities: extremities normal, atraumatic, no cyanosis or edema Neurologic: Alert and oriented X 3, normal strength and tone. Normal symmetric reflexes. Normal coordination and gait  ECOG PERFORMANCE STATUS: 1 - Symptomatic but completely ambulatory  There were no vitals taken for this visit.  LABORATORY DATA: Lab Results  Component Value Date   WBC 8.5 03/20/2014   HGB 13.5 03/20/2014   HCT 40.8 03/20/2014   MCV 90.5 03/20/2014   PLT 226 03/20/2014      Chemistry      Component Value Date/Time   NA 143 03/20/2014 1328   NA 138 12/06/2013 1440   K 4.1 03/20/2014 1328   K 3.6 12/06/2013 1440   CL 105 12/06/2013 1440   CO2 23 03/20/2014 1328   CO2 27 12/06/2013 1440   BUN 23.3 03/20/2014 1328   BUN 20 12/06/2013 1440   CREATININE 0.9 03/20/2014 1328   CREATININE 0.8 12/06/2013 1440      Component Value Date/Time   CALCIUM 9.4 03/20/2014 1328   CALCIUM 8.9 12/06/2013 1440   ALKPHOS 44 03/20/2014 1328   ALKPHOS 76 03/22/2010 0450   AST 20 03/20/2014 1328   AST 23 03/22/2010 0450   ALT 15 03/20/2014 1328   ALT 29 03/22/2010 0450   BILITOT 0.28 03/20/2014 1328   BILITOT 0.6 03/22/2010 0450       RADIOGRAPHIC STUDIES: No results found. ASSESSMENT AND PLAN: This is a very pleasant 80 years old white female recently diagnosed with metastatic non-small cell lung cancer, adenocarcinoma with right upper lobe lung nodule in addition to smaller bilateral pulmonary nodules and positive MET mutation.  She is currently undergoing a stereotactic radiotherapy to the right upper lobe pulmonary nodule under the  care of Dr. Pablo Ledger.  She is tolerating her treatment well. I recommended for her to complete the course of the radiotherapy as scheduled.  I would see her back for followup visit in 3 months with repeat CT scan of the chest for reevaluation of her disease. If the patient has any evidence for disease progression in the future, she may be considered for systemic chemotherapy or Xalkori as a treatment for the MET mutation She was advised to call immediately if she has any concerning symptoms in the interval.  The patient voices understanding of current disease status and treatment options and is in agreement with the current care plan.  All questions were answered. The patient knows to call the clinic with any problems, questions or concerns. We can certainly see the patient much sooner if necessary.  I spent 15 minutes counseling the patient face to face. The total time spent in the appointment was 25  minutes.  Disclaimer: This note was dictated with voice recognition software. Similar sounding words can inadvertently be transcribed and may not be corrected upon review.

## 2014-05-06 ENCOUNTER — Ambulatory Visit: Payer: Medicare Other | Admitting: Radiation Oncology

## 2014-05-06 ENCOUNTER — Encounter: Payer: Self-pay | Admitting: Radiation Oncology

## 2014-05-06 ENCOUNTER — Ambulatory Visit
Admission: RE | Admit: 2014-05-06 | Discharge: 2014-05-06 | Disposition: A | Discharge status: Home / Self Care | Payer: Medicare Other | Source: Ambulatory Visit | Attending: Radiation Oncology | Admitting: Radiation Oncology

## 2014-05-06 ENCOUNTER — Telehealth: Payer: Self-pay | Admitting: Internal Medicine

## 2014-05-06 DIAGNOSIS — C3410 Malignant neoplasm of upper lobe, unspecified bronchus or lung: Secondary | ICD-10-CM | POA: Diagnosis not present

## 2014-05-06 DIAGNOSIS — Z510 Encounter for antineoplastic radiation therapy: Secondary | ICD-10-CM | POA: Diagnosis not present

## 2014-05-06 DIAGNOSIS — C3411 Malignant neoplasm of upper lobe, right bronchus or lung: Secondary | ICD-10-CM

## 2014-05-06 NOTE — Progress Notes (Signed)
  Radiation Oncology         [phone] ________________________________  Name: Michelle Levine MRN: [id number]  Date: 05/06/2014  DOB: [birth year]  Stereotactic Body Radiotherapy Treatment Procedure Note  NARRATIVE:  Michelle Levine was brought to the stereotactic radiation treatment machine and placed supine on the CT couch. The patient was set up for stereotactic body radiotherapy on the body fix pillow.  3D TREATMENT PLANNING AND DOSIMETRY:  The patient's radiation plan was reviewed and approved prior to starting treatment.  It showed 3-dimensional radiation distributions overlaid onto the planning CT.  The [facility] for the target structures as well as the organs at risk were reviewed. The documentation of this is filed in the radiation oncology EMR.  SIMULATION VERIFICATION:  The patient underwent CT imaging on the treatment unit.  These were carefully aligned to document that the ablative radiation dose would cover the target volume and maximally spare the nearby organs at risk according to the planned distribution.  SPECIAL TREATMENT PROCEDURE: Michelle Levine received high dose ablative stereotactic body radiotherapy to the planned target volume without unforeseen complications. Treatment was delivered uneventfully. The high doses associated with stereotactic body radiotherapy and the significant potential risks require careful treatment set up and patient monitoring constituting a special treatment procedure   STEREOTACTIC TREATMENT MANAGEMENT:  Following delivery, the patient was evaluated clinically. The patient tolerated treatment without significant acute effects, and was discharged to home in stable condition.    PLAN: Continue treatment as planned.  I left a message for her granddaughter, [name], on her cell phone at the patient's request. I left a generic message as there was not a voicemail with a name. She is doing well as expected and I will see her back in a month.    _________________________   Thea Silversmith, MD

## 2014-05-06 NOTE — Progress Notes (Signed)
  Radiation Oncology         [phone] ________________________________  Name: Michelle Levine MRN: [id number]  Date: 05/06/2014  DOB: 1922/02/28  End of Treatment Note  Diagnosis:   T1N0 NSCLC of the right upper lobe     Indication for treatment:  Curative       Radiation treatment dates:   04/29/2014, 05/01/2014, 05/06/2014  Site/dose:   Right upper lobe/ 54 Gy in 3 fractions at 18 gy per fraction   Beams/energy:   3 dynamic conformal arcs using 6 MV photons delievere din filer free mode to expedite treatment.   Narrative: The patient tolerated radiation treatment relatively well.   She had no ill effects from treatment.   Plan: The patient has completed radiation treatment. The patient will return to radiation oncology clinic for routine followup in one month. I advised them to call or return sooner if they have any questions or concerns related to their recovery or treatment.  ------------------------------------------------  Thea Silversmith, MD

## 2014-05-19 DIAGNOSIS — J449 Chronic obstructive pulmonary disease, unspecified: Secondary | ICD-10-CM | POA: Diagnosis not present

## 2014-05-19 DIAGNOSIS — C3490 Malignant neoplasm of unspecified part of unspecified bronchus or lung: Secondary | ICD-10-CM | POA: Diagnosis not present

## 2014-05-19 DIAGNOSIS — I10 Essential (primary) hypertension: Secondary | ICD-10-CM | POA: Diagnosis not present

## 2014-05-19 DIAGNOSIS — R059 Cough, unspecified: Secondary | ICD-10-CM | POA: Diagnosis not present

## 2014-05-19 DIAGNOSIS — IMO0002 Reserved for concepts with insufficient information to code with codable children: Secondary | ICD-10-CM | POA: Diagnosis not present

## 2014-05-19 DIAGNOSIS — R05 Cough: Secondary | ICD-10-CM | POA: Diagnosis not present

## 2014-05-26 DIAGNOSIS — C3490 Malignant neoplasm of unspecified part of unspecified bronchus or lung: Secondary | ICD-10-CM | POA: Diagnosis not present

## 2014-05-26 DIAGNOSIS — R059 Cough, unspecified: Secondary | ICD-10-CM | POA: Diagnosis not present

## 2014-05-26 DIAGNOSIS — R05 Cough: Secondary | ICD-10-CM | POA: Diagnosis not present

## 2014-05-26 DIAGNOSIS — IMO0002 Reserved for concepts with insufficient information to code with codable children: Secondary | ICD-10-CM | POA: Diagnosis not present

## 2014-06-03 ENCOUNTER — Ambulatory Visit (INDEPENDENT_AMBULATORY_CARE_PROVIDER_SITE_OTHER): Payer: Medicare Other | Admitting: Cardiology

## 2014-06-03 ENCOUNTER — Encounter: Payer: Self-pay | Admitting: Cardiology

## 2014-06-03 VITALS — BP 122/80 | HR 81 | Ht 65.0 in | Wt 126.0 lb

## 2014-06-03 DIAGNOSIS — I351 Nonrheumatic aortic (valve) insufficiency: Secondary | ICD-10-CM

## 2014-06-03 DIAGNOSIS — I359 Nonrheumatic aortic valve disorder, unspecified: Secondary | ICD-10-CM

## 2014-06-03 DIAGNOSIS — I10 Essential (primary) hypertension: Secondary | ICD-10-CM

## 2014-06-03 NOTE — Patient Instructions (Signed)
Your physician wants you to follow-up in: 6 MONTHS WITH DR CRENSHAW You will receive a reminder letter in the mail two months in advance. If you don't receive a letter, please call our office to schedule the follow-up appointment.  

## 2014-06-03 NOTE — Progress Notes (Signed)
HPI: FU aortic insufficiency, mitral insufficiency, and tricuspid regurgitation with moderate pulmonary hypertension. Last echocardiogram in September 2014 showed an ejection fraction of 50-55%, moderate aortic insufficiency, by leaflet mitral valve prolapse with moderate mitral regurgitation, moderate tricuspid regurgitation and severe left atrial enlargement. Patient Has been diagnosed with lung cancer. Since I last saw her, the patient denies any dyspnea on exertion, orthopnea, PND, pedal edema, palpitations, syncope or chest pain.   Current Outpatient Prescriptions  Medication Sig Dispense Refill  . amLODipine (NORVASC) 5 MG tablet Take 5 mg by mouth daily.      Marland Kitchen aspirin EC 81 MG tablet Take 81 mg by mouth 3 (three) times a week. Monday, Wednesday and Friday      . benzonatate (TESSALON) 100 MG capsule As needed      . Calcium Carbonate-Vitamin D (CALCIUM + D PO) Take 1 capsule by mouth daily.       . citalopram (CELEXA) 20 MG tablet Take 20 mg by mouth daily.       . fish oil-omega-3 fatty acids 1000 MG capsule Take 2 g by mouth every evening.       . furosemide (LASIX) 40 MG tablet Take 40 mg by mouth daily.       Marland Kitchen KLOR-CON M10 10 MEQ tablet Take 10 mEq by mouth daily.      . methimazole (TAPAZOLE) 10 MG tablet Take 5 mg by mouth daily.       . Multiple Vitamin (MULITIVITAMIN WITH MINERALS) TABS Take 1 tablet by mouth daily.       . nadolol (CORGARD) 40 MG tablet Take 20 mg by mouth daily.       Marland Kitchen telmisartan (MICARDIS) 40 MG tablet 1 tab daily      . zoledronic acid (RECLAST) 5 MG/100ML SOLN Inject 5 mg into the vein. Once every (2) two years - last dose in 2012? (done at [facility])       No current facility-administered medications for this visit.     Past Medical History  Diagnosis Date  . Aortic insufficiency   . Mitral insufficiency   . Tricuspid regurgitation   . Pulmonary hypertension   . CHF (congestive heart failure)   . COPD (chronic obstructive  pulmonary disease)   . Hypertension   . Cataracts, bilateral   . CAD (coronary artery disease)   . Thyroid disease     Past Surgical History  Procedure Laterality Date  . Cataract extraction    . Breast lumpectomy    . Appendectomy    . Abdominal hysterectomy    . Eye surgery      History   Social History  . Marital Status: Married    Spouse Name: N/A    Number of Children: N/A  . Years of Education: N/A   Occupational History  . retired    Social History Main Topics  . Smoking status: Never Smoker   . Smokeless tobacco: Never Used  . Alcohol Use: No  . Drug Use: No  . Sexual Activity: Not on file   Other Topics Concern  . Not on file   Social History Narrative  . No narrative on file    ROS: no fevers or chills, productive cough, hemoptysis, dysphasia, odynophagia, melena, hematochezia, dysuria, hematuria, rash, seizure activity, orthopnea, PND, pedal edema, claudication. Remaining systems are negative.  Physical Exam: Well-developed well-nourished in no acute distress.  Skin is warm and dry.  HEENT is normal.  Neck is  supple.  Chest is clear to auscultation with normal expansion.  Cardiovascular exam is regular rate and rhythm. 2/6 systolic murmur apex. Abdominal exam nontender or distended. No masses palpated. Extremities show no edema. neuro grossly intact  ECG Sinus rhythm at a rate of 61. First degree AV block. Nonspecific ST changes.

## 2014-06-03 NOTE — Assessment & Plan Note (Signed)
Blood pressure controlled. Continue present medications. 

## 2014-06-03 NOTE — Assessment & Plan Note (Signed)
Patient has moderate aortic insufficiency, moderate mitral regurgitation and moderate tricuspid regurgitation. Given age we will treat conservatively. She is clear that she would never consider valve surgery regardless. Will not repeat echocardiogram. Continue present dose of Lasix.

## 2014-07-03 ENCOUNTER — Ambulatory Visit (INDEPENDENT_AMBULATORY_CARE_PROVIDER_SITE_OTHER)
Admission: RE | Admit: 2014-07-03 | Discharge: 2014-07-03 | Disposition: A | Discharge status: Home / Self Care | Payer: Medicare Other | Source: Ambulatory Visit | Attending: Pulmonary Disease | Admitting: Pulmonary Disease

## 2014-07-03 ENCOUNTER — Ambulatory Visit: Payer: Medicare Other | Admitting: Pulmonary Disease

## 2014-07-03 ENCOUNTER — Ambulatory Visit (INDEPENDENT_AMBULATORY_CARE_PROVIDER_SITE_OTHER): Payer: Medicare Other | Admitting: Pulmonary Disease

## 2014-07-03 ENCOUNTER — Encounter: Payer: Self-pay | Admitting: Pulmonary Disease

## 2014-07-03 VITALS — BP 134/72 | HR 60 | Ht 65.0 in | Wt 127.0 lb

## 2014-07-03 DIAGNOSIS — R059 Cough, unspecified: Secondary | ICD-10-CM

## 2014-07-03 DIAGNOSIS — J449 Chronic obstructive pulmonary disease, unspecified: Secondary | ICD-10-CM | POA: Diagnosis not present

## 2014-07-03 DIAGNOSIS — R05 Cough: Secondary | ICD-10-CM

## 2014-07-03 DIAGNOSIS — R053 Chronic cough: Secondary | ICD-10-CM

## 2014-07-03 MED ORDER — AZITHROMYCIN 250 MG PO TABS: ORAL_TABLET | ORAL | Status: DC

## 2014-07-03 NOTE — Progress Notes (Signed)
Quick Note:  Spoke with pt and notified of results per Dr. Elsworth Soho Pt verbalized understanding and denied any questions.  ______

## 2014-07-03 NOTE — Assessment & Plan Note (Signed)
Doubt she has significant COPD since she has never smoked. We'll perform spirometry at next visit if cough is persistent, based on CT results

## 2014-07-03 NOTE — Patient Instructions (Signed)
We will treat your for bronchitis CXR today Zpak Take Mucinex DM 600 twice daily & delsym cough syrup as needed thrice daily

## 2014-07-03 NOTE — Progress Notes (Signed)
Subjective:    Patient ID: Michelle Levine, female    DOB: Dec 01, 1921, 80 y.o.   MRN: [id number]  HPI 80 year old never smoker presents for evaluation of cough for 3 months. She was diagnosed in 4/15 with non-small cell lung cancer, adenocarcinoma. She presented with multiple pulmonary nodules but at least a stage IA (T1a, N0, M0) dominant 1.8 nodule in the right upper lobe, with other smaller bilateral pulmonary nodule suspicious for metastatic disease or synchronous primaries. The other subcentimeter nodules and a precarinal lymph node were negative on PET.  Foundation One markers were positive for MET mutation, but negative for EGFR or  ALK. She underwent stereotactic radiotherapy to the right upper lobe lung nodule in 78/2015. She developed this cough, productive of yellow-green sputum, she denies URI symptoms at onset, or postnasal drip or reflux symptoms. The cough preceded radiation therapy. She was given an antibiotic initially. Tessalon Perles have not provided any relief.  I do note that she is on furosemide for valvular regurgitation. Echo in 9/14 showed moderate MR, moderate TR with RVSP of 47. She denies overt symptoms of orthopnea, pedal edema or paroxysmal nocturnal dyspnea. She does not seem to have significant dyspnea and exertion, infectious states that she went for a long walk this morning and seems to be quite functional for her age. She denies wheezing.  Past Medical History  Diagnosis Date  . Aortic insufficiency   . Mitral insufficiency   . Tricuspid regurgitation   . Pulmonary hypertension   . CHF (congestive heart failure)   . COPD (chronic obstructive pulmonary disease)   . Hypertension   . Cataracts, bilateral   . CAD (coronary artery disease)   . Thyroid disease     Past Surgical History  Procedure Laterality Date  . Cataract extraction    . Breast lumpectomy    . Appendectomy    . Abdominal hysterectomy    . Eye surgery      No Known  Allergies   History   Social History  . Marital Status: Married    Spouse Name: N/A    Number of Children: N/A  . Years of Education: N/A   Occupational History  . retired    Social History Main Topics  . Smoking status: Never Smoker   . Smokeless tobacco: Never Used  . Alcohol Use: No  . Drug Use: No  . Sexual Activity: Not on file   Other Topics Concern  . Not on file   Social History Narrative  . No narrative on file    Family History  Problem Relation Age of Onset  . Heart attack Mother 52  . Pneumonia Father 72  . Heart attack Brother   . Leukemia Brother   . Heart attack Brother   . Leukemia Brother       Review of Systems  Constitutional: Negative for fever and unexpected weight change.  HENT: Positive for congestion. Negative for dental problem, ear pain, nosebleeds, postnasal drip, rhinorrhea, sinus pressure, sneezing, sore throat and trouble swallowing.   Eyes: Negative for redness and itching.  Respiratory: Positive for cough. Negative for chest tightness, shortness of breath and wheezing.   Cardiovascular: Negative for palpitations and leg swelling.  Gastrointestinal: Negative for nausea and vomiting.  Genitourinary: Negative for dysuria.  Musculoskeletal: Negative for joint swelling.  Skin: Negative for rash.  Neurological: Negative for headaches.  Hematological: Does not bruise/bleed easily.  Psychiatric/Behavioral: Negative for dysphoric mood. The patient is not nervous/anxious.  Objective:   Physical Exam  Gen. Pleasant, thin, in no distress, normal affect, elderly, younger than stated age ENT - no lesions, no post nasal drip Neck: No JVD, no thyromegaly, no carotid bruits Lungs: no use of accessory muscles, no dullness to percussion, clear without rales or rhonchi  Cardiovascular: Rhythm regular, heart sounds  normal, no murmurs or gallops, no peripheral edema Abdomen: soft and non-tender, no hepatosplenomegaly, BS  normal. Musculoskeletal: No deformities, no cyanosis or clubbing Neuro:  alert, non focal       Assessment & Plan:

## 2014-07-03 NOTE — Assessment & Plan Note (Signed)
FU CT scheduled for end sep Other nodules are suspicious & will look for progression

## 2014-07-03 NOTE — Assessment & Plan Note (Signed)
We will treat your for bronchitis, other suspects include GERD/ sinus drip But also concern for parenchymal disease - that CT should clarify, doubt this is side effect of SBRT, no evidence of overt heart failure CXR today Zpak Take Mucinex DM 600 twice daily & delsym cough syrup as needed thrice daily

## 2014-07-08 ENCOUNTER — Telehealth: Payer: Self-pay | Admitting: Medical Oncology

## 2014-07-08 NOTE — Telephone Encounter (Signed)
onc tx request sent.

## 2014-07-09 ENCOUNTER — Telehealth: Payer: Self-pay | Admitting: Internal Medicine

## 2014-07-09 NOTE — Telephone Encounter (Signed)
returned pt call and r/s appt per pt request....pt ok adn aware

## 2014-08-04 ENCOUNTER — Ambulatory Visit (HOSPITAL_COMMUNITY)
Admission: RE | Admit: 2014-08-04 | Discharge: 2014-08-04 | Disposition: A | Discharge status: Home / Self Care | Payer: Medicare Other | Source: Ambulatory Visit | Attending: Internal Medicine | Admitting: Internal Medicine

## 2014-08-04 ENCOUNTER — Other Ambulatory Visit (HOSPITAL_BASED_OUTPATIENT_CLINIC_OR_DEPARTMENT_OTHER): Payer: Medicare Other

## 2014-08-04 DIAGNOSIS — C3410 Malignant neoplasm of upper lobe, unspecified bronchus or lung: Secondary | ICD-10-CM | POA: Diagnosis not present

## 2014-08-04 DIAGNOSIS — C3490 Malignant neoplasm of unspecified part of unspecified bronchus or lung: Secondary | ICD-10-CM | POA: Diagnosis not present

## 2014-08-04 LAB — COMPREHENSIVE METABOLIC PANEL (CC13)
ALT: 9 U/L (ref 0–55)
ANION GAP: 8 meq/L (ref 3–11)
AST: 19 U/L (ref 5–34)
Albumin: 3.1 g/dL — ABNORMAL LOW (ref 3.5–5.0)
Alkaline Phosphatase: 70 U/L (ref 40–150)
BILIRUBIN TOTAL: 0.75 mg/dL (ref 0.20–1.20)
BUN: 14.6 mg/dL (ref 7.0–26.0)
CO2: 30 meq/L — AB (ref 22–29)
Calcium: 9.5 mg/dL (ref 8.4–10.4)
Chloride: 105 mEq/L (ref 98–109)
Creatinine: 0.9 mg/dL (ref 0.6–1.1)
GLUCOSE: 105 mg/dL (ref 70–140)
Potassium: 3.9 mEq/L (ref 3.5–5.1)
Sodium: 143 mEq/L (ref 136–145)
Total Protein: 7.2 g/dL (ref 6.4–8.3)

## 2014-08-04 LAB — CBC WITH DIFFERENTIAL/PLATELET
BASO%: 1 % (ref 0.0–2.0)
Basophils Absolute: 0.1 10*3/uL (ref 0.0–0.1)
EOS%: 4.5 % (ref 0.0–7.0)
Eosinophils Absolute: 0.4 10*3/uL (ref 0.0–0.5)
HCT: 40.9 % (ref 34.8–46.6)
HGB: 13.5 g/dL (ref 11.6–15.9)
LYMPH%: 15.7 % (ref 14.0–49.7)
MCH: 30 pg (ref 25.1–34.0)
MCHC: 32.9 g/dL (ref 31.5–36.0)
MCV: 91.2 fL (ref 79.5–101.0)
MONO#: 0.8 10*3/uL (ref 0.1–0.9)
MONO%: 8.8 % (ref 0.0–14.0)
NEUT%: 70 % (ref 38.4–76.8)
NEUTROS ABS: 6.2 10*3/uL (ref 1.5–6.5)
PLATELETS: 220 10*3/uL (ref 145–400)
RBC: 4.49 10*6/uL (ref 3.70–5.45)
RDW: 13.1 % (ref 11.2–14.5)
WBC: 8.8 10*3/uL (ref 3.9–10.3)
lymph#: 1.4 10*3/uL (ref 0.9–3.3)

## 2014-08-04 MED ORDER — IOHEXOL 300 MG/ML  SOLN
80.0000 mL | Freq: Once | INTRAMUSCULAR | Status: AC | PRN
  Administered 2014-08-04: 80 mL via INTRAVENOUS

## 2014-08-05 DIAGNOSIS — Z23 Encounter for immunization: Secondary | ICD-10-CM | POA: Diagnosis not present

## 2014-08-11 ENCOUNTER — Ambulatory Visit: Payer: Medicare Other | Admitting: Internal Medicine

## 2014-08-12 ENCOUNTER — Telehealth: Payer: Self-pay | Admitting: Internal Medicine

## 2014-08-12 ENCOUNTER — Ambulatory Visit (HOSPITAL_BASED_OUTPATIENT_CLINIC_OR_DEPARTMENT_OTHER): Payer: Medicare Other | Admitting: Internal Medicine

## 2014-08-12 ENCOUNTER — Encounter: Payer: Self-pay | Admitting: Internal Medicine

## 2014-08-12 VITALS — BP 142/79 | HR 74 | Temp 97.9°F | Resp 18 | Ht 65.0 in | Wt 124.2 lb

## 2014-08-12 DIAGNOSIS — C3411 Malignant neoplasm of upper lobe, right bronchus or lung: Secondary | ICD-10-CM | POA: Diagnosis not present

## 2014-08-12 DIAGNOSIS — C7802 Secondary malignant neoplasm of left lung: Secondary | ICD-10-CM | POA: Diagnosis not present

## 2014-08-12 DIAGNOSIS — C3491 Malignant neoplasm of unspecified part of right bronchus or lung: Secondary | ICD-10-CM

## 2014-08-12 DIAGNOSIS — C7801 Secondary malignant neoplasm of right lung: Secondary | ICD-10-CM

## 2014-08-12 NOTE — Telephone Encounter (Signed)
, °

## 2014-08-12 NOTE — Progress Notes (Signed)
[place] [phone]   [phone]  OFFICE PROGRESS NOTE  Jerlyn Ly, MD [place] [address]  DIAGNOSIS: non-small cell lung cancer, adenocarcinoma with multiple pulmonary nodules but at least a stage IA (T1a, N0, M0) involving the right upper lobe, with other bilateral pulmonary nodule suspicious for metastatic disease or synchronous primaries.  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 Negative for EGFR, ALK, RET, BRAF, KRAS, ERBB2.  PRIOR THERAPY: Stereotactic radiotherapy to the right upper lobe lung nodule expected to be completed on 05/06/2014  CURRENT THERAPY: None.  INTERVAL HISTORY: Michelle Levine 80 y.o. female returns to the clinic today for followup visit accompanied by her son.  The patient is feeling fine today with no specific complaints except for mild cough. She denied having any significant fatigue or weakness. She has no chest pain, shortness of breath or hemoptysis. She has no nausea or vomiting, no fever or chills. She has no significant weight loss or night sweats. She had repeat CT scan of the chest performed recently and she is here for evaluation and discussion of her scan results.  MEDICAL HISTORY: Past Medical History  Diagnosis Date  . Aortic insufficiency   . Mitral insufficiency   . Tricuspid regurgitation   . Pulmonary hypertension   . CHF (congestive heart failure)   . COPD (chronic obstructive pulmonary disease)   . Hypertension   . Cataracts, bilateral   . CAD (coronary artery disease)   . Thyroid disease     ALLERGIES:  has No Known Allergies.  MEDICATIONS:  Current Outpatient Prescriptions  Medication Sig Dispense Refill  . amLODipine (NORVASC) 5 MG tablet Take 5 mg by mouth daily.      Marland Kitchen aspirin EC 81 MG tablet Take 81 mg by mouth 3 (three) times a week. Monday, Wednesday and Friday      . Calcium Carbonate-Vitamin D  (CALCIUM + D PO) Take 1 capsule by mouth daily.       . citalopram (CELEXA) 20 MG tablet Take 20 mg by mouth daily.       . fish oil-omega-3 fatty acids 1000 MG capsule Take 2 g by mouth every evening.       . fluticasone (FLONASE) 50 MCG/ACT nasal spray Place 2 sprays into both nostrils daily.      . furosemide (LASIX) 40 MG tablet Take 40 mg by mouth daily.       Marland Kitchen KLOR-CON M10 10 MEQ tablet Take 10 mEq by mouth daily.      . methimazole (TAPAZOLE) 10 MG tablet Take 5 mg by mouth daily.       . Multiple Vitamin (MULITIVITAMIN WITH MINERALS) TABS Take 1 tablet by mouth daily.       . nadolol (CORGARD) 40 MG tablet Take 20 mg by mouth daily.       Marland Kitchen omeprazole (PRILOSEC) 20 MG capsule Take 20 mg by mouth daily.      Marland Kitchen telmisartan (MICARDIS) 40 MG tablet 1 tab daily      . zoledronic acid (RECLAST) 5 MG/100ML SOLN Inject 5 mg into the vein. Once every (2) two years - last dose in 2012? (done at [facility])      . guaiFENesin (MUCINEX) 600 MG 12 hr tablet Take 600 mg by mouth 2 (two) times daily.       No current facility-administered medications for this visit.  SURGICAL HISTORY:  Past Surgical History  Procedure Laterality Date  . Cataract extraction    . Breast lumpectomy    . Appendectomy    . Abdominal hysterectomy    . Eye surgery      REVIEW OF SYSTEMS:  Constitutional: negative Eyes: negative Ears, nose, mouth, throat, and face: negative Respiratory: negative Cardiovascular: negative Gastrointestinal: negative Genitourinary:negative Integument/breast: negative Hematologic/lymphatic: negative Musculoskeletal:negative Neurological: negative Behavioral/Psych: negative Endocrine: negative Allergic/Immunologic: negative   PHYSICAL EXAMINATION: General appearance: alert, cooperative and no distress Head: Normocephalic, without obvious abnormality, atraumatic Neck: no adenopathy, no JVD, supple, symmetrical, trachea midline and thyroid not enlarged, symmetric,  no tenderness/mass/nodules Lymph nodes: Cervical, supraclavicular, and axillary nodes normal. Resp: clear to auscultation bilaterally Back: symmetric, no curvature. ROM normal. No CVA tenderness. Cardio: regular rate and rhythm, S1, S2 normal, no murmur, click, rub or gallop GI: soft, non-tender; bowel sounds normal; no masses,  no organomegaly Extremities: extremities normal, atraumatic, no cyanosis or edema Neurologic: Alert and oriented X 3, normal strength and tone. Normal symmetric reflexes. Normal coordination and gait  ECOG PERFORMANCE STATUS: 1 - Symptomatic but completely ambulatory  Blood pressure 142/79, pulse 74, temperature 97.9 F (36.6 C), temperature source Oral, resp. rate 18, height 5' 5" (1.651 m), weight 124 lb 3.2 oz (56.337 kg).  LABORATORY DATA: Lab Results  Component Value Date   WBC 8.8 08/04/2014   HGB 13.5 08/04/2014   HCT 40.9 08/04/2014   MCV 91.2 08/04/2014   PLT 220 08/04/2014      Chemistry      Component Value Date/Time   NA 143 08/04/2014 0823   NA 138 12/06/2013 1440   K 3.9 08/04/2014 0823   K 3.6 12/06/2013 1440   CL 105 12/06/2013 1440   CO2 30* 08/04/2014 0823   CO2 27 12/06/2013 1440   BUN 14.6 08/04/2014 0823   BUN 20 12/06/2013 1440   CREATININE 0.9 08/04/2014 0823   CREATININE 0.8 12/06/2013 1440      Component Value Date/Time   CALCIUM 9.5 08/04/2014 0823   CALCIUM 8.9 12/06/2013 1440   ALKPHOS 70 08/04/2014 0823   ALKPHOS 76 03/22/2010 0450   AST 19 08/04/2014 0823   AST 23 03/22/2010 0450   ALT 9 08/04/2014 0823   ALT 29 03/22/2010 0450   BILITOT 0.75 08/04/2014 0823   BILITOT 0.6 03/22/2010 0450       RADIOGRAPHIC STUDIES: Ct Chest W Contrast  08/04/2014   CLINICAL DATA:  Followup lung cancer  EXAM: CT CHEST WITH CONTRAST  TECHNIQUE: Multidetector CT imaging of the chest was performed during intravenous contrast administration.  CONTRAST:  80mL OMNIPAQUE IOHEXOL 300 MG/ML  SOLN  COMPARISON:  PET-CT from 03/26/2014  FINDINGS: Mediastinum:  The heart size appears normal. There is no pericardial effusion. Right paratracheal lymph node measures 9 mm, image 20/ series 2. Previously 1.2 cm. No enlarged or enlarging mediastinal or hilar lymph nodes identified. The trachea appears patent and is midline. The esophagus appears normal. There is no axillary or supraclavicular lymph nodes identified. Multi nodular thyroid gland is again noted.  Lungs/Pleura: No pleural effusion identified. Radiation change within the right upper lobe is new from previous exam. This occludes consolidation, bronchiectasis and fibrosis. Radiation change obscures the previous right upper lobe sub solid pulmonary nodule. Nodule within the right lower lobe measures 4 mm and is unchanged from previous exam.  Upper Abdomen: Incidental imaging stress set the visualized portions of the liver and spleen appear normal. Only the left adrenal gland   is visualized and appears normal.  Musculoskeletal: Review of the visualized osseous structures is significant for osteopenia and degenerative disc disease within the thoracic spine. No aggressive lytic or sclerotic bone lesions identified.  IMPRESSION: 1. No acute findings within the chest. New radiation change within the right upper lobe identified. The previous sub solid nodule in the right upper lobe is obscured by these changes. 2. No evidence for new or progressive disease identified within the chest.   Electronically Signed   By: Kerby Moors M.D.   On: 08/04/2014 10:36   ASSESSMENT AND PLAN: This is a very pleasant 80 years old white female recently diagnosed with metastatic non-small cell lung cancer, adenocarcinoma with right upper lobe lung nodule in addition to smaller bilateral pulmonary nodules and positive MET mutation.  She completed a stereotactic radiotherapy to the right upper lobe pulmonary nodule under the care of Dr. Pablo Ledger.  Her recent CT scan of the chest showed no evidence for disease progression. I discussed the  scan results with the patient and her son. I recommended for her to continue on observation with repeat CT scan of the chest in 6 months. She was advised to call immediately if she has any concerning symptoms in the interval.  The patient voices understanding of current disease status and treatment options and is in agreement with the current care plan.  All questions were answered. The patient knows to call the clinic with any problems, questions or concerns. We can certainly see the patient much sooner if necessary.  Disclaimer: This note was dictated with voice recognition software. Similar sounding words can inadvertently be transcribed and may not be corrected upon review.

## 2014-08-14 ENCOUNTER — Ambulatory Visit: Payer: Medicare Other | Admitting: Pulmonary Disease

## 2014-08-15 ENCOUNTER — Telehealth: Payer: Self-pay | Admitting: Internal Medicine

## 2014-08-15 NOTE — Telephone Encounter (Signed)
returned pt call and sw. pt and  r/s lab per pt request....pt ok adn aware

## 2014-09-15 ENCOUNTER — Telehealth: Payer: Self-pay | Admitting: Cardiology

## 2014-09-15 NOTE — Telephone Encounter (Signed)
Closed encounter °

## 2014-09-19 DIAGNOSIS — H401233 Low-tension glaucoma, bilateral, severe stage: Secondary | ICD-10-CM | POA: Diagnosis not present

## 2014-09-19 DIAGNOSIS — H2511 Age-related nuclear cataract, right eye: Secondary | ICD-10-CM | POA: Diagnosis not present

## 2014-09-19 DIAGNOSIS — H43813 Vitreous degeneration, bilateral: Secondary | ICD-10-CM | POA: Diagnosis not present

## 2014-09-19 DIAGNOSIS — Z961 Presence of intraocular lens: Secondary | ICD-10-CM | POA: Diagnosis not present

## 2014-09-22 ENCOUNTER — Encounter: Payer: Self-pay | Admitting: Pulmonary Disease

## 2014-09-22 ENCOUNTER — Ambulatory Visit (INDEPENDENT_AMBULATORY_CARE_PROVIDER_SITE_OTHER): Payer: Medicare Other | Admitting: Pulmonary Disease

## 2014-09-22 VITALS — BP 164/92 | HR 65 | Temp 97.1°F | Ht 65.0 in | Wt 121.2 lb

## 2014-09-22 DIAGNOSIS — R053 Chronic cough: Secondary | ICD-10-CM

## 2014-09-22 DIAGNOSIS — R05 Cough: Secondary | ICD-10-CM | POA: Diagnosis not present

## 2014-09-22 MED ORDER — PREDNISONE 5 MG PO TABS
ORAL_TABLET | ORAL | Status: DC
Start: 2014-09-22 — End: 2014-12-26

## 2014-09-22 NOTE — Patient Instructions (Signed)
Prednisone 5 mg tabs  Take 4 tabs daily with food x 1 week, then 2 tabs daily with food x 1 week , then 1 tab daily with food x 2 weeks then STOP Take DELSYM 2 tsp twice daily as needed for cough

## 2014-09-22 NOTE — Progress Notes (Signed)
   Subjective:    Patient ID: Michelle Levine, female    DOB: December 14, 1921, 80 y.o.   MRN: [id number]  HPI  80 year old never smoker presents for FU of chronic cough since 03/2014. She was diagnosed in 4/15 with non-small cell lung cancer, adenocarcinoma. She presented with multiple pulmonary nodules but at least a stage IA (T1a, N0, M0) dominant 1.8 nodule in the right upper lobe, with other smaller bilateral pulmonary nodule suspicious for metastatic disease or synchronous primaries. The other subcentimeter nodules and a precarinal lymph node were negative on PET.  Foundation One markers were positive for MET mutation, but negative for EGFR or ALK. She underwent stereotactic radiotherapy to the right upper lobe lung nodule in 04/2014. She developed this cough, productive of yellow-green sputum, she denies URI symptoms at onset, or postnasal drip or reflux symptoms. The cough preceded radiation therapy. She was given an antibiotic initially. Tessalon Perles have not provided any relief.  I do note that she is on furosemide for valvular regurgitation. Echo in 9/14 showed moderate MR, moderate TR with RVSP of 47.    09/22/2014  Chief Complaint  Patient presents with  . Acute Visit    coughing up a lot of thick, yellow mucus; no fever   CT chest 07/2014 New radiation change within the right upper lobe identified. >> zpak/ mucinex provided relief last visit 06/2014 but cough has recurred  This time Zpak (perini ) did not help Denies wheeze, sinus drip or heartburn No problems walking & is very functional    Review of Systems 'my memory is shot' neg for any significant sore throat, dysphagia, itching, sneezing, nasal congestion or excess/ purulent secretions, fever, chills, sweats, unintended wt loss, pleuritic or exertional cp, hempoptysis, orthopnea pnd or change in chronic leg swelling. Also denies presyncope, palpitations, heartburn, abdominal pain, nausea, vomiting, diarrhea or change in  bowel or urinary habits, dysuria,hematuria, rash, arthralgias, visual complaints, headache, numbness weakness or ataxia.     Objective:   Physical Exam  Gen. Pleasant, well-nourished, in no distress ENT - no lesions, no post nasal drip Neck: No JVD, no thyromegaly, no carotid bruits Lungs: no use of accessory muscles, no dullness to percussion, clear without rales or rhonchi  Cardiovascular: Rhythm regular, heart sounds  normal, no murmurs or gallops, no peripheral edema Musculoskeletal: No deformities, no cyanosis or clubbing        Assessment & Plan:

## 2014-09-22 NOTE — Assessment & Plan Note (Signed)
Will treat for radiation pneumonitis although her cough predates this Prednisone 5 mg tabs  Take 4 tabs daily with food x 1 week, then 2 tabs daily with food x 1 week , then 1 tab daily with food x 2 weeks then STOP Take DELSYM 2 tsp twice daily as needed for cough  If no relief after 1 month, consider intensify treatment for sinus drip

## 2014-10-22 ENCOUNTER — Ambulatory Visit: Payer: Medicare Other | Admitting: Adult Health

## 2014-11-19 DIAGNOSIS — Z008 Encounter for other general examination: Secondary | ICD-10-CM | POA: Diagnosis not present

## 2014-11-19 DIAGNOSIS — R7301 Impaired fasting glucose: Secondary | ICD-10-CM | POA: Diagnosis not present

## 2014-11-19 DIAGNOSIS — E041 Nontoxic single thyroid nodule: Secondary | ICD-10-CM | POA: Diagnosis not present

## 2014-11-19 DIAGNOSIS — I10 Essential (primary) hypertension: Secondary | ICD-10-CM | POA: Diagnosis not present

## 2014-11-19 DIAGNOSIS — M859 Disorder of bone density and structure, unspecified: Secondary | ICD-10-CM | POA: Diagnosis not present

## 2014-11-26 DIAGNOSIS — R7301 Impaired fasting glucose: Secondary | ICD-10-CM | POA: Diagnosis not present

## 2014-11-26 DIAGNOSIS — C3490 Malignant neoplasm of unspecified part of unspecified bronchus or lung: Secondary | ICD-10-CM | POA: Diagnosis not present

## 2014-11-26 DIAGNOSIS — Z1389 Encounter for screening for other disorder: Secondary | ICD-10-CM | POA: Diagnosis not present

## 2014-11-26 DIAGNOSIS — Z6820 Body mass index (BMI) 20.0-20.9, adult: Secondary | ICD-10-CM | POA: Diagnosis not present

## 2014-11-26 DIAGNOSIS — E0590 Thyrotoxicosis, unspecified without thyrotoxic crisis or storm: Secondary | ICD-10-CM | POA: Diagnosis not present

## 2014-11-26 DIAGNOSIS — F329 Major depressive disorder, single episode, unspecified: Secondary | ICD-10-CM | POA: Diagnosis not present

## 2014-11-26 DIAGNOSIS — J449 Chronic obstructive pulmonary disease, unspecified: Secondary | ICD-10-CM | POA: Diagnosis not present

## 2014-11-26 DIAGNOSIS — I10 Essential (primary) hypertension: Secondary | ICD-10-CM | POA: Diagnosis not present

## 2014-11-26 DIAGNOSIS — R413 Other amnesia: Secondary | ICD-10-CM | POA: Diagnosis not present

## 2014-11-26 DIAGNOSIS — I341 Nonrheumatic mitral (valve) prolapse: Secondary | ICD-10-CM | POA: Diagnosis not present

## 2014-11-26 DIAGNOSIS — Z0000 Encounter for general adult medical examination without abnormal findings: Secondary | ICD-10-CM | POA: Diagnosis not present

## 2014-11-26 DIAGNOSIS — I509 Heart failure, unspecified: Secondary | ICD-10-CM | POA: Diagnosis not present

## 2014-12-01 ENCOUNTER — Ambulatory Visit: Payer: Medicare Other | Admitting: Cardiology

## 2014-12-11 DIAGNOSIS — Z1212 Encounter for screening for malignant neoplasm of rectum: Secondary | ICD-10-CM | POA: Diagnosis not present

## 2014-12-24 NOTE — Progress Notes (Signed)
      HPI: FU aortic insufficiency, mitral insufficiency, and tricuspid regurgitation with moderate pulmonary hypertension. Last echocardiogram in September 2014 showed an ejection fraction of 50-55%, moderate aortic insufficiency, bileaflet mitral valve prolapse with moderate mitral regurgitation, moderate tricuspid regurgitation and severe left atrial enlargement. Patient has been diagnosed with lung cancer. Since I last saw her, the patient denies any dyspnea on exertion, orthopnea, PND, pedal edema, palpitations, syncope or chest pain.  Current Outpatient Prescriptions  Medication Sig Dispense Refill  . amLODipine (NORVASC) 5 MG tablet Take 5 mg by mouth daily.    Marland Kitchen aspirin EC 81 MG tablet Take 81 mg by mouth 3 (three) times a week. Monday, Wednesday and Friday    . Calcium Carbonate-Vitamin D (CALCIUM + D PO) Take 1 capsule by mouth daily.     . citalopram (CELEXA) 20 MG tablet Take 20 mg by mouth daily.     . fish oil-omega-3 fatty acids 1000 MG capsule Take 2 g by mouth every evening.     . furosemide (LASIX) 40 MG tablet Take 40 mg by mouth daily.     Marland Kitchen KLOR-CON M10 10 MEQ tablet Take 10 mEq by mouth daily.    . methimazole (TAPAZOLE) 10 MG tablet Take 5 mg by mouth daily.     . Multiple Vitamin (MULITIVITAMIN WITH MINERALS) TABS Take 1 tablet by mouth daily.     . nadolol (CORGARD) 40 MG tablet Take 20 mg by mouth daily.     . zoledronic acid (RECLAST) 5 MG/100ML SOLN Inject 5 mg into the vein. Once every (2) two years - last dose in 2012? (done at [facility])     No current facility-administered medications for this visit.     Past Medical History  Diagnosis Date  . Aortic insufficiency   . Mitral insufficiency   . Tricuspid regurgitation   . Pulmonary hypertension   . CHF (congestive heart failure)   . COPD (chronic obstructive pulmonary disease)   . Hypertension   . Cataracts, bilateral   . CAD (coronary artery disease)   . Thyroid disease     Past Surgical  History  Procedure Laterality Date  . Cataract extraction    . Breast lumpectomy    . Appendectomy    . Abdominal hysterectomy    . Eye surgery      History   Social History  . Marital Status: Married    Spouse Name: N/A  . Number of Children: N/A  . Years of Education: N/A   Occupational History  . retired    Social History Main Topics  . Smoking status: Never Smoker   . Smokeless tobacco: Never Used  . Alcohol Use: No  . Drug Use: No  . Sexual Activity: Not on file   Other Topics Concern  . Not on file   Social History Narrative    ROS: no fevers or chills, productive cough, hemoptysis, dysphasia, odynophagia, melena, hematochezia, dysuria, hematuria, rash, seizure activity, orthopnea, PND, pedal edema, claudication. Remaining systems are negative.  Physical Exam: Well-developed well-nourished in no acute distress.  Skin is warm and dry.  HEENT is normal.  Neck is supple.  Chest is clear to auscultation with normal expansion.  Cardiovascular exam is regular rate and rhythm. 3/6 systolic murmur Abdominal exam nontender or distended. No masses palpated. Extremities show no edema. neuro grossly intact  ECG sinus rhythm at a rate of 72. First-degree AV block. Occasional PVC. No ST changes.

## 2014-12-26 ENCOUNTER — Encounter: Payer: Self-pay | Admitting: Cardiology

## 2014-12-26 ENCOUNTER — Ambulatory Visit (INDEPENDENT_AMBULATORY_CARE_PROVIDER_SITE_OTHER): Payer: Medicare Other | Admitting: Cardiology

## 2014-12-26 DIAGNOSIS — I351 Nonrheumatic aortic (valve) insufficiency: Secondary | ICD-10-CM | POA: Diagnosis not present

## 2014-12-26 NOTE — Assessment & Plan Note (Signed)
Expand All Collapse All   Patient has moderate aortic insufficiency, moderate mitral regurgitation and moderate tricuspid regurgitation. Given age we will treat conservatively. She is clear that she would never consider valve surgery regardless. Will not repeat echocardiogram. Continue present dose of Lasix.

## 2014-12-26 NOTE — Patient Instructions (Signed)
Your physician wants you to follow-up in: ONE YEAR WITH DR CRENSHAW You will receive a reminder letter in the mail two months in advance. If you don't receive a letter, please call our office to schedule the follow-up appointment.  

## 2014-12-26 NOTE — Assessment & Plan Note (Signed)
Blood pressure controlled. Continue present medications. 

## 2015-01-02 IMAGING — CT NM PET TUM IMG RESTAG (PS) SKULL BASE T - THIGH
8 series · 25 of 25 positions shown · non-contrast
Comparison: 02/24/2014 chest CT.  PET of 10/29/2012

CLINICAL DATA: Subsequent treatment strategy for restaging of lung
cancer. Biopsy proven adenocarcinoma of the right upper lobe..

EXAM:
NUCLEAR MEDICINE PET SKULL BASE TO THIGH
TECHNIQUE: 6.8 mCi F-18 FDG was injected intravenously. Full-ring PET imaging
was performed from the skull base to thigh after the radiotracer. CT
data was obtained and used for attenuation correction and anatomic
localization.
FASTING BLOOD GLUCOSE:  Value: 106 mg/dl

[Series 3: pet sk_thigh ac · axial · 5.0mm · 4.07mm/px · z∈[-986,-158]mm · 4 of 208 slices shown]
[im 1/208]
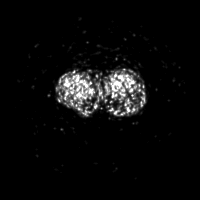
[im 70/208]
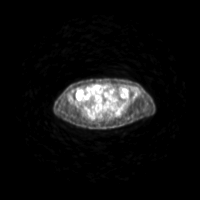
[im 139/208]
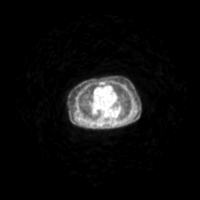
[im 208/208]
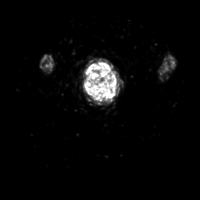

[Series 4: ct sk_thigh 5.0 b31f · axial · 5.0mm · 0.92mm/px · z∈[-986,-158]mm · 5 of 208 slices shown]
[im 1/208]
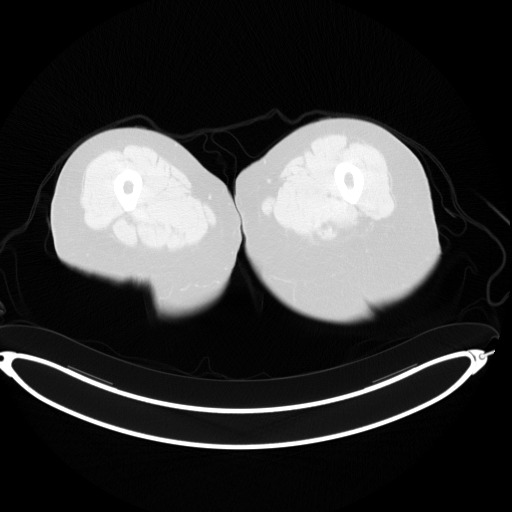
[im 52/208]
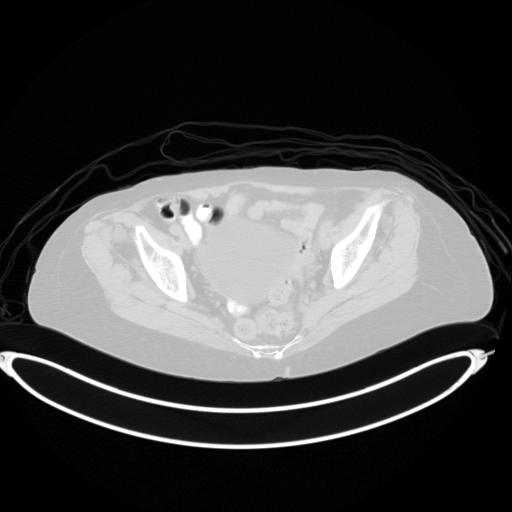
[im 104/208]
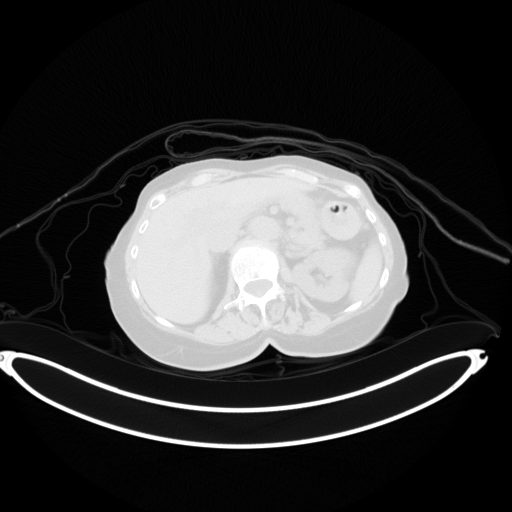
[im 156/208]
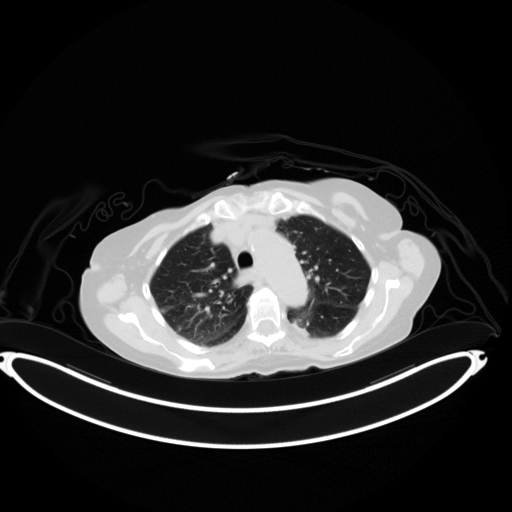
[im 208/208  brain]
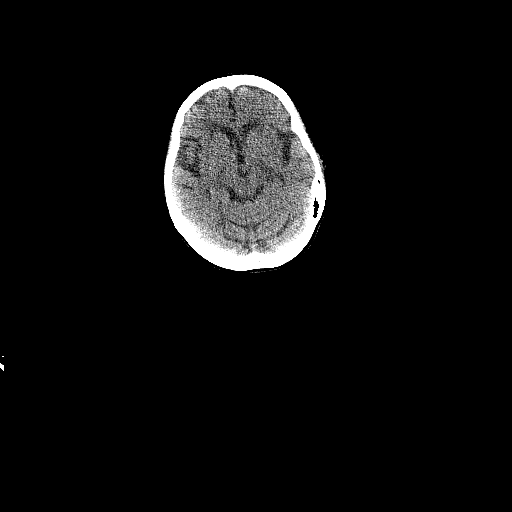

[Series 6: ct sk_thigh 5.0 b70f (id)_bone · axial · 5.0mm · 0.54mm/px · z∈[-564,-284]mm · 2 of 71 slices shown]
[im 1/71  bone]
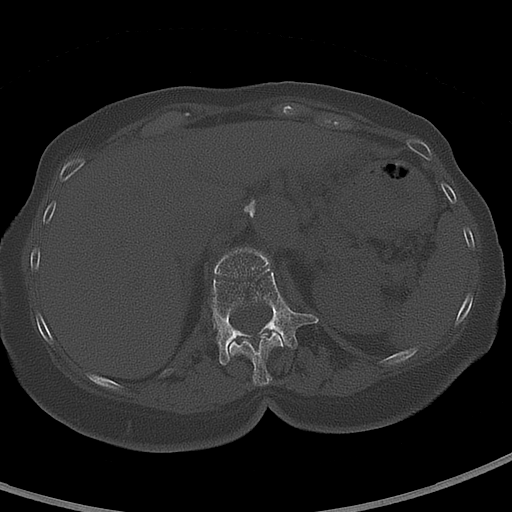
[im 71/71  bone]
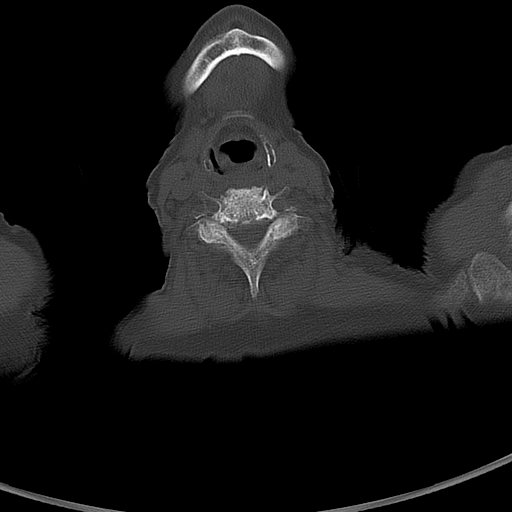

[Series 8: pet sk_thigh nac · axial · 5.0mm · 4.07mm/px · z∈[-986,-158]mm · 5 of 208 slices shown]
[im 1/208]
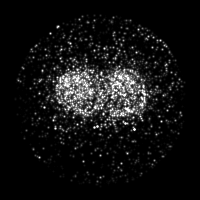
[im 52/208]
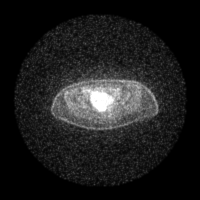
[im 104/208]
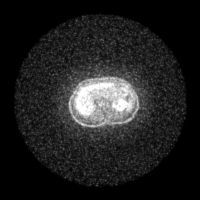
[im 156/208]
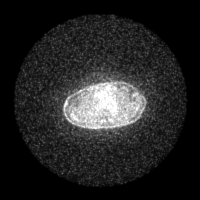
[im 208/208]
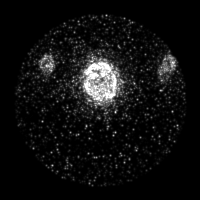

[Series 604: mip collection<mip range> · coronal · 1.72mm/px · 1 of 32 slices shown]
[im 1/32]
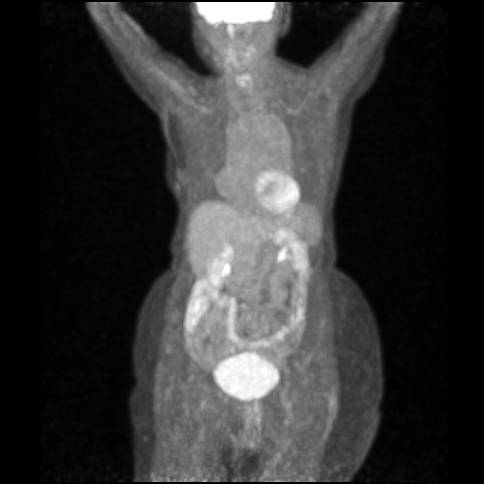

[Series 605: range-ct sk_thigh 5.0 (id)<alpha range> · 2 of 84 slices shown (1 of 2)]
[im 1/84]
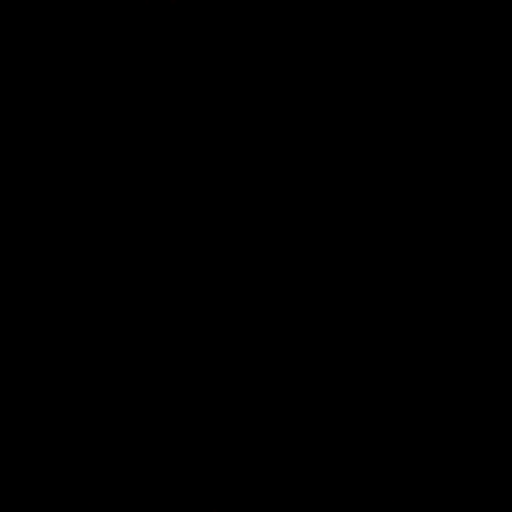
[im 84/84]
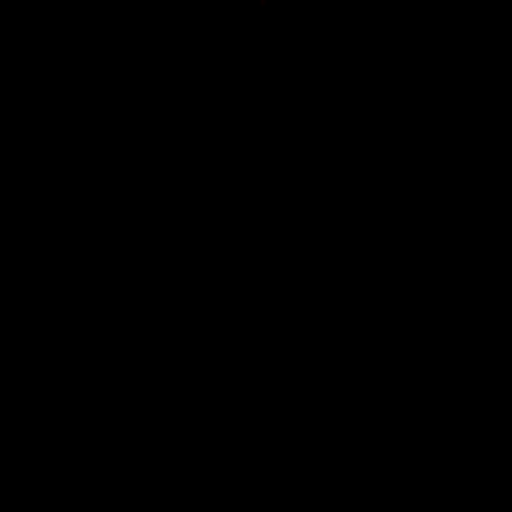

[Series 606: range-ct sk_thigh 5.0 (id)<alpha range> · 5 of 200 slices shown (2 of 2)]
[im 1/200]
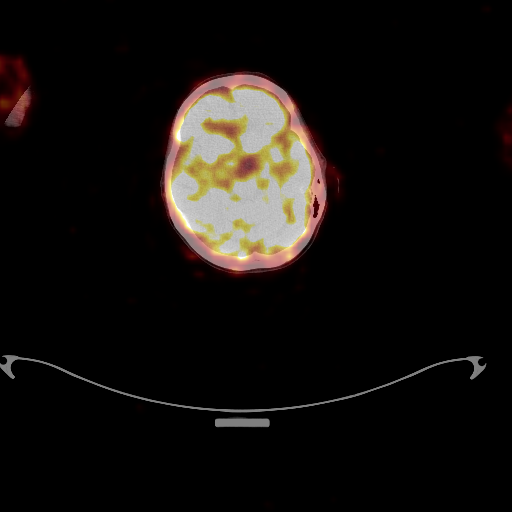
[im 50/200]
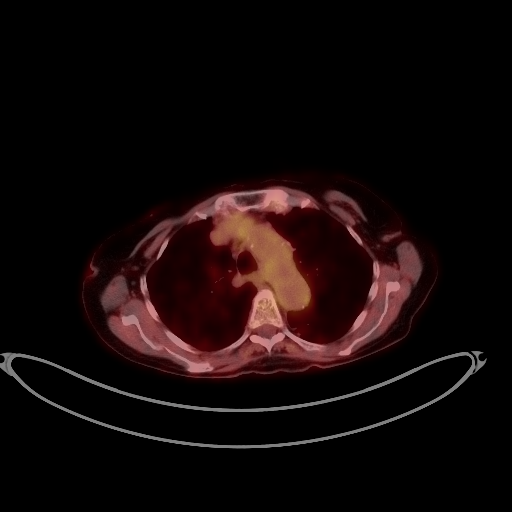
[im 100/200]
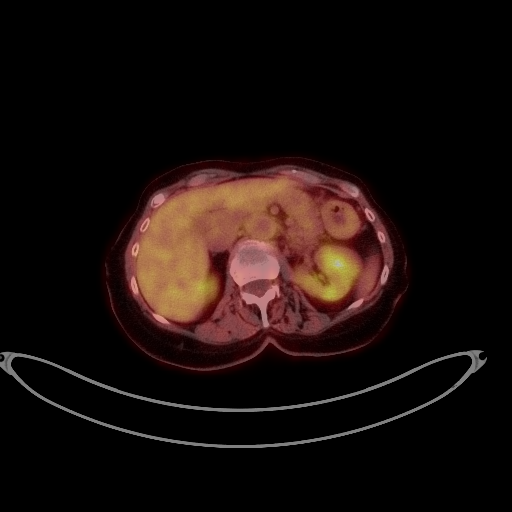
[im 150/200]
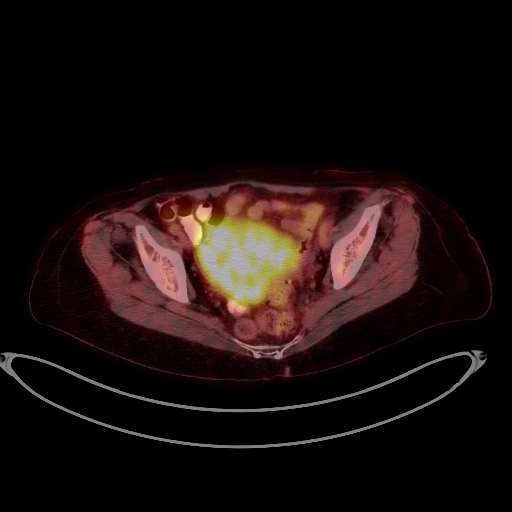
[im 200/200]
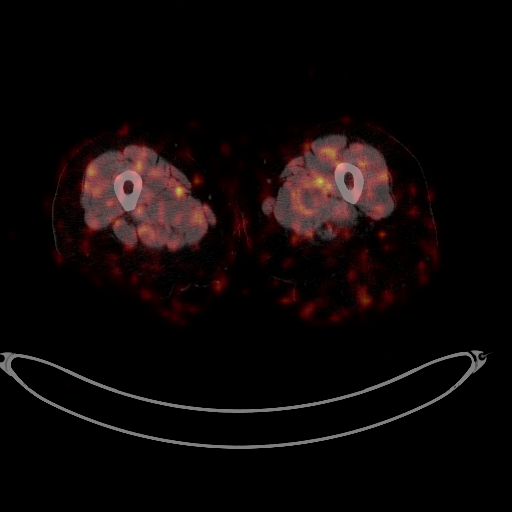

[Series 1082: results mm oncology reading · 0.94mm/px · 1 of 2 slices shown]
[im 1/2]
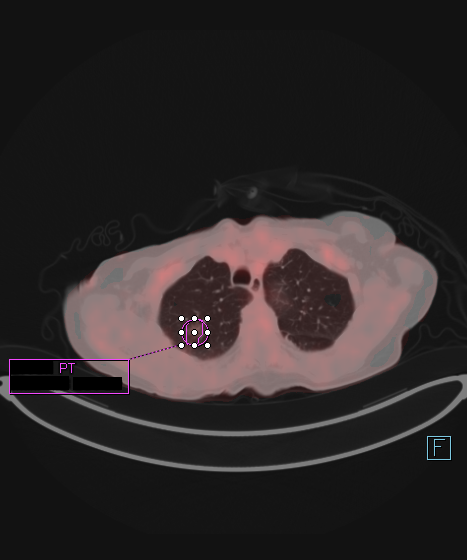

[25 of 25 positions shown; findings below may reference images not displayed]

FINDINGS: NECK

No areas of abnormal hypermetabolism.

CHEST

Mixed ground-glass and soft tissue attenuation nodule within the
right upper lobe. This measures 1.8 cm on image 18. Not
significantly hypermetabolic, measuring a S.U.V. max of 1.2.

No abnormal thoracic nodal activity.

Mild hypermetabolism corresponding to similar (since 10/29/2012)
interstitial thickening and nodularity involving the right middle
lobe. Likely post infectious or inflammatory.

ABDOMEN/PELVIS

No areas of abnormal hypermetabolism.

SKELETON

No abnormal marrow activity.

CT IMAGES PERFORMED FOR ATTENUATION CORRECTION

Expected cerebral and cerebellar atrophy. Mild thyromegaly with
multiple hypo attenuating nodules within.

Tortuous thoracic aorta. Moderate cardiomegaly with coronary artery
atherosclerosis.

12 mm precarinal node is similar back to 10/29/2012, and not
hypermetabolic. Favored to be reactive.

Bibasilar scarring. A moderate posterior gastric diverticulum.
Cholelithiasis. Hysterectomy. Curvilinear calcifications about the
expected location of the urethra. Example image 175/series 4.
Present on 10/29/2012.

Hyperattenuation in the right side of the labia. Example image 182.
This is also chronic.
IMPRESSION: 1. Low-level, non malignant range hypermetabolism corresponding to
the right upper lobe sub solid pulmonary nodule. This is consistent
with the clinical history of low-grade adenocarcinoma. No evidence
of thoracic nodal or extrathoracic hypermetabolic metastasis. Of
note, given absence of significant FDG uptake within the primary
reason, PET would be of decreased sensitivity.
2. Stable mediastinal adenopathy, likely reactive.
3. Cholelithiasis.
4. Calcifications about the urethra. These are chronic. Urethral
diverticula with complicating stones cannot be excluded. If this is
a clinical concern, the imaging test of choice is high-resolution
pelvic MRI.
5. Probable complex right-sided Bartholin's gland cyst.

## 2015-01-08 ENCOUNTER — Telehealth: Payer: Self-pay | Admitting: Medical Oncology

## 2015-01-08 ENCOUNTER — Other Ambulatory Visit: Payer: Self-pay | Admitting: Medical Oncology

## 2015-01-08 NOTE — Telephone Encounter (Signed)
Pt has 2 MRN's this is a duplicate do not use .Marland Kitchen... Please use MRN [id number].... KJ

## 2015-02-09 ENCOUNTER — Other Ambulatory Visit (HOSPITAL_BASED_OUTPATIENT_CLINIC_OR_DEPARTMENT_OTHER): Payer: Medicare Other

## 2015-02-09 ENCOUNTER — Ambulatory Visit (HOSPITAL_COMMUNITY): Payer: Medicare Other

## 2015-02-09 ENCOUNTER — Other Ambulatory Visit: Payer: Medicare Other

## 2015-02-09 ENCOUNTER — Ambulatory Visit (HOSPITAL_COMMUNITY)
Admission: RE | Admit: 2015-02-09 | Discharge: 2015-02-09 | Disposition: A | Discharge status: Home / Self Care | Payer: Medicare Other | Source: Ambulatory Visit | Attending: Internal Medicine | Admitting: Internal Medicine

## 2015-02-09 DIAGNOSIS — Z923 Personal history of irradiation: Secondary | ICD-10-CM | POA: Diagnosis not present

## 2015-02-09 DIAGNOSIS — C3411 Malignant neoplasm of upper lobe, right bronchus or lung: Secondary | ICD-10-CM

## 2015-02-09 DIAGNOSIS — C3491 Malignant neoplasm of unspecified part of right bronchus or lung: Secondary | ICD-10-CM

## 2015-02-09 DIAGNOSIS — C3490 Malignant neoplasm of unspecified part of unspecified bronchus or lung: Secondary | ICD-10-CM | POA: Diagnosis not present

## 2015-02-09 LAB — COMPREHENSIVE METABOLIC PANEL (CC13)
ALK PHOS: 78 U/L (ref 40–150)
ALT: 16 U/L (ref 0–55)
AST: 23 U/L (ref 5–34)
Albumin: 3.4 g/dL — ABNORMAL LOW (ref 3.5–5.0)
Anion Gap: 9 mEq/L (ref 3–11)
BUN: 26.7 mg/dL — AB (ref 7.0–26.0)
CO2: 29 mEq/L (ref 22–29)
CREATININE: 0.8 mg/dL (ref 0.6–1.1)
Calcium: 9.7 mg/dL (ref 8.4–10.4)
Chloride: 104 mEq/L (ref 98–109)
EGFR: 65 mL/min/{1.73_m2} — AB (ref >90)
GLUCOSE: 98 mg/dL (ref 70–140)
Potassium: 4.3 mEq/L (ref 3.5–5.1)
Sodium: 142 mEq/L (ref 136–145)
Total Bilirubin: 0.32 mg/dL (ref 0.20–1.20)
Total Protein: 7.4 g/dL (ref 6.4–8.3)

## 2015-02-09 LAB — CBC WITH DIFFERENTIAL/PLATELET
BASO%: 1.6 % (ref 0.0–2.0)
Basophils Absolute: 0.1 10*3/uL (ref 0.0–0.1)
EOS%: 4.3 % (ref 0.0–7.0)
Eosinophils Absolute: 0.4 10*3/uL (ref 0.0–0.5)
HCT: 41.7 % (ref 34.8–46.6)
HGB: 13.6 g/dL (ref 11.6–15.9)
LYMPH%: 25.4 % (ref 14.0–49.7)
MCH: 29.4 pg (ref 25.1–34.0)
MCHC: 32.6 g/dL (ref 31.5–36.0)
MCV: 90.2 fL (ref 79.5–101.0)
MONO#: 0.8 10*3/uL (ref 0.1–0.9)
MONO%: 9.1 % (ref 0.0–14.0)
NEUT%: 59.6 % (ref 38.4–76.8)
NEUTROS ABS: 5.1 10*3/uL (ref 1.5–6.5)
PLATELETS: 246 10*3/uL (ref 145–400)
RBC: 4.62 10*6/uL (ref 3.70–5.45)
RDW: 13.4 % (ref 11.2–14.5)
WBC: 8.5 10*3/uL (ref 3.9–10.3)
lymph#: 2.2 10*3/uL (ref 0.9–3.3)

## 2015-02-09 MED ORDER — IOHEXOL 300 MG/ML  SOLN
80.0000 mL | Freq: Once | INTRAMUSCULAR | Status: AC | PRN
  Administered 2015-02-09: 80 mL via INTRAVENOUS

## 2015-02-10 ENCOUNTER — Other Ambulatory Visit: Payer: Medicare Other

## 2015-02-10 ENCOUNTER — Other Ambulatory Visit (HOSPITAL_COMMUNITY): Payer: Medicare Other

## 2015-02-11 ENCOUNTER — Encounter: Payer: Self-pay | Admitting: Internal Medicine

## 2015-02-11 ENCOUNTER — Ambulatory Visit (HOSPITAL_BASED_OUTPATIENT_CLINIC_OR_DEPARTMENT_OTHER): Payer: Medicare Other | Admitting: Internal Medicine

## 2015-02-11 ENCOUNTER — Telehealth: Payer: Self-pay | Admitting: Internal Medicine

## 2015-02-11 VITALS — BP 146/61 | HR 66 | Temp 98.6°F | Resp 18 | Ht 65.0 in | Wt 124.5 lb

## 2015-02-11 DIAGNOSIS — C3411 Malignant neoplasm of upper lobe, right bronchus or lung: Secondary | ICD-10-CM

## 2015-02-11 DIAGNOSIS — C3491 Malignant neoplasm of unspecified part of right bronchus or lung: Secondary | ICD-10-CM

## 2015-02-11 NOTE — Telephone Encounter (Signed)
lvm for pt regarding to OCT appt ...mailed pt appt sched/avs and letter °

## 2015-02-11 NOTE — Progress Notes (Signed)
[place] [phone]   [phone]  OFFICE PROGRESS NOTE  Jerlyn Ly, MD [place] [address]  DIAGNOSIS: non-small cell lung cancer, adenocarcinoma, with positive MET mutation, with multiple pulmonary nodules but at least a stage IA (T1a, N0, M0) involving the right upper lobe, with other bilateral pulmonary nodule suspicious for metastatic disease or synchronous primaries.  MOLECULAR STUDIES: Foundation One: Positive for MET exon 14 splice site (0175+1W>C, TET2 Q 1524*. Negative for EGFR, ALK, RET, BRAF, KRAS, ERBB2.  PRIOR THERAPY: Stereotactic radiotherapy to the right upper lobe lung nodule expected to be completed on 05/06/2014  CURRENT THERAPY: None.  INTERVAL HISTORY: Michelle Levine 81 y.o. female returns to the clinic today for followup visit.  The patient is feeling fine today with no specific complaints except for mild cough. She has no chest pain, shortness of breath or hemoptysis. She has no nausea or vomiting, no fever or chills. She has no significant weight loss or night sweats. She had repeat CT scan of the chest performed recently and she is here for evaluation and discussion of her scan results.  MEDICAL HISTORY: Past Medical History  Diagnosis Date  . Aortic insufficiency   . Mitral insufficiency   . Tricuspid regurgitation   . Pulmonary hypertension   . CHF (congestive heart failure)   . COPD (chronic obstructive pulmonary disease)   . Hypertension   . Cataracts, bilateral   . CAD (coronary artery disease)   . Thyroid disease     ALLERGIES:  has No Known Allergies.  MEDICATIONS:  Current Outpatient Prescriptions  Medication Sig Dispense Refill  . amLODipine (NORVASC) 5 MG tablet Take 5 mg by mouth daily.    Marland Kitchen aspirin EC 81 MG tablet Take 81 mg by mouth 3 (three) times a week. Monday, Wednesday and Friday    . Calcium Carbonate-Vitamin D (CALCIUM + D PO) Take 1 capsule by mouth daily.       . citalopram (CELEXA) 20 MG tablet Take 20 mg by mouth daily.     . fish oil-omega-3 fatty acids 1000 MG capsule Take 2 g by mouth every evening.     . furosemide (LASIX) 40 MG tablet Take 40 mg by mouth daily.     Marland Kitchen KLOR-CON M10 10 MEQ tablet Take 10 mEq by mouth daily.    . methimazole (TAPAZOLE) 10 MG tablet Take 5 mg by mouth daily.     . Multiple Vitamin (MULITIVITAMIN WITH MINERALS) TABS Take 1 tablet by mouth daily.     . nadolol (CORGARD) 40 MG tablet Take 20 mg by mouth daily.     . zoledronic acid (RECLAST) 5 MG/100ML SOLN Inject 5 mg into the vein. Once every (2) two years - last dose in 2012? (done at [facility])    . amoxicillin (AMOXIL) 500 MG capsule   99   No current facility-administered medications for this visit.    SURGICAL HISTORY:  Past Surgical History  Procedure Laterality Date  . Cataract extraction    . Breast lumpectomy    . Appendectomy    . Abdominal hysterectomy    . Eye surgery      REVIEW OF SYSTEMS:  A comprehensive review of systems was negative except for: Respiratory: positive for cough   PHYSICAL EXAMINATION: General appearance: alert, cooperative and no distress Head: Normocephalic, without obvious abnormality, atraumatic Neck: no adenopathy, no JVD, supple, symmetrical, trachea midline and thyroid not enlarged, symmetric, no  tenderness/mass/nodules Lymph nodes: Cervical, supraclavicular, and axillary nodes normal. Resp: clear to auscultation bilaterally Back: symmetric, no curvature. ROM normal. No CVA tenderness. Cardio: regular rate and rhythm, S1, S2 normal, no murmur, click, rub or gallop GI: soft, non-tender; bowel sounds normal; no masses,  no organomegaly Extremities: extremities normal, atraumatic, no cyanosis or edema Neurologic: Alert and oriented X 3, normal strength and tone. Normal symmetric reflexes. Normal coordination and gait  ECOG PERFORMANCE STATUS: 1 - Symptomatic but completely ambulatory  Blood pressure  146/61, pulse 66, temperature 98.6 F (37 C), temperature source Oral, resp. rate 18, height _0  (1.651 m), weight 124 lb 8 oz (56.473 kg), SpO2 99 %.  LABORATORY DATA: Lab Results  Component Value Date   WBC 8.5 02/09/2015   HGB 13.6 02/09/2015   HCT 41.7 02/09/2015   MCV 90.2 02/09/2015   PLT 246 02/09/2015      Chemistry      Component Value Date/Time   NA 142 02/09/2015 1350   NA 138 12/06/2013 1440   K 4.3 02/09/2015 1350   K 3.6 12/06/2013 1440   CL 105 12/06/2013 1440   CO2 29 02/09/2015 1350   CO2 27 12/06/2013 1440   BUN 26.7* 02/09/2015 1350   BUN 20 12/06/2013 1440   CREATININE 0.8 02/09/2015 1350   CREATININE 0.8 12/06/2013 1440      Component Value Date/Time   CALCIUM 9.7 02/09/2015 1350   CALCIUM 8.9 12/06/2013 1440   ALKPHOS 78 02/09/2015 1350   ALKPHOS 76 03/22/2010 0450   AST 23 02/09/2015 1350   AST 23 03/22/2010 0450   ALT 16 02/09/2015 1350   ALT 29 03/22/2010 0450   BILITOT 0.32 02/09/2015 1350   BILITOT 0.6 03/22/2010 0450       RADIOGRAPHIC STUDIES: Ct Chest W Contrast  02/09/2015   CLINICAL DATA:  Followup lung cancer staging with radiation progress.  EXAM: CT CHEST WITH CONTRAST  TECHNIQUE: Multidetector CT imaging of the chest was performed during intravenous contrast administration.  CONTRAST:  55m OMNIPAQUE IOHEXOL 300 MG/ML  SOLN  COMPARISON:  CT 07/27/2014  FINDINGS: Mediastinum/Nodes: No axillary or supraclavicular lymphadenopathy. Nodular enlargement of the thyroid gland is stable. Precarinal lymph node measures 13 mm slightly increased from 9 mm on prior. No central pulmonary embolism. Esophagus normal.  Lungs/Pleura: There is a band of consolidation air bronchograms in the right upper lobe not changed from prior consistent with radiation change. Right lower lobe nodule measuring 5 mm on image 30, series 5 is unchanged. Nodular thickening along the inferior aspect of the oblique fissure is stable on image 34, series 5  Upper abdomen:  Limited view of the liver, kidneys, pancreas are unremarkable. Normal adrenal glands.  Musculoskeletal: No aggressive osseous lesion.  IMPRESSION: 1. Stable posttherapy change in the right upper lobe. 2. Stable small right  pulmonary nodules. 3. No evidence lung cancer recurrence. 4. Small precarinal lymph node is slightly more prominent but likely reactive.   Electronically Signed   By: SSuzy BouchardM.D.   On: 02/09/2015 16:34   ASSESSMENT AND PLAN: This is a very pleasant 81years old white female recently diagnosed with metastatic non-small cell lung cancer, adenocarcinoma with right upper lobe lung nodule in addition to smaller bilateral pulmonary nodules and positive MET mutation.  She completed a stereotactic radiotherapy to the right upper lobe pulmonary nodule under the care of Dr. WPablo Ledger  The patient is doing fine today with no specific complaints except for mild cough. Her recent CT  scan of the chest showed no evidence for disease progression. I discussed the scan results with the patient. I recommended for her to continue on observation with repeat CT scan of the chest in 6 months. She was advised to call immediately if she has any concerning symptoms in the interval.  The patient voices understanding of current disease status and treatment options and is in agreement with the current care plan.  All questions were answered. The patient knows to call the clinic with any problems, questions or concerns. We can certainly see the patient much sooner if necessary.  Disclaimer: This note was dictated with voice recognition software. Similar sounding words can inadvertently be transcribed and may not be corrected upon review.

## 2015-03-20 DIAGNOSIS — H2511 Age-related nuclear cataract, right eye: Secondary | ICD-10-CM | POA: Diagnosis not present

## 2015-03-20 DIAGNOSIS — H401233 Low-tension glaucoma, bilateral, severe stage: Secondary | ICD-10-CM | POA: Diagnosis not present

## 2015-03-26 DIAGNOSIS — Z1231 Encounter for screening mammogram for malignant neoplasm of breast: Secondary | ICD-10-CM | POA: Diagnosis not present

## 2015-04-10 DIAGNOSIS — M545 Low back pain: Secondary | ICD-10-CM | POA: Diagnosis not present

## 2015-04-10 DIAGNOSIS — Z6821 Body mass index (BMI) 21.0-21.9, adult: Secondary | ICD-10-CM | POA: Diagnosis not present

## 2015-04-30 DIAGNOSIS — M7070 Other bursitis of hip, unspecified hip: Secondary | ICD-10-CM | POA: Diagnosis not present

## 2015-04-30 DIAGNOSIS — R918 Other nonspecific abnormal finding of lung field: Secondary | ICD-10-CM | POA: Diagnosis not present

## 2015-04-30 DIAGNOSIS — I10 Essential (primary) hypertension: Secondary | ICD-10-CM | POA: Diagnosis not present

## 2015-04-30 DIAGNOSIS — R7301 Impaired fasting glucose: Secondary | ICD-10-CM | POA: Diagnosis not present

## 2015-04-30 DIAGNOSIS — I509 Heart failure, unspecified: Secondary | ICD-10-CM | POA: Diagnosis not present

## 2015-04-30 DIAGNOSIS — Z6821 Body mass index (BMI) 21.0-21.9, adult: Secondary | ICD-10-CM | POA: Diagnosis not present

## 2015-04-30 DIAGNOSIS — Z1389 Encounter for screening for other disorder: Secondary | ICD-10-CM | POA: Diagnosis not present

## 2015-05-06 DIAGNOSIS — M7071 Other bursitis of hip, right hip: Secondary | ICD-10-CM | POA: Diagnosis not present

## 2015-05-12 DIAGNOSIS — M7071 Other bursitis of hip, right hip: Secondary | ICD-10-CM | POA: Diagnosis not present

## 2015-05-14 DIAGNOSIS — M7071 Other bursitis of hip, right hip: Secondary | ICD-10-CM | POA: Diagnosis not present

## 2015-05-25 DIAGNOSIS — M7071 Other bursitis of hip, right hip: Secondary | ICD-10-CM | POA: Diagnosis not present

## 2015-05-28 DIAGNOSIS — M7071 Other bursitis of hip, right hip: Secondary | ICD-10-CM | POA: Diagnosis not present

## 2015-06-10 DIAGNOSIS — M7071 Other bursitis of hip, right hip: Secondary | ICD-10-CM | POA: Diagnosis not present

## 2015-07-01 DIAGNOSIS — M545 Low back pain: Secondary | ICD-10-CM | POA: Diagnosis not present

## 2015-07-10 DIAGNOSIS — M545 Low back pain: Secondary | ICD-10-CM | POA: Diagnosis not present

## 2015-07-16 DIAGNOSIS — M545 Low back pain: Secondary | ICD-10-CM | POA: Diagnosis not present

## 2015-07-22 DIAGNOSIS — M545 Low back pain: Secondary | ICD-10-CM | POA: Diagnosis not present

## 2015-07-23 DIAGNOSIS — M5136 Other intervertebral disc degeneration, lumbar region: Secondary | ICD-10-CM | POA: Diagnosis not present

## 2015-07-23 DIAGNOSIS — M545 Low back pain: Secondary | ICD-10-CM | POA: Diagnosis not present

## 2015-07-23 DIAGNOSIS — M4310 Spondylolisthesis, site unspecified: Secondary | ICD-10-CM | POA: Diagnosis not present

## 2015-07-24 DIAGNOSIS — M545 Low back pain: Secondary | ICD-10-CM | POA: Diagnosis not present

## 2015-07-28 DIAGNOSIS — M545 Low back pain: Secondary | ICD-10-CM | POA: Diagnosis not present

## 2015-07-31 DIAGNOSIS — M545 Low back pain: Secondary | ICD-10-CM | POA: Diagnosis not present

## 2015-08-03 DIAGNOSIS — M545 Low back pain: Secondary | ICD-10-CM | POA: Diagnosis not present

## 2015-08-07 DIAGNOSIS — M545 Low back pain: Secondary | ICD-10-CM | POA: Diagnosis not present

## 2015-08-11 ENCOUNTER — Other Ambulatory Visit: Payer: Medicare Other

## 2015-08-12 ENCOUNTER — Ambulatory Visit (HOSPITAL_COMMUNITY)
Admission: RE | Admit: 2015-08-12 | Discharge: 2015-08-12 | Disposition: A | Discharge status: Home / Self Care | Payer: Medicare Other | Source: Ambulatory Visit | Attending: Internal Medicine | Admitting: Internal Medicine

## 2015-08-12 ENCOUNTER — Other Ambulatory Visit (HOSPITAL_BASED_OUTPATIENT_CLINIC_OR_DEPARTMENT_OTHER): Payer: Medicare Other

## 2015-08-12 ENCOUNTER — Encounter (HOSPITAL_COMMUNITY): Payer: Self-pay

## 2015-08-12 ENCOUNTER — Other Ambulatory Visit: Payer: Self-pay | Admitting: Medical Oncology

## 2015-08-12 DIAGNOSIS — R918 Other nonspecific abnormal finding of lung field: Secondary | ICD-10-CM | POA: Diagnosis not present

## 2015-08-12 DIAGNOSIS — C3491 Malignant neoplasm of unspecified part of right bronchus or lung: Secondary | ICD-10-CM

## 2015-08-12 DIAGNOSIS — E042 Nontoxic multinodular goiter: Secondary | ICD-10-CM | POA: Insufficient documentation

## 2015-08-12 DIAGNOSIS — J8410 Pulmonary fibrosis, unspecified: Secondary | ICD-10-CM | POA: Insufficient documentation

## 2015-08-12 DIAGNOSIS — C3411 Malignant neoplasm of upper lobe, right bronchus or lung: Secondary | ICD-10-CM | POA: Diagnosis present

## 2015-08-12 DIAGNOSIS — J9811 Atelectasis: Secondary | ICD-10-CM | POA: Diagnosis not present

## 2015-08-12 LAB — COMPREHENSIVE METABOLIC PANEL (CC13)
ALT: 24 U/L (ref 0–55)
AST: 27 U/L (ref 5–34)
Albumin: 3.6 g/dL (ref 3.5–5.0)
Alkaline Phosphatase: 71 U/L (ref 40–150)
Anion Gap: 8 mEq/L (ref 3–11)
BUN: 26 mg/dL (ref 7.0–26.0)
CALCIUM: 9.4 mg/dL (ref 8.4–10.4)
CO2: 27 meq/L (ref 22–29)
Chloride: 107 mEq/L (ref 98–109)
Creatinine: 0.8 mg/dL (ref 0.6–1.1)
EGFR: 61 mL/min/{1.73_m2} — ABNORMAL LOW (ref >90)
Glucose: 105 mg/dl (ref 70–140)
Potassium: 4 mEq/L (ref 3.5–5.1)
Sodium: 141 mEq/L (ref 136–145)
Total Bilirubin: 0.49 mg/dL (ref 0.20–1.20)
Total Protein: 7.3 g/dL (ref 6.4–8.3)

## 2015-08-12 LAB — CBC WITH DIFFERENTIAL/PLATELET
BASO%: 1.5 % (ref 0.0–2.0)
BASOS ABS: 0.1 10*3/uL (ref 0.0–0.1)
EOS%: 3.2 % (ref 0.0–7.0)
Eosinophils Absolute: 0.2 10*3/uL (ref 0.0–0.5)
HCT: 40.1 % (ref 34.8–46.6)
HEMOGLOBIN: 13.3 g/dL (ref 11.6–15.9)
LYMPH%: 24.1 % (ref 14.0–49.7)
MCH: 30.6 pg (ref 25.1–34.0)
MCHC: 33.1 g/dL (ref 31.5–36.0)
MCV: 92.3 fL (ref 79.5–101.0)
MONO#: 0.6 10*3/uL (ref 0.1–0.9)
MONO%: 7.5 % (ref 0.0–14.0)
NEUT#: 5 10*3/uL (ref 1.5–6.5)
NEUT%: 63.7 % (ref 38.4–76.8)
Platelets: 201 10*3/uL (ref 145–400)
RBC: 4.35 10*6/uL (ref 3.70–5.45)
RDW: 13.9 % (ref 11.2–14.5)
WBC: 7.8 10*3/uL (ref 3.9–10.3)
lymph#: 1.9 10*3/uL (ref 0.9–3.3)

## 2015-08-12 MED ORDER — IOHEXOL 300 MG/ML  SOLN
75.0000 mL | Freq: Once | INTRAMUSCULAR | Status: AC | PRN
  Administered 2015-08-12: 75 mL via INTRAVENOUS

## 2015-08-13 ENCOUNTER — Ambulatory Visit: Payer: Medicare Other | Admitting: Internal Medicine

## 2015-08-19 ENCOUNTER — Ambulatory Visit (HOSPITAL_BASED_OUTPATIENT_CLINIC_OR_DEPARTMENT_OTHER): Payer: Medicare Other | Admitting: Internal Medicine

## 2015-08-19 ENCOUNTER — Telehealth: Payer: Self-pay | Admitting: Internal Medicine

## 2015-08-19 ENCOUNTER — Encounter: Payer: Self-pay | Admitting: Internal Medicine

## 2015-08-19 VITALS — BP 143/70 | HR 68 | Temp 97.6°F | Resp 18 | Ht 65.0 in | Wt 123.5 lb

## 2015-08-19 DIAGNOSIS — C3491 Malignant neoplasm of unspecified part of right bronchus or lung: Secondary | ICD-10-CM

## 2015-08-19 DIAGNOSIS — C3411 Malignant neoplasm of upper lobe, right bronchus or lung: Secondary | ICD-10-CM | POA: Diagnosis not present

## 2015-08-19 NOTE — Progress Notes (Signed)
[place] [phone]   [phone]  OFFICE PROGRESS NOTE  Jerlyn Ly, MD [place] [address]  DIAGNOSIS: non-small cell lung cancer, adenocarcinoma, with positive MET mutation, with multiple pulmonary nodules but at least a stage IA (T1a, N0, M0) involving the right upper lobe, with other bilateral pulmonary nodule suspicious for metastatic disease or synchronous primaries.  MOLECULAR STUDIES: Foundation One: Positive for MET exon 14 splice site ([id number]>Q, TET2 Q 1524*. Negative for EGFR, ALK, RET, BRAF, KRAS, ERBB2.  PRIOR THERAPY: Stereotactic radiotherapy to the right upper lobe lung nodule expected to be completed on 05/06/2014  CURRENT THERAPY: None.  INTERVAL HISTORY: Michelle Levine 81 y.o. female returns to the clinic today for 6 months followup visit.  The patient is feeling fine today with no specific complaints except for mild cough in the morning. She has no chest pain, shortness of breath or hemoptysis. She has no nausea or vomiting, no fever or chills. She has no significant weight loss or night sweats. She had repeat CT scan of the chest performed recently and she is here for evaluation and discussion of her scan results.  MEDICAL HISTORY: Past Medical History  Diagnosis Date  . Aortic insufficiency   . Mitral insufficiency   . Tricuspid regurgitation   . Pulmonary hypertension ([place])   . CHF (congestive heart failure) ([place])   . COPD (chronic obstructive pulmonary disease) ([place])   . Hypertension   . Cataracts, bilateral   . CAD (coronary artery disease)   . Thyroid disease     ALLERGIES:  has No Known Allergies.  MEDICATIONS:  Current Outpatient Prescriptions  Medication Sig Dispense Refill  . amLODipine (NORVASC) 5 MG tablet Take 5 mg by mouth daily.    Marland Kitchen amoxicillin (AMOXIL) 500 MG capsule   99  . aspirin EC 81 MG tablet Take 81 mg by mouth 3 (three) times a week. Monday, Wednesday and Friday     . Calcium Carbonate-Vitamin D (CALCIUM + D PO) Take 1 capsule by mouth daily.     . citalopram (CELEXA) 20 MG tablet Take 20 mg by mouth daily.     . fish oil-omega-3 fatty acids 1000 MG capsule Take 2 g by mouth every evening.     . furosemide (LASIX) 40 MG tablet Take 40 mg by mouth daily.     Marland Kitchen KLOR-CON M10 10 MEQ tablet Take 10 mEq by mouth daily.    . methimazole (TAPAZOLE) 10 MG tablet Take 5 mg by mouth daily.     . Multiple Vitamin (MULITIVITAMIN WITH MINERALS) TABS Take 1 tablet by mouth daily.     . nadolol (CORGARD) 40 MG tablet Take 20 mg by mouth daily.     . zoledronic acid (RECLAST) 5 MG/100ML SOLN Inject 5 mg into the vein. Once every (2) two years - last dose in 2012? (done at [facility])     No current facility-administered medications for this visit.    SURGICAL HISTORY:  Past Surgical History  Procedure Laterality Date  . Cataract extraction    . Breast lumpectomy    . Appendectomy    . Abdominal hysterectomy    . Eye surgery      REVIEW OF SYSTEMS:  A comprehensive review of systems was negative except for: Respiratory: positive for cough   PHYSICAL EXAMINATION: General appearance: alert, cooperative and no distress Head: Normocephalic, without obvious abnormality, atraumatic Neck: no adenopathy, no JVD, supple, symmetrical, trachea  midline and thyroid not enlarged, symmetric, no tenderness/mass/nodules Lymph nodes: Cervical, supraclavicular, and axillary nodes normal. Resp: clear to auscultation bilaterally Back: symmetric, no curvature. ROM normal. No CVA tenderness. Cardio: regular rate and rhythm, S1, S2 normal, no murmur, click, rub or gallop GI: soft, non-tender; bowel sounds normal; no masses,  no organomegaly Extremities: extremities normal, atraumatic, no cyanosis or edema Neurologic: Alert and oriented X 3, normal strength and tone. Normal symmetric reflexes. Normal coordination and gait  ECOG PERFORMANCE STATUS: 1 - Symptomatic but  completely ambulatory  Blood pressure 143/70, pulse 68, temperature 97.6 F (36.4 C), temperature source Oral, resp. rate 18, height $RemoveBe'5\' 5"'IVSIwgKQZ$  (1.651 m), weight 123 lb 8 oz (56.019 kg), SpO2 93 %.  LABORATORY DATA: Lab Results  Component Value Date   WBC 7.8 08/12/2015   HGB 13.3 08/12/2015   HCT 40.1 08/12/2015   MCV 92.3 08/12/2015   PLT 201 08/12/2015      Chemistry      Component Value Date/Time   NA 141 08/12/2015 1059   NA 138 12/06/2013 1440   K 4.0 08/12/2015 1059   K 3.6 12/06/2013 1440   CL 105 12/06/2013 1440   CO2 27 08/12/2015 1059   CO2 27 12/06/2013 1440   BUN 26.0 08/12/2015 1059   BUN 20 12/06/2013 1440   CREATININE 0.8 08/12/2015 1059   CREATININE 0.8 12/06/2013 1440      Component Value Date/Time   CALCIUM 9.4 08/12/2015 1059   CALCIUM 8.9 12/06/2013 1440   ALKPHOS 71 08/12/2015 1059   ALKPHOS 76 03/22/2010 0450   AST 27 08/12/2015 1059   AST 23 03/22/2010 0450   ALT 24 08/12/2015 1059   ALT 29 03/22/2010 0450   BILITOT 0.49 08/12/2015 1059   BILITOT 0.6 03/22/2010 0450       RADIOGRAPHIC STUDIES: Ct Chest W Contrast  08/12/2015  CLINICAL DATA:  Lung cancer diagnosed in 2015. Radiation therapy completed. Subsequent encounter. EXAM: CT CHEST WITH CONTRAST TECHNIQUE: Multidetector CT imaging of the chest was performed during intravenous contrast administration. CONTRAST:  76mL OMNIPAQUE IOHEXOL 300 MG/ML  SOLN COMPARISON:  02/09/2015 and 07/27/2014. FINDINGS: Mediastinum/Nodes: A precarinal node measuring 12 mm on image 19 is stable. No enlarged mediastinal, hilar or axillary lymph nodes demonstrated.The thyroid gland remains enlarged by multiple low-density nodules. The esophagus appears normal. The heart is enlarged. There is no pericardial effusion. There is stable central enlargement of the pulmonary arteries. Atherosclerosis of the aorta, great vessels and coronary arteries noted. Lungs/Pleura: There is no pleural effusion. There is progressive volume  loss and wedge-shaped consolidation posteriorly in the right upper lobe, most apparent on the sagittal images. Subpleural scarring in the right middle lobe appears stable. Emphysema, central airway thickening and mild bronchiectasis are noted. There is mildly increased subpleural density in the right lower lobe on image 42, likely atelectasis or inflammation. Scattered small pulmonary nodules are grossly stable, the largest measuring 6 mm in the right lower lobe on image 29. Upper abdomen: The visualized upper abdomen appears stable without suspicious findings. There is mild cortical scarring in the upper pole of the left kidney. Musculoskeletal/Chest wall: There is no chest wall mass or suspicious osseous finding. IMPRESSION: 1. Progressive volume loss and wedge-shaped subpleural fibrosis/atelectasis posteriorly in the right upper lobe, attributed to prior radiation therapy. No central mass lesion/endobronchial lesion identified. 2. No evidence of local recurrence or metastatic disease. Prominent precarinal lymph node and small pulmonary nodules bilaterally are unchanged. 3. Probable small focus of atelectasis or inflammation  in the right lower lobe. 4. Multinodular goiter. Electronically Signed   By: Richardean Sale M.D.   On: 08/12/2015 14:55   ASSESSMENT AND PLAN: This is a very pleasant 81 years old white female recently diagnosed with metastatic non-small cell lung cancer, adenocarcinoma with right upper lobe lung nodule in addition to smaller bilateral pulmonary nodules and positive MET mutation.  She completed a stereotactic radiotherapy to the right upper lobe pulmonary nodule under the care of Dr. Pablo Ledger.  The patient has been doing fine today with no specific complaints except for mild cough. Her recent CT scan of the chest showed no evidence for disease progression. I discussed the scan results with the patient. I recommended for her to continue on observation with repeat CT scan of the chest  in 6 months. She was advised to call immediately if she has any concerning symptoms in the interval.  The patient voices understanding of current disease status and treatment options and is in agreement with the current care plan.  All questions were answered. The patient knows to call the clinic with any problems, questions or concerns. We can certainly see the patient much sooner if necessary.  Disclaimer: This note was dictated with voice recognition software. Similar sounding words can inadvertently be transcribed and may not be corrected upon review.

## 2015-08-19 NOTE — Telephone Encounter (Signed)
s.w. pt and advised on April appt.....pt ok and aware °

## 2015-08-25 DIAGNOSIS — M5136 Other intervertebral disc degeneration, lumbar region: Secondary | ICD-10-CM | POA: Diagnosis not present

## 2015-08-25 DIAGNOSIS — M545 Low back pain: Secondary | ICD-10-CM | POA: Diagnosis not present

## 2015-08-25 DIAGNOSIS — M4310 Spondylolisthesis, site unspecified: Secondary | ICD-10-CM | POA: Diagnosis not present

## 2015-09-07 ENCOUNTER — Encounter: Payer: Self-pay | Admitting: Internal Medicine

## 2015-09-07 ENCOUNTER — Ambulatory Visit (INDEPENDENT_AMBULATORY_CARE_PROVIDER_SITE_OTHER): Payer: Medicare Other | Admitting: Internal Medicine

## 2015-09-07 VITALS — BP 128/78 | HR 66 | Ht 65.0 in | Wt 125.0 lb

## 2015-09-07 DIAGNOSIS — R05 Cough: Secondary | ICD-10-CM

## 2015-09-07 DIAGNOSIS — C3491 Malignant neoplasm of unspecified part of right bronchus or lung: Secondary | ICD-10-CM

## 2015-09-07 DIAGNOSIS — R053 Chronic cough: Secondary | ICD-10-CM

## 2015-09-07 MED ORDER — PREDNISONE 5 MG PO TABS: ORAL_TABLET | ORAL | Status: DC

## 2015-09-07 NOTE — Assessment & Plan Note (Signed)
CT images show fibrosis/atelectasis and radiation therapy change with no obvious recurrence. Managed by oncology.

## 2015-09-07 NOTE — Patient Instructions (Signed)
Script sent to take prednisone 5 mg tabs: 4 daily x 7 days Then 2 daily x 7 days Then one daily x 7 days  Please check with Dr Perini's office to make sure you have had a flu shot this fall

## 2015-09-07 NOTE — Progress Notes (Signed)
09/07/15- 81 yoF followed by Dr Elsworth Soho for chronic cough and by Oncology for [facility]  Lung/ XRT Follows for: Chronic cough. Pt c/o continued mucus build up in chest/throat, yellow in color. Pt denies fever. Pt states that the medication she was given at [place] helped a lot. (prednisone 5 mg tabs Take 4 tabs daily with food x 1 week, then 2 tabs daily with food x 1 week , then 1 tab daily with food x 2 weeks ) Denies infection and denies chest pain, blood, fever or acute event. Not short of breath. She thinks she got flu vaccine at primary physician office/Dr. Joylene Draft CT reviewed with her CT chest 10/5/16IMPRESSION: 1. Progressive volume loss and wedge-shaped subpleural fibrosis/atelectasis posteriorly in the right upper lobe, attributed to prior radiation therapy. No central mass lesion/endobronchial lesion identified. 2. No evidence of local recurrence or metastatic disease. Prominent precarinal lymph node and small pulmonary nodules bilaterally are unchanged. 3. Probable small focus of atelectasis or inflammation in the right lower lobe. 4. Multinodular goiter. Electronically Signed  By: Richardean Sale M.D.  On: 08/12/2015 14:55  ROS-see HPI   Negative unless "+" Constitutional:    weight loss, night sweats, fevers, chills, fatigue, lassitude. HEENT:    headaches, difficulty swallowing, tooth/dental problems, sore throat,       sneezing, itching, ear ache, nasal congestion, post nasal drip, snoring CV:    chest pain, orthopnea, PND, swelling in lower extremities, anasarca,                                                      dizziness, palpitations Resp:   shortness of breath with exertion or at rest.                + productive cough,   non-productive cough, coughing up of blood.              change in color of mucus.  wheezing.   Skin:    rash or lesions. GI:  No-   heartburn, indigestion, abdominal pain, nausea, vomiting, diarrhea,                 change in bowel habits, loss of  appetite GU: . MS:   joint pain, stiffness, . Neuro-     nothing unusual Psych:  change in mood or affect.  depression or anxiety.   memory loss.  OBJ- Physical Exam General- Alert, Oriented, Affect-appropriate, Distress- none acute, elderly Skin- rash-none, lesions- none, excoriation- none Lymphadenopathy- none Head- atraumatic            Eyes- Gross vision intact, PERRLA, conjunctivae and secretions clear            Ears- Hearing, canals-normal            Nose- Clear, no-Septal dev, mucus, polyps, erosion, perforation             Throat- Mallampati II , mucosa clear , drainage- none, tonsils- atrophic Neck- flexible , trachea midline, no stridor , thyroid nl, carotid no bruit Chest - symmetrical excursion , unlabored           Heart/CV- RRR , no murmur , no gallop  , no rub, nl s1 s2                           -  JVD- none , edema- none, stasis changes- none, varices- none           Lung- clear to P&A, unlabored wheeze- none, cough- none , dullness-none, rub- none           Chest wall-  Abd-  Br/ Gen/ Rectal- Not done, not indicated Extrem- cyanosis- none, clubbing, none, atrophy- none, strength- nl Neuro- grossly intact to observation

## 2015-09-07 NOTE — Assessment & Plan Note (Signed)
Chronic cough component probably related to GERD. There is enough radiation associated changed now on chest x-ray that we can assume a chronic bronchitis component. We will treat her as she asks and as she remembers helpful Plan-slow prednisone taper

## 2015-09-07 NOTE — Assessment & Plan Note (Signed)
Chronic bronchitis component. I am not going to treat for active infection at this time.

## 2015-09-12 DIAGNOSIS — Z23 Encounter for immunization: Secondary | ICD-10-CM | POA: Diagnosis not present

## 2015-09-23 DIAGNOSIS — H2511 Age-related nuclear cataract, right eye: Secondary | ICD-10-CM | POA: Diagnosis not present

## 2015-09-23 DIAGNOSIS — H401231 Low-tension glaucoma, bilateral, mild stage: Secondary | ICD-10-CM | POA: Diagnosis not present

## 2015-09-23 DIAGNOSIS — H25011 Cortical age-related cataract, right eye: Secondary | ICD-10-CM | POA: Diagnosis not present

## 2015-09-23 DIAGNOSIS — H401232 Low-tension glaucoma, bilateral, moderate stage: Secondary | ICD-10-CM | POA: Diagnosis not present

## 2015-09-29 ENCOUNTER — Telehealth: Payer: Self-pay | Admitting: Pulmonary Disease

## 2015-09-29 MED ORDER — AMOXICILLIN-POT CLAVULANATE 875-125 MG PO TABS: 1.0000 | ORAL_TABLET | Freq: Two times a day (BID) | ORAL | Status: DC

## 2015-09-29 NOTE — Telephone Encounter (Signed)
Patient says that she took all of the prednisone and still having the same problem with the mucus.  No difference.  Coughing up a lot of yellow/ brown mucus.    [facility]:  [facility] [address]  No Known Allergies

## 2015-09-29 NOTE — Telephone Encounter (Signed)
augmentin 875 twice daily x 7 days Delsym 5 ml twice daily

## 2015-09-29 NOTE — Telephone Encounter (Signed)
Patient notified of Dr. Bari Mantis recommendations. Rx sent to pharmacy. Nothing further needed.Closing encounter

## 2015-09-30 ENCOUNTER — Telehealth: Payer: Self-pay | Admitting: Pulmonary Disease

## 2015-09-30 MED ORDER — LEVOFLOXACIN 500 MG PO TABS: 500.0000 mg | ORAL_TABLET | Freq: Every day | ORAL | Status: DC

## 2015-09-30 NOTE — Telephone Encounter (Signed)
Spoke with pt. States that she has developed diarrhea since starting Augmentin. Took 2 tablets yesterday and 1 tablet this AM. Wants antibiotic changed.  RA - please advise. Thanks.

## 2015-09-30 NOTE — Telephone Encounter (Signed)
levaquin 500 daily x7ds

## 2015-09-30 NOTE — Telephone Encounter (Signed)
lmtcb x1 

## 2015-09-30 NOTE — Telephone Encounter (Signed)
Called home # and LMTCB again Called mobile # and spoke with pt. She isa ware of recs. RX sent in. Nothing further needed

## 2015-10-12 ENCOUNTER — Telehealth: Payer: Self-pay | Admitting: Pulmonary Disease

## 2015-10-12 NOTE — Telephone Encounter (Signed)
Called spoke with pt. Appt scheduled to see RA tomorrow afternoon. Nothing further needed

## 2015-10-13 ENCOUNTER — Ambulatory Visit (INDEPENDENT_AMBULATORY_CARE_PROVIDER_SITE_OTHER)
Admission: RE | Admit: 2015-10-13 | Discharge: 2015-10-13 | Disposition: A | Discharge status: Home / Self Care | Payer: Medicare Other | Source: Ambulatory Visit | Attending: Pulmonary Disease | Admitting: Pulmonary Disease

## 2015-10-13 ENCOUNTER — Ambulatory Visit (INDEPENDENT_AMBULATORY_CARE_PROVIDER_SITE_OTHER): Payer: Medicare Other | Admitting: Pulmonary Disease

## 2015-10-13 ENCOUNTER — Encounter: Payer: Self-pay | Admitting: Pulmonary Disease

## 2015-10-13 VITALS — BP 110/84 | HR 73 | Ht 65.0 in | Wt 124.4 lb

## 2015-10-13 DIAGNOSIS — J700 Acute pulmonary manifestations due to radiation: Secondary | ICD-10-CM

## 2015-10-13 DIAGNOSIS — R053 Chronic cough: Secondary | ICD-10-CM

## 2015-10-13 DIAGNOSIS — R05 Cough: Secondary | ICD-10-CM

## 2015-10-13 MED ORDER — PREDNISONE 10 MG PO TABS: ORAL_TABLET | ORAL | Status: DC

## 2015-10-13 NOTE — Patient Instructions (Signed)
CXR today STOP fish oil Prednisone 10 mg tabs  Take 2 tabs daily with food x 5ds, then 1 tab daily with food x 5ds then STOP Take mucinex 600 mg twice daily x 10 days

## 2015-10-13 NOTE — Progress Notes (Signed)
   Subjective:    Patient ID: Michelle Levine, female    DOB: 1922-02-28, 81 y.o.   MRN: [id number]  HPI  81 year old never smoker presents for FU of chronic cough since 03/2014. She was diagnosed in 02/2014 with non-small cell lung cancer, adenocarcinoma. She presented with multiple pulmonary nodules but at least a stage IA (T1a, N0, M0) dominant 1.8 nodule in the right upper lobe, with other smaller bilateral pulmonary nodule suspicious for metastatic disease or synchronous primaries. The other subcentimeter nodules and a precarinal lymph node were negative on PET.  Foundation One markers were positive for MET mutation, but negative for EGFR or ALK. She underwent stereotactic radiotherapy to the right upper lobe lung nodule in 04/2014.   She is on furosemide for valvular regurgitation.  Echo in 9/14 showed moderate MR, moderate TR with RVSP of 47.   10/13/2015  Chief Complaint  Patient presents with  . Acute Visit    Cough, chest congestion; yellow/brown mucus   She has a chronic cough that predates radiation therapy She now presents with acute worsening of this cough, reports yellow sputum for 4-6 weeks Received prednisone taper and 08/2015 Augmentin caused diarrhea- given levaquin x 7ds  Denies wheeze, sinus drip or heartburn No problems walking & is very functional  CT chest from 10/16 was reviewed  Chest x-ray shows cardiomegaly without edema, no evidence of infiltrates or effusions   Significant tests/ events  CT chest 07/2014 New radiation change within the right upper lobe identified.  CT chest 08/2015 >> Progressive volume loss and wedge-shaped subpleural fibrosis/atelectasis posteriorly in the right upper lobe, attributed to prior radiation therapy precarinal lymph node and small pulmonary nodules bilaterally are unchanged    Review of Systems neg for any significant sore throat, dysphagia, itching, sneezing, nasal congestion or excess/ purulent secretions, fever,  chills, sweats, unintended wt loss, pleuritic or exertional cp, hempoptysis, orthopnea pnd or change in chronic leg swelling. Also denies presyncope, palpitations, heartburn, abdominal pain, nausea, vomiting, diarrhea or change in bowel or urinary habits, dysuria,hematuria, rash, arthralgias, visual complaints, headache, numbness weakness or ataxia.     Objective:   Physical Exam  Gen. Pleasant, well-nourished, in no distress ENT - no lesions, no post nasal drip Neck: No JVD, no thyromegaly, no carotid bruits Lungs: no use of accessory muscles, no dullness to percussion, clear without rales or rhonchi  Cardiovascular: Rhythm regular, heart sounds  normal, no murmurs or gallops, no peripheral edema Musculoskeletal: No deformities, no cyanosis or clubbing        Assessment & Plan:

## 2015-10-13 NOTE — Assessment & Plan Note (Signed)
CXR today STOP fish oil Take mucinex 600 mg twice daily x 10 days

## 2015-10-13 NOTE — Assessment & Plan Note (Addendum)
She does have changes of radiation pneumonitis in the right upper lobe Prednisone 10 mg tabs  Take 2 tabs daily with food x 5ds, then 1 tab daily with food x 5ds then STOP

## 2015-10-26 ENCOUNTER — Telehealth: Payer: Self-pay | Admitting: Pulmonary Disease

## 2015-10-26 NOTE — Telephone Encounter (Signed)
Patient says that she still has a lot of mucus in her throat.  Finished Prednisone and Mucinex, but has not gotten any relief.  Patient wants to know what else can be done to get rid of the mucus build up in her throat.    [facility]  No Known Allergies  PM - please advise in RA's absence.

## 2015-10-26 NOTE — Telephone Encounter (Signed)
Try Robitussin DM for cough, mucus

## 2015-10-26 NOTE — Telephone Encounter (Signed)
Left message for patient to call back  

## 2015-10-26 NOTE — Telephone Encounter (Signed)
Patient Returned call  [phone]

## 2015-10-27 NOTE — Telephone Encounter (Signed)
Patient Returned call  [phone]

## 2015-10-27 NOTE — Telephone Encounter (Signed)
Patient notified of Dr. Matilde Bash recommendations. Nothing further needed.

## 2015-11-04 ENCOUNTER — Telehealth: Payer: Self-pay | Admitting: Pulmonary Disease

## 2015-11-04 MED ORDER — PREDNISONE 10 MG PO TABS: ORAL_TABLET | ORAL | Status: DC

## 2015-11-04 NOTE — Telephone Encounter (Signed)
Spoke with pt, states she finished robitussin DM, states this has not helped with her mucus production.  C/o unchanged prod cough with yellow mucus that "catches in her throat"-denies sinus congestion, PND, fever.  Pt requesting further recs.  [facility] [address]   Sending to doc of the day as RA is unavailable.  MW please advise on recs.   Thanks!

## 2015-11-04 NOTE — Telephone Encounter (Signed)
Called and spoke with pt. Reviewed recs. Pt voiced understanding and had no further questions.

## 2015-11-04 NOTE — Telephone Encounter (Signed)
Prednisone 10 mg take  4 each am x 2 days,   2 each am x 2 days,  1 each am x 2 days and stop  mucinex dm 1200 mg twice daily prn

## 2015-11-04 NOTE — Telephone Encounter (Signed)
Called and spoke with pt. She stated that she picked up the prednisone from the pharmacy but refused to take mucinex. She states that she has took mucinex before and it does not help with her symptoms. I explained to her that if she feels like the prednisone is not helping to try the mucinex or call our office back. She voiced understanding and had no further questions.

## 2015-11-11 ENCOUNTER — Telehealth: Payer: Self-pay | Admitting: Pulmonary Disease

## 2015-11-11 NOTE — Telephone Encounter (Signed)
Patient says that she has tried Prednisone, Mucinex and she still cannot get rid of the mucus. She says she is constantly coughing up mucus.    Dr. Elsworth Soho, any further recommendations?

## 2015-11-12 NOTE — Telephone Encounter (Signed)
Pt is aware of RA's recommendation. Nothing further was needed.

## 2015-11-12 NOTE — Telephone Encounter (Signed)
Delsym cough syrup 5 ML twice a day x 10 days

## 2015-11-19 ENCOUNTER — Telehealth: Payer: Self-pay | Admitting: Pulmonary Disease

## 2015-11-19 MED ORDER — LEVOFLOXACIN 500 MG PO TABS: 500.0000 mg | ORAL_TABLET | Freq: Every day | ORAL | Status: DC

## 2015-11-19 NOTE — Telephone Encounter (Signed)
Levaquin 500 daily x 7days Stay on Mucinex

## 2015-11-19 NOTE — Telephone Encounter (Signed)
Spoke with patient, states that after completing all regimens given over the last month her mucus production and little cough has not improved. Pt notes no reduction in symptoms with most recent trial of Delsym. Pt has tried Delsym, Mucinex, Robitussin and Pred Taper(11/04/15). Pt wanting to know what next steps can be taken. Pt reports that mucus is still yellow and thick and gets stuck in throat - states that she feels the congestion and need to get it up. Please advise Dr Elsworth Soho. Thanks.  (Leave detailed message on machine when calling back)

## 2015-11-19 NOTE — Telephone Encounter (Signed)
Left detailed message on pt's answering machine at pt's request.  Rx called in to pt's pharmacy.  Instructed pt to call office if any further questions.

## 2015-11-19 NOTE — Telephone Encounter (Signed)
lmomtcb x1 

## 2015-11-27 ENCOUNTER — Telehealth: Payer: Self-pay | Admitting: Pulmonary Disease

## 2015-11-27 NOTE — Telephone Encounter (Signed)
Attempted to contact pt. Line was busy. Will try back.

## 2015-11-27 NOTE — Telephone Encounter (Signed)
Spoke with pt, scheduled for Monday morning with TP.  Nothing further needed.

## 2015-11-27 NOTE — Telephone Encounter (Signed)
lmtcb x1 for pt. 

## 2015-11-27 NOTE — Telephone Encounter (Signed)
Spoke with pt, c/o unchanged prod cough with yellow mucus.  Pt was given levaquin rx on 11/19/15 and instructed to take Mucinex by RA for these same symptoms.   Denies sinus congestion, chest pain, fever.   [facility] [address]    Pt requesting further recs.    RA please advise.  Thanks!!

## 2015-11-27 NOTE — Telephone Encounter (Signed)
Patient Returned call [phone]

## 2015-11-27 NOTE — Telephone Encounter (Signed)
Please arrange follow-up office visit with TP

## 2015-11-30 ENCOUNTER — Encounter: Payer: Self-pay | Admitting: Adult Health

## 2015-11-30 ENCOUNTER — Ambulatory Visit (INDEPENDENT_AMBULATORY_CARE_PROVIDER_SITE_OTHER): Payer: Medicare Other | Admitting: Adult Health

## 2015-11-30 VITALS — BP 136/68 | HR 71 | Temp 97.8°F | Ht 65.0 in | Wt 123.0 lb

## 2015-11-30 DIAGNOSIS — R05 Cough: Secondary | ICD-10-CM

## 2015-11-30 DIAGNOSIS — J700 Acute pulmonary manifestations due to radiation: Secondary | ICD-10-CM

## 2015-11-30 DIAGNOSIS — C3491 Malignant neoplasm of unspecified part of right bronchus or lung: Secondary | ICD-10-CM

## 2015-11-30 DIAGNOSIS — R053 Chronic cough: Secondary | ICD-10-CM

## 2015-11-30 NOTE — Assessment & Plan Note (Signed)
We discussed various treatments including GERD , drainage and cough control  Says she has tried many things and nothing works Suspect is combination with previous XRT with resultant fibrosis.  Plan  May use mucinex DM Twice daily  As needed  Cough Stop fish oil .  May discuss with primary doctor that nadolol may make your cough worse.  Follow up Dr. Elsworth Soho  In 6 months and As needed

## 2015-11-30 NOTE — Patient Instructions (Signed)
May use mucinex DM Twice daily  As needed  Cough Stop fish oil .  May discuss with primary doctor that nadolol may make your cough worse.  Follow up Dr. Elsworth Soho  In 6 months and As needed

## 2015-11-30 NOTE — Assessment & Plan Note (Signed)
?   Radiation Pneumonitis no sign improvement with steroids  Hold on additional steroids for now.

## 2015-11-30 NOTE — Progress Notes (Signed)
Subjective:    Patient ID: Michelle Levine, female    DOB: 1921-11-15, 81 y.o.   MRN: [id number]  HPI 82 yo female never smoker with non small cell cancer, adenocarcinoma dx 42015  Dominant 1.8 cm RUL nodule with small bilateral pulmonary ndoules suspcious for mets. S/p XRT to RUL nodule in 04/2014   TEST  Echo in 9/14 showed moderate MR, moderate TR with RVSP of 47.  08/2015 CT chest >Progressive volume loss and wedge-shaped subpleural fibrosis/atelectasis posteriorly in the right upper lobe, attributed to prior radiation therapy.  2. No evidence of local recurrence or metastatic disease.      11/30/2015 Acute OV  Complains of persistent cough with  yellow colored mucus . She has had a chronic cough for last couple of years, seems worse for last last few months .  Has been treated with prednisone course and zpak with no sign improvement .  Felt may has radiation pneumonitis with previous hx of lung cancer s/p XRT to RUL in 2015.  Most recent CT chest reviewed showed progressive RUL fibrosis w/ no evidence of metastatic dz.  We discussed she may have some daily cough. She said she can live with the cough she has now.  Says she did not understand that radiation may cause these changes.   Denies any chest tightness, sinus pressure/drainage, fever, nausea or vomiting.   Past Medical History  Diagnosis Date  . Aortic insufficiency   . Mitral insufficiency   . Tricuspid regurgitation   . Pulmonary hypertension ([place])   . CHF (congestive heart failure) ([place])   . COPD (chronic obstructive pulmonary disease) ([place])   . Hypertension   . Cataracts, bilateral   . CAD (coronary artery disease)   . Thyroid disease    Current Outpatient Prescriptions on File Prior to Visit  Medication Sig Dispense Refill  . amLODipine (NORVASC) 5 MG tablet Take 5 mg by mouth daily.    Marland Kitchen aspirin EC 81 MG tablet Take 81 mg by mouth 3 (three) times a week. Monday, Wednesday and Friday    . Calcium  Carbonate-Vitamin D (CALCIUM + D PO) Take 1 capsule by mouth daily.     . citalopram (CELEXA) 20 MG tablet Take 20 mg by mouth daily.     . fish oil-omega-3 fatty acids 1000 MG capsule Take 2 g by mouth every evening.     . furosemide (LASIX) 40 MG tablet Take 40 mg by mouth daily.     Marland Kitchen KLOR-CON M10 10 MEQ tablet Take 10 mEq by mouth daily.    . methimazole (TAPAZOLE) 10 MG tablet Take 5 mg by mouth daily.     . Multiple Vitamin (MULITIVITAMIN WITH MINERALS) TABS Take 1 tablet by mouth daily.     . nadolol (CORGARD) 40 MG tablet Take 20 mg by mouth daily.     . zoledronic acid (RECLAST) 5 MG/100ML SOLN Inject 5 mg into the vein. Reported on 11/30/2015     No current facility-administered medications on file prior to visit.     Review of Systems Constitutional:   No  weight loss, night sweats,  Fevers, chills, fatigue, or  lassitude.  HEENT:   No headaches,  Difficulty swallowing,  Tooth/dental problems, or  Sore throat,                No sneezing, itching, ear ache, nasal congestion, post nasal drip,   CV:  No chest pain,  Orthopnea, PND, swelling in lower extremities, anasarca, dizziness,  palpitations, syncope.   GI  No heartburn, indigestion, abdominal pain, nausea, vomiting, diarrhea, change in bowel habits, loss of appetite, bloody stools.   Resp: No shortness of breath with exertion or at rest.  No excess mucus, no productive cough,  No non-productive cough,  No coughing up of blood.  No change in color of mucus.  No wheezing.  No chest wall deformity  Skin: no rash or lesions.  GU: no dysuria, change in color of urine, no urgency or frequency.  No flank pain, no hematuria   MS:  No joint pain or swelling.  No decreased range of motion.  No back pain.  Psych:  No change in mood or affect. No depression or anxiety.  No memory loss.         Objective:   Physical Exam  Filed Vitals:   11/30/15 0909  BP: 136/68  Pulse: 71  Temp: 97.8 F (36.6 C)  TempSrc: Oral    Height: '5\' 5"'$  (1.651 m)  Weight: 123 lb (55.792 kg)  SpO2: 95%    GEN: A/Ox3; pleasant , NAD,   HEENT:  [place]/AT,  EACs-clear, TMs-wnl, NOSE-clear, THROAT-clear, no lesions, no postnasal drip or exudate noted.   NECK:  Supple w/ fair ROM; no JVD; normal carotid impulses w/o bruits; no thyromegaly or nodules palpated; no lymphadenopathy.  RESP  Clear  P & A; w/o, wheezes/ rales/ or rhonchi.no accessory muscle use, no dullness to percussion  CARD:  RRR, no m/r/g  , no peripheral edema, pulses intact, no cyanosis or clubbing.  GI:   Soft & nt; nml bowel sounds; no organomegaly or masses detected.  Musco: Warm bil, no deformities or joint swelling noted.   Neuro: alert, no focal deficits noted.    Skin: Warm, no lesions or rashes        Assessment & Plan:

## 2015-11-30 NOTE — Assessment & Plan Note (Signed)
Cont follow up with oncology with serial CT

## 2015-12-07 NOTE — Progress Notes (Signed)
Reviewed & agree with plan  

## 2015-12-14 DIAGNOSIS — R7301 Impaired fasting glucose: Secondary | ICD-10-CM | POA: Diagnosis not present

## 2015-12-14 DIAGNOSIS — M859 Disorder of bone density and structure, unspecified: Secondary | ICD-10-CM | POA: Diagnosis not present

## 2015-12-14 DIAGNOSIS — E0590 Thyrotoxicosis, unspecified without thyrotoxic crisis or storm: Secondary | ICD-10-CM | POA: Diagnosis not present

## 2015-12-14 DIAGNOSIS — I10 Essential (primary) hypertension: Secondary | ICD-10-CM | POA: Diagnosis not present

## 2015-12-21 DIAGNOSIS — Z0000 Encounter for general adult medical examination without abnormal findings: Secondary | ICD-10-CM | POA: Diagnosis not present

## 2015-12-21 DIAGNOSIS — E041 Nontoxic single thyroid nodule: Secondary | ICD-10-CM | POA: Diagnosis not present

## 2015-12-21 DIAGNOSIS — M7071 Other bursitis of hip, right hip: Secondary | ICD-10-CM | POA: Diagnosis not present

## 2015-12-21 DIAGNOSIS — M545 Low back pain: Secondary | ICD-10-CM | POA: Diagnosis not present

## 2015-12-21 DIAGNOSIS — Z1389 Encounter for screening for other disorder: Secondary | ICD-10-CM | POA: Diagnosis not present

## 2015-12-21 DIAGNOSIS — M7072 Other bursitis of hip, left hip: Secondary | ICD-10-CM | POA: Diagnosis not present

## 2015-12-21 DIAGNOSIS — Z6821 Body mass index (BMI) 21.0-21.9, adult: Secondary | ICD-10-CM | POA: Diagnosis not present

## 2015-12-21 DIAGNOSIS — R808 Other proteinuria: Secondary | ICD-10-CM | POA: Diagnosis not present

## 2015-12-21 DIAGNOSIS — M859 Disorder of bone density and structure, unspecified: Secondary | ICD-10-CM | POA: Diagnosis not present

## 2015-12-21 DIAGNOSIS — I341 Nonrheumatic mitral (valve) prolapse: Secondary | ICD-10-CM | POA: Diagnosis not present

## 2015-12-21 DIAGNOSIS — I509 Heart failure, unspecified: Secondary | ICD-10-CM | POA: Diagnosis not present

## 2015-12-21 DIAGNOSIS — Z1231 Encounter for screening mammogram for malignant neoplasm of breast: Secondary | ICD-10-CM | POA: Diagnosis not present

## 2015-12-21 DIAGNOSIS — R413 Other amnesia: Secondary | ICD-10-CM | POA: Diagnosis not present

## 2015-12-31 NOTE — Progress Notes (Signed)
HPI: FU aortic insufficiency, mitral insufficiency, and tricuspid regurgitation with moderate pulmonary hypertension. Last echocardiogram in September 2014 showed an ejection fraction of 50-55%, moderate aortic insufficiency, bileaflet mitral valve prolapse with moderate mitral regurgitation, moderate tricuspid regurgitation and severe left atrial enlargement. Patient has been diagnosed with lung cancer. Since I last saw her, the patient denies any dyspnea on exertion, orthopnea, PND, pedal edema, palpitations, syncope or chest pain.   Current Outpatient Prescriptions  Medication Sig Dispense Refill  . amLODipine (NORVASC) 5 MG tablet Take 5 mg by mouth daily.    Marland Kitchen aspirin EC 81 MG tablet Take 81 mg by mouth 3 (three) times a week. Monday, Wednesday and Friday    . Calcium Carbonate-Vitamin D (CALCIUM + D PO) Take 1 capsule by mouth daily.     . citalopram (CELEXA) 20 MG tablet Take 20 mg by mouth daily.     . fish oil-omega-3 fatty acids 1000 MG capsule Take 2 g by mouth every evening.     . furosemide (LASIX) 40 MG tablet Take 40 mg by mouth daily.     Marland Kitchen KLOR-CON M10 10 MEQ tablet Take 10 mEq by mouth daily.    . methimazole (TAPAZOLE) 10 MG tablet Take 5 mg by mouth daily.     . Multiple Vitamin (MULITIVITAMIN WITH MINERALS) TABS Take 1 tablet by mouth daily.     . nadolol (CORGARD) 40 MG tablet Take 20 mg by mouth daily.     . zoledronic acid (RECLAST) 5 MG/100ML SOLN Inject 5 mg into the vein. Reported on 11/30/2015     No current facility-administered medications for this visit.     Past Medical History  Diagnosis Date  . Aortic insufficiency   . Mitral insufficiency   . Tricuspid regurgitation   . Pulmonary hypertension ([place])   . CHF (congestive heart failure) ([place])   . COPD (chronic obstructive pulmonary disease) ([place])   . Hypertension   . Cataracts, bilateral   . CAD (coronary artery disease)   . Thyroid disease     Past Surgical History  Procedure Laterality  Date  . Cataract extraction    . Breast lumpectomy    . Appendectomy    . Abdominal hysterectomy    . Eye surgery      Social History   Social History  . Marital Status: Married    Spouse Name: N/A  . Number of Children: N/A  . Years of Education: N/A   Occupational History  . retired    Social History Main Topics  . Smoking status: Never Smoker   . Smokeless tobacco: Never Used  . Alcohol Use: No  . Drug Use: No  . Sexual Activity: Not on file   Other Topics Concern  . Not on file   Social History Narrative    Family History  Problem Relation Age of Onset  . Heart attack Mother 27  . Pneumonia Father 30  . Heart attack Brother   . Leukemia Brother   . Heart attack Brother   . Leukemia Brother     ROS: no fevers or chills, productive cough, hemoptysis, dysphasia, odynophagia, melena, hematochezia, dysuria, hematuria, rash, seizure activity, orthopnea, PND, pedal edema, claudication. Remaining systems are negative.  Physical Exam: Well-developed well-nourished in no acute distress.  Skin is warm and dry.  HEENT is normal.  Neck is supple.  Chest is clear to auscultation with normal expansion.  Cardiovascular exam is regular rate and rhythm. 2/6 systolic murmur  left sternal border and 2/6 Systolic murmur apex Abdominal exam nontender or distended. No masses palpated. Extremities show no edema. neuro grossly intact  ECG Sinus rhythm at a rate of 69. First-degree AV block. Occasional PVC. Nonspecific ST changes.

## 2016-01-01 ENCOUNTER — Ambulatory Visit (INDEPENDENT_AMBULATORY_CARE_PROVIDER_SITE_OTHER): Payer: Medicare Other | Admitting: Cardiology

## 2016-01-01 ENCOUNTER — Encounter: Payer: Self-pay | Admitting: Cardiology

## 2016-01-01 VITALS — BP 130/68 | HR 69 | Ht 65.0 in | Wt 122.0 lb

## 2016-01-01 DIAGNOSIS — I10 Essential (primary) hypertension: Secondary | ICD-10-CM

## 2016-01-01 DIAGNOSIS — I351 Nonrheumatic aortic (valve) insufficiency: Secondary | ICD-10-CM

## 2016-01-01 NOTE — Patient Instructions (Signed)
Your physician wants you to follow-up in: ONE YEAR WITH DR CRENSHAW You will receive a reminder letter in the mail two months in advance. If you don't receive a letter, please call our office to schedule the follow-up appointment.   If you need a refill on your cardiac medications before your next appointment, please call your pharmacy.  

## 2016-01-01 NOTE — Assessment & Plan Note (Signed)
Blood pressure controlled. Continue present medications. 

## 2016-01-01 NOTE — Assessment & Plan Note (Signed)
Patient has moderate aortic insufficiency, moderate mitral regurgitation and moderate tricuspid regurgitation. Given age we will treat conservatively. She is clear that she would never consider valve surgery regardless. Will not repeat echocardiogram. Continue present dose of Lasix.   

## 2016-01-11 ENCOUNTER — Encounter (HOSPITAL_COMMUNITY): Payer: Self-pay

## 2016-01-11 ENCOUNTER — Ambulatory Visit (HOSPITAL_COMMUNITY)
Admission: RE | Admit: 2016-01-11 | Discharge: 2016-01-11 | Disposition: A | Discharge status: Home / Self Care | Payer: Medicare Other | Source: Ambulatory Visit | Attending: Internal Medicine | Admitting: Internal Medicine

## 2016-01-11 ENCOUNTER — Ambulatory Visit: Payer: Medicare Other | Admitting: Adult Health

## 2016-01-11 ENCOUNTER — Other Ambulatory Visit (HOSPITAL_COMMUNITY): Payer: Self-pay | Admitting: Internal Medicine

## 2016-01-11 DIAGNOSIS — M810 Age-related osteoporosis without current pathological fracture: Secondary | ICD-10-CM | POA: Diagnosis not present

## 2016-01-11 MED ORDER — ZOLEDRONIC ACID 5 MG/100ML IV SOLN
5.0000 mg | Freq: Once | INTRAVENOUS | Status: AC
  Administered 2016-01-11: 5 mg via INTRAVENOUS
  Filled 2016-01-11: qty 100

## 2016-01-11 MED ORDER — SODIUM CHLORIDE 0.9 % IV SOLN: Freq: Once | INTRAVENOUS | Status: DC

## 2016-01-11 NOTE — Discharge Instructions (Signed)
Drink  Fluids/water as tolerated over the next 72 hours Tylenol or ibuprofen as needed Continue Calcium and Vit D as directed by your MD Drink fluids/water as tolerated over the next 72 hours    Reclast What is this medicine? ZOLEDRONIC ACID (ZOE le dron ik AS id) lowers the amount of calcium loss from bone. It is used to treat Paget's disease and osteoporosis in women. This medicine may be used for other purposes; ask your health care provider or pharmacist if you have questions. What should I tell my health care provider before I take this medicine? They need to know if you have any of these conditions: -aspirin-sensitive asthma -cancer, especially if you are receiving medicines used to treat cancer -dental disease or wear dentures -infection -kidney disease -low levels of calcium in the blood -past surgery on the parathyroid gland or intestines -receiving corticosteroids like dexamethasone or prednisone -an unusual or allergic reaction to zoledronic acid, other medicines, foods, dyes, or preservatives -pregnant or trying to get pregnant -breast-feeding How should I use this medicine? This medicine is for infusion into a vein. It is given by a health care professional in a hospital or clinic setting. Talk to your pediatrician regarding the use of this medicine in children. This medicine is not approved for use in children. Overdosage: If you think you have taken too much of this medicine contact a poison control center or emergency room at once. NOTE: This medicine is only for you. Do not share this medicine with others. What if I miss a dose? It is important not to miss your dose. Call your doctor or health care professional if you are unable to keep an appointment. What may interact with this medicine? -certain antibiotics given by injection -NSAIDs, medicines for pain and inflammation, like ibuprofen or naproxen -some diuretics like bumetanide, furosemide -teriparatide This  list may not describe all possible interactions. Give your health care provider a list of all the medicines, herbs, non-prescription drugs, or dietary supplements you use. Also tell them if you smoke, drink alcohol, or use illegal drugs. Some items may interact with your medicine. What should I watch for while using this medicine? Visit your doctor or health care professional for regular checkups. It may be some time before you see the benefit from this medicine. Do not stop taking your medicine unless your doctor tells you to. Your doctor may order blood tests or other tests to see how you are doing. Women should inform their doctor if they wish to become pregnant or think they might be pregnant. There is a potential for serious side effects to an unborn child. Talk to your health care professional or pharmacist for more information. You should make sure that you get enough calcium and vitamin D while you are taking this medicine. Discuss the foods you eat and the vitamins you take with your health care professional. Some people who take this medicine have severe bone, joint, and/or muscle pain. This medicine may also increase your risk for jaw problems or a broken thigh bone. Tell your doctor right away if you have severe pain in your jaw, bones, joints, or muscles. Tell your doctor if you have any pain that does not go away or that gets worse. Tell your dentist and dental surgeon that you are taking this medicine. You should not have major dental surgery while on this medicine. See your dentist to have a dental exam and fix any dental problems before starting this medicine. Take good care  of your teeth while on this medicine. Make sure you see your dentist for regular follow-up appointments. What side effects may I notice from receiving this medicine? Side effects that you should report to your doctor or health care professional as soon as possible: -allergic reactions like skin rash, itching or hives,  swelling of the face, lips, or tongue -anxiety, confusion, or depression -breathing problems -changes in vision -eye pain -feeling faint or lightheaded, falls -jaw pain, especially after dental work -mouth sores -muscle cramps, stiffness, or weakness -redness, blistering, peeling or loosening of the skin, including inside the mouth -trouble passing urine or change in the amount of urine Side effects that usually do not require medical attention (report to your doctor or health care professional if they continue or are bothersome): -bone, joint, or muscle pain -constipation -diarrhea -fever -hair loss -irritation at site where injected -loss of appetite -nausea, vomiting -stomach upset -trouble sleeping -trouble swallowing -weak or tired This list may not describe all possible side effects. Call your doctor for medical advice about side effects. You may report side effects to FDA at 1-800-FDA-1088. Where should I keep my medicine? This drug is given in a hospital or clinic and will not be stored at home. NOTE: This sheet is a summary. It may not cover all possible information. If you have questions about this medicine, talk to your doctor, pharmacist, or health care provider.    2016, Elsevier/Gold Standard. (2014-03-22 14:19:57)

## 2016-02-17 ENCOUNTER — Other Ambulatory Visit: Payer: Medicare Other

## 2016-02-19 ENCOUNTER — Encounter (HOSPITAL_COMMUNITY): Payer: Self-pay

## 2016-02-19 ENCOUNTER — Other Ambulatory Visit (HOSPITAL_BASED_OUTPATIENT_CLINIC_OR_DEPARTMENT_OTHER): Payer: Medicare Other

## 2016-02-19 ENCOUNTER — Ambulatory Visit (HOSPITAL_COMMUNITY)
Admission: RE | Admit: 2016-02-19 | Discharge: 2016-02-19 | Disposition: A | Discharge status: Home / Self Care | Payer: Medicare Other | Source: Ambulatory Visit | Attending: Internal Medicine | Admitting: Internal Medicine

## 2016-02-19 DIAGNOSIS — I5189 Other ill-defined heart diseases: Secondary | ICD-10-CM | POA: Diagnosis not present

## 2016-02-19 DIAGNOSIS — R918 Other nonspecific abnormal finding of lung field: Secondary | ICD-10-CM | POA: Insufficient documentation

## 2016-02-19 DIAGNOSIS — C3491 Malignant neoplasm of unspecified part of right bronchus or lung: Secondary | ICD-10-CM

## 2016-02-19 DIAGNOSIS — C3411 Malignant neoplasm of upper lobe, right bronchus or lung: Secondary | ICD-10-CM

## 2016-02-19 LAB — CBC WITH DIFFERENTIAL/PLATELET
BASO%: 0.6 % (ref 0.0–2.0)
BASOS ABS: 0.1 10*3/uL (ref 0.0–0.1)
EOS%: 0.7 % (ref 0.0–7.0)
Eosinophils Absolute: 0.1 10*3/uL (ref 0.0–0.5)
HCT: 40.8 % (ref 34.8–46.6)
HGB: 13.3 g/dL (ref 11.6–15.9)
LYMPH%: 5.4 % — AB (ref 14.0–49.7)
MCH: 29.5 pg (ref 25.1–34.0)
MCHC: 32.6 g/dL (ref 31.5–36.0)
MCV: 90.5 fL (ref 79.5–101.0)
MONO#: 1 10*3/uL — AB (ref 0.1–0.9)
MONO%: 7.6 % (ref 0.0–14.0)
NEUT#: 11.7 10*3/uL — ABNORMAL HIGH (ref 1.5–6.5)
NEUT%: 85.7 % — AB (ref 38.4–76.8)
PLATELETS: 187 10*3/uL (ref 145–400)
RBC: 4.51 10*6/uL (ref 3.70–5.45)
RDW: 13.4 % (ref 11.2–14.5)
WBC: 13.6 10*3/uL — ABNORMAL HIGH (ref 3.9–10.3)
lymph#: 0.7 10*3/uL — ABNORMAL LOW (ref 0.9–3.3)

## 2016-02-19 LAB — COMPREHENSIVE METABOLIC PANEL
ALT: 10 U/L (ref 0–55)
AST: 18 U/L (ref 5–34)
Albumin: 3.2 g/dL — ABNORMAL LOW (ref 3.5–5.0)
Alkaline Phosphatase: 61 U/L (ref 40–150)
Anion Gap: 10 mEq/L (ref 3–11)
BILIRUBIN TOTAL: 0.57 mg/dL (ref 0.20–1.20)
BUN: 21.3 mg/dL (ref 7.0–26.0)
CO2: 24 meq/L (ref 22–29)
CREATININE: 0.8 mg/dL (ref 0.6–1.1)
Calcium: 9.2 mg/dL (ref 8.4–10.4)
Chloride: 106 mEq/L (ref 98–109)
EGFR: 61 mL/min/{1.73_m2} — AB (ref >90)
GLUCOSE: 99 mg/dL (ref 70–140)
Potassium: 3.9 mEq/L (ref 3.5–5.1)
SODIUM: 141 meq/L (ref 136–145)
TOTAL PROTEIN: 7.1 g/dL (ref 6.4–8.3)

## 2016-02-24 ENCOUNTER — Telehealth: Payer: Self-pay | Admitting: Internal Medicine

## 2016-02-24 ENCOUNTER — Ambulatory Visit (HOSPITAL_BASED_OUTPATIENT_CLINIC_OR_DEPARTMENT_OTHER): Payer: Medicare Other | Admitting: Internal Medicine

## 2016-02-24 ENCOUNTER — Encounter: Payer: Self-pay | Admitting: Internal Medicine

## 2016-02-24 VITALS — BP 129/78 | HR 78 | Temp 98.4°F | Resp 17 | Ht 65.0 in | Wt 121.5 lb

## 2016-02-24 DIAGNOSIS — C3411 Malignant neoplasm of upper lobe, right bronchus or lung: Secondary | ICD-10-CM | POA: Diagnosis not present

## 2016-02-24 DIAGNOSIS — C3491 Malignant neoplasm of unspecified part of right bronchus or lung: Secondary | ICD-10-CM

## 2016-02-24 NOTE — Telephone Encounter (Signed)
left msg for Michelle Levine next yr

## 2016-02-24 NOTE — Progress Notes (Signed)
[facility] [phone]   [phone]  OFFICE PROGRESS NOTE  Jerlyn Ly, MD [place] [address]  DIAGNOSIS: non-small cell lung cancer, adenocarcinoma, with positive MET mutation, with multiple pulmonary nodules but at least a stage IA (T1a, N0, M0) involving the right upper lobe, with other bilateral pulmonary nodule suspicious for metastatic disease or synchronous primaries.  MOLECULAR STUDIES: Foundation One: Positive for MET exon 14 splice site (2707+8M>L, TET2 Q 1524*. Negative for EGFR, ALK, RET, BRAF, KRAS, ERBB2.  PRIOR THERAPY: Stereotactic radiotherapy to the right upper lobe lung nodule expected to be completed on 05/06/2014  CURRENT THERAPY: None.  INTERVAL HISTORY: Michelle Levine 82 y.o. female returns to the clinic today for 6 months followup visit.  The patient is feeling fine today with no specific complaints except for mild cough and sputum production in the morning. She has no chest pain, shortness of breath or hemoptysis. She has no nausea or vomiting, no fever or chills. She has no significant weight loss or night sweats. She had repeat CT scan of the chest performed recently and she is here for evaluation and discussion of her scan results.  MEDICAL HISTORY: Past Medical History  Diagnosis Date  . Aortic insufficiency   . Mitral insufficiency   . Tricuspid regurgitation   . Pulmonary hypertension ([place])   . CHF (congestive heart failure) ([place])   . COPD (chronic obstructive pulmonary disease) ([place])   . Hypertension   . Cataracts, bilateral   . CAD (coronary artery disease)   . Thyroid disease     ALLERGIES:  has No Known Allergies.  MEDICATIONS:  Current Outpatient Prescriptions  Medication Sig Dispense Refill  . amLODipine (NORVASC) 5 MG tablet Take 5 mg by mouth daily.    Marland Kitchen aspirin EC 81 MG tablet Take 81 mg by mouth 3 (three) times a week. Monday, Wednesday and Friday    . Calcium  Carbonate-Vitamin D (CALCIUM + D PO) Take 1 capsule by mouth daily.     . citalopram (CELEXA) 20 MG tablet Take 20 mg by mouth daily.     . fish oil-omega-3 fatty acids 1000 MG capsule Take 2 g by mouth every evening.     . furosemide (LASIX) 40 MG tablet Take 40 mg by mouth daily.     Marland Kitchen KLOR-CON M10 10 MEQ tablet Take 10 mEq by mouth daily.    . methimazole (TAPAZOLE) 10 MG tablet Take 5 mg by mouth daily.     . Multiple Vitamin (MULITIVITAMIN WITH MINERALS) TABS Take 1 tablet by mouth daily.     . nadolol (CORGARD) 40 MG tablet Take 20 mg by mouth daily.     . zoledronic acid (RECLAST) 5 MG/100ML SOLN Inject 5 mg into the vein. Reported on 11/30/2015     No current facility-administered medications for this visit.    SURGICAL HISTORY:  Past Surgical History  Procedure Laterality Date  . Cataract extraction    . Breast lumpectomy    . Appendectomy    . Abdominal hysterectomy    . Eye surgery      REVIEW OF SYSTEMS:  A comprehensive review of systems was negative except for: Respiratory: positive for cough and sputum   PHYSICAL EXAMINATION: General appearance: alert, cooperative and no distress Head: Normocephalic, without obvious abnormality, atraumatic Neck: no adenopathy, no JVD, supple, symmetrical, trachea midline and thyroid not enlarged, symmetric, no tenderness/mass/nodules Lymph nodes: Cervical, supraclavicular, and axillary nodes normal. Resp:  clear to auscultation bilaterally Back: symmetric, no curvature. ROM normal. No CVA tenderness. Cardio: regular rate and rhythm, S1, S2 normal, no murmur, click, rub or gallop GI: soft, non-tender; bowel sounds normal; no masses,  no organomegaly Extremities: extremities normal, atraumatic, no cyanosis or edema Neurologic: Alert and oriented X 3, normal strength and tone. Normal symmetric reflexes. Normal coordination and gait  ECOG PERFORMANCE STATUS: 1 - Symptomatic but completely ambulatory  Blood pressure 129/78, pulse 78,  temperature 98.4 F (36.9 C), temperature source Oral, resp. rate 17, height 5' 5"  (1.651 m), weight 121 lb 8 oz (55.112 kg), SpO2 98 %.  LABORATORY DATA: Lab Results  Component Value Date   WBC 13.6* 02/19/2016   HGB 13.3 02/19/2016   HCT 40.8 02/19/2016   MCV 90.5 02/19/2016   PLT 187 02/19/2016      Chemistry      Component Value Date/Time   NA 141 02/19/2016 0812   NA 138 12/06/2013 1440   K 3.9 02/19/2016 0812   K 3.6 12/06/2013 1440   CL 105 12/06/2013 1440   CO2 24 02/19/2016 0812   CO2 27 12/06/2013 1440   BUN 21.3 02/19/2016 0812   BUN 20 12/06/2013 1440   CREATININE 0.8 02/19/2016 0812   CREATININE 0.8 12/06/2013 1440      Component Value Date/Time   CALCIUM 9.2 02/19/2016 0812   CALCIUM 8.9 12/06/2013 1440   ALKPHOS 61 02/19/2016 0812   ALKPHOS 76 03/22/2010 0450   AST 18 02/19/2016 0812   AST 23 03/22/2010 0450   ALT 10 02/19/2016 0812   ALT 29 03/22/2010 0450   BILITOT 0.57 02/19/2016 0812   BILITOT 0.6 03/22/2010 0450       RADIOGRAPHIC STUDIES: Ct Chest Wo Contrast  02/19/2016  CLINICAL DATA:  Lung cancer diagnosed in 2015. Restaging post radiation therapy. EXAM: CT CHEST WITHOUT CONTRAST TECHNIQUE: Multidetector CT imaging of the chest was performed following the standard protocol without IV contrast. COMPARISON:  Radiographs 10/13/2015.  CT 08/12/2015 and 02/09/2015. FINDINGS: Mediastinum/Nodes: 12 mm precarinal node on image 33 is unchanged. No new or enlarging mediastinal, hilar or axillary lymph nodes are seen. There is stable nodularity of the thyroid gland. The heart size is normal. There are stable intracardiac calcifications, some of which may be associated with the mitral annulus. In correlation with prior studies, some of these appear to be within the lumen of the left ventricle. They appear unchanged. Atherosclerosis of the aorta, great vessels and coronary arteries again noted. Lungs/Pleura: There is no pleural effusion. The wedge-shaped  consolidation volume loss posteriorly in the right upper lobe are unchanged. Additional areas of scattered scarring are stable, most prominent within the right middle lobe and superior segment of the left lower lobe. In addition, scattered pulmonary nodules are stable, largest measuring 6 mm in the right lower lobe on image 50. No new or enlarging nodules are identified. Upper abdomen: The visualized upper abdomen appears stable without suspicious findings. There is a small calcified splenic artery aneurysm. Musculoskeletal/Chest wall: There is no chest wall mass or suspicious osseous finding. IMPRESSION: 1. Stable chest CT demonstrating no evidence of local recurrence or metastatic disease. 2. The right apical wedge-shaped consolidation and volume loss are stable, attributed to radiation therapy. 3. Scattered small pulmonary nodules and a mildly prominent precarinal lymph node are unchanged. 4. Grossly stable multinodular quarter. 5. Stable intracardiac calcifications, some of which may be associated with chronic left ventricular mural thrombus. Electronically Signed   By: Caryl Comes.D.  On: 02/19/2016 09:46   ASSESSMENT AND PLAN: This is a very pleasant 82 years old white female recently diagnosed with metastatic non-small cell lung cancer, adenocarcinoma with right upper lobe lung nodule in addition to smaller bilateral pulmonary nodules and positive MET mutation.  She completed a stereotactic radiotherapy to the right upper lobe pulmonary nodule under the care of Dr. Pablo Ledger.  The patient has been doing fine today with no specific complaints except for mild cough with sputum production. She tried over-the-counter medication including Mucinex with mild improvement. Her recent CT scan of the chest showed no evidence for disease progression. I discussed the scan results with the patient. I recommended for her to continue on observation with repeat CT scan of the chest in one year. She was advised  to call immediately if she has any concerning symptoms in the interval.  The patient voices understanding of current disease status and treatment options and is in agreement with the current care plan.  All questions were answered. The patient knows to call the clinic with any problems, questions or concerns. We can certainly see the patient much sooner if necessary.  Disclaimer: This note was dictated with voice recognition software. Similar sounding words can inadvertently be transcribed and may not be corrected upon review.

## 2016-03-28 DIAGNOSIS — H401213 Low-tension glaucoma, right eye, severe stage: Secondary | ICD-10-CM | POA: Diagnosis not present

## 2016-03-28 DIAGNOSIS — H2511 Age-related nuclear cataract, right eye: Secondary | ICD-10-CM | POA: Diagnosis not present

## 2016-03-28 DIAGNOSIS — H401223 Low-tension glaucoma, left eye, severe stage: Secondary | ICD-10-CM | POA: Diagnosis not present

## 2016-04-20 ENCOUNTER — Encounter: Payer: Self-pay | Admitting: Pulmonary Disease

## 2016-04-20 ENCOUNTER — Ambulatory Visit (INDEPENDENT_AMBULATORY_CARE_PROVIDER_SITE_OTHER): Payer: Medicare Other | Admitting: Pulmonary Disease

## 2016-04-20 VITALS — BP 102/60 | HR 101 | Ht 65.0 in | Wt 128.0 lb

## 2016-04-20 DIAGNOSIS — R918 Other nonspecific abnormal finding of lung field: Secondary | ICD-10-CM | POA: Diagnosis not present

## 2016-04-20 DIAGNOSIS — R05 Cough: Secondary | ICD-10-CM | POA: Diagnosis not present

## 2016-04-20 DIAGNOSIS — R053 Chronic cough: Secondary | ICD-10-CM

## 2016-04-20 MED ORDER — BENZONATATE 200 MG PO CAPS: 200.0000 mg | ORAL_CAPSULE | Freq: Three times a day (TID) | ORAL | Status: DC | PRN

## 2016-04-20 NOTE — Assessment & Plan Note (Signed)
Trial of benzonatate 200 mg thrice daily as needed for cough # 45  We can use hydrocodone cough syrup if worse-I am concerned about your balance with this, hence better avoid

## 2016-04-20 NOTE — Assessment & Plan Note (Signed)
Follow-up surveillance scans arranged by oncology

## 2016-04-20 NOTE — Patient Instructions (Signed)
Trial of benzonatate 200 mg thrice daily as needed for cough # 45  We can use hydrocodone cough syrup if worse-I am concerned about your balance with this

## 2016-04-20 NOTE — Progress Notes (Signed)
   Subjective:    Patient ID: Michelle Levine, female    DOB: June 17, 1922, 82 y.o.   MRN: [id number]  HPI  82 yo female never smoker with non small cell cancer, adenocarcinoma dx 42015  Dominant 1.8 cm RUL nodule with small bilateral pulmonary ndoules suspcious for mets. The other subcentimeter nodules and a precarinal lymph node were negative on PET.  Foundation One markers were positive for MET mutation, but negative for EGFR or ALK. She underwent stereotactic radiotherapy to the right upper lobe lung nodule in 04/2014.   She is on furosemide for valvular regurgitation.     04/20/2016  Chief Complaint  Patient presents with  . Cough    Reports increased coughing with production of yellow mucus. Denies chest tightness, wheezing or SOB.    She was treated with steroids for radiation pneumonitis right upper lobe, Z-Pak and Mucinex. She has also tried nonnarcotic cough syrup such as Delsym Cough has been refractory to all about treatments She still has significant mucus production CT chest from 02/2016 was reviewed -wedge shaped consolidation in the right upper lobe and volume loss are stable, related to radiation, small pulmonary nodules are stable and unchanged  Significant tests/ events  Echo in 9/14 showed moderate MR, moderate TR with RVSP of 47.      Review of Systems neg for any significant sore throat, dysphagia, itching, sneezing, nasal congestion or excess/ purulent secretions, fever, chills, sweats, unintended wt loss, pleuritic or exertional cp, hempoptysis, orthopnea pnd or change in chronic leg swelling. Also denies presyncope, palpitations, heartburn, abdominal pain, nausea, vomiting, diarrhea or change in bowel or urinary habits, dysuria,hematuria, rash, arthralgias, visual complaints, headache, numbness weakness or ataxia.     Objective:   Physical Exam  Gen. Pleasant, thin, frail in no distress ENT - no lesions, no post nasal drip Neck: No JVD, no  thyromegaly, no carotid bruits Lungs: no use of accessory muscles, no dullness to percussion, clear without rales or rhonchi  Cardiovascular: Rhythm regular, heart sounds  normal, ESM 2/6 at base, no gallops, no peripheral edema Musculoskeletal: No deformities, no cyanosis or clubbing , ambulates with cane       Assessment & Plan:

## 2016-05-18 ENCOUNTER — Telehealth: Payer: Self-pay | Admitting: *Deleted

## 2016-05-18 NOTE — Telephone Encounter (Signed)
Dr gill is asking, based on the current guidelines, does the pt need SBE for dental procedure. Per dr Stanford Breed, the pt does not require SBE. Called and spoke with dr Ferd Glassing office and they are aware.

## 2016-05-30 ENCOUNTER — Ambulatory Visit: Payer: Medicare Other | Admitting: Pulmonary Disease

## 2016-06-21 DIAGNOSIS — R7301 Impaired fasting glucose: Secondary | ICD-10-CM | POA: Diagnosis not present

## 2016-06-21 DIAGNOSIS — I509 Heart failure, unspecified: Secondary | ICD-10-CM | POA: Diagnosis not present

## 2016-06-21 DIAGNOSIS — Z6820 Body mass index (BMI) 20.0-20.9, adult: Secondary | ICD-10-CM | POA: Diagnosis not present

## 2016-06-21 DIAGNOSIS — Z1389 Encounter for screening for other disorder: Secondary | ICD-10-CM | POA: Diagnosis not present

## 2016-06-21 DIAGNOSIS — C3490 Malignant neoplasm of unspecified part of unspecified bronchus or lung: Secondary | ICD-10-CM | POA: Diagnosis not present

## 2016-06-21 DIAGNOSIS — M810 Age-related osteoporosis without current pathological fracture: Secondary | ICD-10-CM | POA: Diagnosis not present

## 2016-08-16 DIAGNOSIS — K130 Diseases of lips: Secondary | ICD-10-CM | POA: Diagnosis not present

## 2016-09-01 DIAGNOSIS — R5383 Other fatigue: Secondary | ICD-10-CM | POA: Diagnosis not present

## 2016-09-01 DIAGNOSIS — R197 Diarrhea, unspecified: Secondary | ICD-10-CM | POA: Diagnosis not present

## 2016-09-01 DIAGNOSIS — Z6820 Body mass index (BMI) 20.0-20.9, adult: Secondary | ICD-10-CM | POA: Diagnosis not present

## 2016-09-01 DIAGNOSIS — R413 Other amnesia: Secondary | ICD-10-CM | POA: Diagnosis not present

## 2016-09-05 ENCOUNTER — Ambulatory Visit (INDEPENDENT_AMBULATORY_CARE_PROVIDER_SITE_OTHER)
Admission: RE | Admit: 2016-09-05 | Discharge: 2016-09-05 | Disposition: A | Discharge status: Home / Self Care | Payer: Medicare Other | Source: Ambulatory Visit | Attending: Internal Medicine | Admitting: Internal Medicine

## 2016-09-05 ENCOUNTER — Ambulatory Visit (INDEPENDENT_AMBULATORY_CARE_PROVIDER_SITE_OTHER): Payer: Medicare Other | Admitting: Internal Medicine

## 2016-09-05 ENCOUNTER — Encounter: Payer: Self-pay | Admitting: Internal Medicine

## 2016-09-05 VITALS — BP 118/70 | HR 107 | Ht 65.0 in | Wt 117.2 lb

## 2016-09-05 DIAGNOSIS — J45909 Unspecified asthma, uncomplicated: Secondary | ICD-10-CM | POA: Diagnosis not present

## 2016-09-05 DIAGNOSIS — I10 Essential (primary) hypertension: Secondary | ICD-10-CM

## 2016-09-05 DIAGNOSIS — R05 Cough: Secondary | ICD-10-CM | POA: Diagnosis not present

## 2016-09-05 DIAGNOSIS — R0602 Shortness of breath: Secondary | ICD-10-CM | POA: Diagnosis not present

## 2016-09-05 MED ORDER — PREDNISONE 10 MG PO TABS: ORAL_TABLET | ORAL | 0 refills | Status: DC

## 2016-09-05 MED ORDER — BISOPROLOL FUMARATE 5 MG PO TABS: 5.0000 mg | ORAL_TABLET | Freq: Every day | ORAL | 11 refills | Status: DC

## 2016-09-05 MED ORDER — AZITHROMYCIN 250 MG PO TABS: ORAL_TABLET | ORAL | 0 refills | Status: DC

## 2016-09-05 NOTE — Progress Notes (Signed)
Subjective:    Patient ID: Michelle Levine, female    DOB: 1922/05/03, 82 y.o.   MRN: [id number]    Brief patient profile:  82 yo female never smoker with non small cell cancer, adenocarcinoma dx 42015  Dominant 1.8 cm RUL nodule with small bilateral pulmonary ndoules suspcious for mets. The other subcentimeter nodules and a precarinal lymph node were negative on PET.  Foundation One markers were positive for MET mutation, but negative for EGFR or ALK. She underwent stereotactic radiotherapy to the right upper lobe lung nodule in 04/2014.   She is on furosemide for valvular regurgitation.     04/20/2016 Dr Elsworth Soho  Chief Complaint  Patient presents with  . Cough    Reports increased coughing with production of yellow mucus. Denies chest tightness, wheezing or SOB.  She was treated with steroids for radiation pneumonitis right upper lobe, Z-Pak and Mucinex. She has also tried nonnarcotic cough syrup such as Delsym Cough has been refractory to all about treatments She still has significant mucus production CT chest from 02/2016 was reviewed -wedge shaped consolidation in the right upper lobe and volume loss are stable, related to radiation, small pulmonary nodules are stable and unchanged rec Trial of benzonatate 200 mg thrice daily as needed for cough # 45 We can use hydrocodone cough syrup if worse-I am concerned about your balance with this    09/05/2016 acute extended ov/Esa Raden re: refractory cough on corgard Chief Complaint  Patient presents with  . Acute Visit    RA pt. pt c/o prod cough with yellow mucus, increased sob & occ wheezing X3wk pt denies any fever, chills or sweats.   Cough never resolved now worse x 3 weeks 24/7 assoc with sob and sense of wheezing but no sob at rest sitting   No obvious day to day or daytime variability or assoc  mucus plugs or hemoptysis or cp or chest tightness, subjective wheeze or overt sinus or hb symptoms. No unusual exp hx or h/o  childhood pna/ asthma or knowledge of premature birth.  Sleeping ok without nocturnal  or early am exacerbation  of respiratory  c/o's or need for noct saba. Also denies any obvious fluctuation of symptoms with weather or environmental changes or other aggravating or alleviating factors except as outlined above   Current Medications, Allergies, Complete Past Medical History, Past Surgical History, Family History, and Social History were reviewed in [employer] record.  ROS  The following are not active complaints unless bolded sore throat, dysphagia, dental problems, itching, sneezing,  nasal congestion or excess/ purulent secretions, ear ache,   fever, chills, sweats, unintended wt loss, classically pleuritic or exertional cp,  orthopnea pnd or leg swelling, presyncope, palpitations, abdominal pain, anorexia, nausea, vomiting, diarrhea  or change in bowel or bladder habits, change in stools or urine, dysuria,hematuria,  rash, arthralgias, visual complaints, headache, numbness, weakness or ataxia or problems with walking or coordination,  change in mood/affect or memory.         Significant tests/ events  Echo in 9/14 showed moderate MR, moderate TR with RVSP of 47.          Objective:   Physical Exam   amb pleasant wf < stated age   Wt Readings from Last 3 Encounters:  09/05/16 117 lb 3.2 oz (53.2 kg)  04/20/16 128 lb (58.1 kg)  02/24/16 121 lb 8 oz (55.1 kg)    Vital signs reviewed - note pulse 107  And - Note  on arrival 02 sats  99% on RA     HEENT: nl dentition, turbinates, and oropharynx. Nl external ear canals without cough reflex   NECK :  without JVD/Nodes/TM/ nl carotid upstrokes bilaterally   LUNGS: no acc muscle use,  Nl contour chest with bilateral mid exp rhonchi with cough on exp    CV:  RRR  no s3 or murmur or increase in P2, no edema   ABD:  soft and nontender with nl inspiratory excursion in the supine position. No bruits or  organomegaly, bowel sounds nl  MS:  Nl gait/ ext warm without deformities, calf tenderness, cyanosis or clubbing No obvious joint restrictions   SKIN: warm and dry without lesions    NEURO:  alert, approp, nl sensorium with  no motor deficits     CXR PA and Lateral:   09/05/2016 :    I personally reviewed images and agree with radiology impression as follows:    Persistent right upper lobe prominent densities, no definite from CT 02/19/2016 and consistent with scarring and/or radiation change. Continued follow-up CTs can be obtained for adequate comparison to prior CTs.       Assessment & Plan:

## 2016-09-05 NOTE — Patient Instructions (Addendum)
Stop the corgard (nadolol) and replace it with bisoprolol 5 mg daily    zpak  Prednisone 10 mg take  4 each am x 2 days,   2 each am x 2 days,  1 each am x 2 days and stop   For cough>> take up to  mucinex dm 1200 mg twice daily   Please remember to go to the   x-ray department downstairs for your tests - we will call you with the results when they are available.  Return to see Dr Elsworth Soho or our NP if not better in 2 weeks / if better just keep your appt with Tammy but bring all medications with you to the next visit to be sure we are still on the same page with all your medications

## 2016-09-05 NOTE — Progress Notes (Signed)
Spoke with pt and notified of results per Dr. Wert. Pt verbalized understanding and denied any questions. 

## 2016-09-06 ENCOUNTER — Telehealth: Payer: Self-pay | Admitting: Internal Medicine

## 2016-09-06 NOTE — Telephone Encounter (Signed)
Ok to try off zpak and see if resolves, if not see PC

## 2016-09-06 NOTE — Telephone Encounter (Signed)
Pt was started on Zebeta, prednisone & zithromax at her OV with MW on 09-05-16. Pt states she took all three medications yesterday and woke up this morning with diarrhea and dizziness. Pt is concerned that is caused from one of these medications.  MW please advise. Thanks.

## 2016-09-06 NOTE — Telephone Encounter (Signed)
Called and spoke with pt and  She is aware of MW recs. Nothing further is needed.

## 2016-09-09 NOTE — Assessment & Plan Note (Signed)
Strongly prefer in this setting: Bystolic, the most beta -1  selective Beta blocker available in sample form, with bisoprolol the most selective generic choice  on the market.   Try bisoprolol 2.5 mg daily > f/u in 2 weeks

## 2016-09-09 NOTE — Assessment & Plan Note (Signed)
DDX of  difficult airways management almost all start with A and  include Adherence, Ace Inhibitors, Acid Reflux, Active Sinus Disease, Alpha 1 Antitripsin deficiency, Anxiety masquerading as Airways dz,  ABPA,  Allergy(esp in young), Aspiration (esp in elderly), Adverse effects of meds,  Active smokers, A bunch of PE's (a small clot burden can't cause this syndrome unless there is already severe underlying pulm or vascular dz with poor reserve) plus two Bs  = Bronchiectasis and Beta blocker use..and one C= CHF  Adherence is always the initial "prime suspect" and is a multilayered concern that requires a "trust but verify" approach in every patient - starting with knowing how to use medications, especially inhalers, correctly, keeping up with refills and understanding the fundamental difference between maintenance and prns vs those medications only taken for a very short course and then stopped and not refilled.   ? Allergic component > Prednisone 10 mg take  4 each am x 2 days,   2 each am x 2 days,  1 each am x 2 days and stop   ? Sinusitis/ bronchitis> doubt/ ok for zpak only  ?BB effects >  Def has rhonchi on exam and needs trial off corgand before additional measures considered - see hbp  In meantime rec try to control cough with mucinex dm max dose  I had an extended discussion with the patient reviewing all relevant studies completed to date and  lasting   to 25 minutes of a 40  minute acute visit pt prev unknown to me    Each maintenance medication was reviewed in detail including most importantly the difference between maintenance and prns and under what circumstances the prns are to be triggered using an action plan format that is not reflected in the computer generated alphabetically organized AVS.    Please see instructions for details which were reviewed in writing and the patient given a copy highlighting the part that I personally wrote and discussed at today's ov.

## 2016-09-19 ENCOUNTER — Encounter: Payer: Self-pay | Admitting: Adult Health

## 2016-09-19 ENCOUNTER — Ambulatory Visit (INDEPENDENT_AMBULATORY_CARE_PROVIDER_SITE_OTHER): Payer: Medicare Other | Admitting: Adult Health

## 2016-09-19 DIAGNOSIS — C3491 Malignant neoplasm of unspecified part of right bronchus or lung: Secondary | ICD-10-CM

## 2016-09-19 DIAGNOSIS — R05 Cough: Secondary | ICD-10-CM

## 2016-09-19 DIAGNOSIS — R053 Chronic cough: Secondary | ICD-10-CM

## 2016-09-19 NOTE — Progress Notes (Signed)
Subjective:    Patient ID: Michelle Levine, female    DOB: 06-Nov-1922, 82 y.o.   MRN: [id number]  HPI 82 yo female never smoker with non small cell cancer, adenocarcinoma dx 42015  Dominant 1.8 cm RUL nodule with small bilateral pulmonary ndoules suspcious for mets. S/p XRT to RUL nodule in 04/2014   TEST  Echo in 9/14 showed moderate MR, moderate TR with RVSP of 47.  08/2015 CT chest >Progressive volume loss and wedge-shaped subpleural fibrosis/atelectasis posteriorly in the right upper lobe, attributed to prior radiation therapy.  2. No evidence of local recurrence or metastatic disease.   CT chest 02/2016 >stable , no evidence of local recurrence or mets, right apical wedge shaped consolidation attributed to XRT , unchanged scattered lung nodules.    09/19/2016 Follow up: Chronic Cough /Lung cancer / Pt returns for 6 week follow up .  Seen last ov with cough /bronchitis flare . Tx w/ zpack and steroid taper.  Nadolol was changed to bisoprolol.  She is feeling much better. Cough is decreased a lot . Has minimal mucus production   Tolerating bisoprolol . B/p today is good.  CXR last ov w/ no change in chronic changes.     Past Medical History:  Diagnosis Date  . Aortic insufficiency   . CAD (coronary artery disease)   . Cataracts, bilateral   . CHF (congestive heart failure) ([place])   . COPD (chronic obstructive pulmonary disease) ([place])   . Hypertension   . Mitral insufficiency   . Pulmonary hypertension   . Thyroid disease   . Tricuspid regurgitation    Current Outpatient Prescriptions on File Prior to Visit  Medication Sig Dispense Refill  . amLODipine (NORVASC) 5 MG tablet Take 5 mg by mouth daily.    . bisoprolol (ZEBETA) 5 MG tablet Take 1 tablet (5 mg total) by mouth daily. 30 tablet 11  . Calcium Carbonate-Vitamin D (CALCIUM + D PO) Take 1 capsule by mouth daily.     . citalopram (CELEXA) 20 MG tablet Take 20 mg by mouth daily.     . furosemide (LASIX) 40 MG tablet  Take 40 mg by mouth daily.     Marland Kitchen KLOR-CON M10 10 MEQ tablet Take 10 mEq by mouth daily.    . methimazole (TAPAZOLE) 10 MG tablet Take 5 mg by mouth daily.     . Multiple Vitamin (MULITIVITAMIN WITH MINERALS) TABS Take 1 tablet by mouth daily.     . zoledronic acid (RECLAST) 5 MG/100ML SOLN Inject 5 mg into the vein. Reported on 11/30/2015    . aspirin EC 81 MG tablet Take 81 mg by mouth 3 (three) times a week. Monday, Wednesday and Friday    . benzonatate (TESSALON) 200 MG capsule Take 1 capsule (200 mg total) by mouth 3 (three) times daily as needed for cough. (Patient not taking: Reported on 09/19/2016) 45 capsule 0   No current facility-administered medications on file prior to visit.      Review of Systems Constitutional:   No  weight loss, night sweats,  Fevers, chills, fatigue, or  lassitude.  HEENT:   No headaches,  Difficulty swallowing,  Tooth/dental problems, or  Sore throat,                No sneezing, itching, ear ache, nasal congestion, post nasal drip,   CV:  No chest pain,  Orthopnea, PND, swelling in lower extremities, anasarca, dizziness, palpitations, syncope.   GI  No heartburn, indigestion, abdominal  pain, nausea, vomiting, diarrhea, change in bowel habits, loss of appetite, bloody stools.   Resp: No shortness of breath with exertion or at rest.  No excess mucus, no productive cough,  No non-productive cough,  No coughing up of blood.  No change in color of mucus.  No wheezing.  No chest wall deformity  Skin: no rash or lesions.  GU: no dysuria, change in color of urine, no urgency or frequency.  No flank pain, no hematuria   MS:  No joint pain or swelling.  No decreased range of motion.  No back pain.  Psych:  No change in mood or affect. No depression or anxiety.  No memory loss.         Objective:   Physical Exam  Vitals:   09/19/16 1003  BP: 110/64  Pulse: 98  Temp: 98.9 F (37.2 C)  TempSrc: Oral  SpO2: 96%  Weight: 120 lb (54.4 kg)  Height: 5'  5" (1.651 m)    GEN: A/Ox3; pleasant , NAD, elderly    HEENT:  [place]/AT,  EACs-clear, TMs-wnl, NOSE-clear, THROAT-clear, no lesions, no postnasal drip or exudate noted.   NECK:  Supple w/ fair ROM; no JVD; normal carotid impulses w/o bruits; no thyromegaly or nodules palpated; no lymphadenopathy.    RESP  Clear  P & A; w/o, wheezes/ rales/ or rhonchi. no accessory muscle use, no dullness to percussion  CARD:  RRR, no m/r/g  , no peripheral edema, pulses intact, no cyanosis or clubbing.  GI:   Soft & nt; nml bowel sounds; no organomegaly or masses detected.   Musco: Warm bil, no deformities or joint swelling noted.   Neuro: alert, no focal deficits noted.    Skin: Warm, no lesions or rashes       Tammy Parrett NP-C  [facility]  09/19/2016

## 2016-09-19 NOTE — Patient Instructions (Signed)
May use Mucinex DM Twice daily  As needed  Cough Follow up Dr. Elsworth Soho  In 3-4  months and As needed

## 2016-09-19 NOTE — Assessment & Plan Note (Signed)
S/p XRT . Doing well with serial CT showing no evidence of recurrence /mets (last 02/2016)

## 2016-09-19 NOTE — Assessment & Plan Note (Signed)
Recent flare with bronchitis , doing well after abx /steroids   Plan  Patient Instructions  May use Mucinex DM Twice daily  As needed  Cough Follow up Dr. Elsworth Soho  In 3-4  months and As needed

## 2016-09-21 NOTE — Progress Notes (Signed)
Reviewed & agree with plan  

## 2016-09-27 DIAGNOSIS — H2511 Age-related nuclear cataract, right eye: Secondary | ICD-10-CM | POA: Diagnosis not present

## 2016-09-27 DIAGNOSIS — H401213 Low-tension glaucoma, right eye, severe stage: Secondary | ICD-10-CM | POA: Diagnosis not present

## 2016-09-27 DIAGNOSIS — H524 Presbyopia: Secondary | ICD-10-CM | POA: Diagnosis not present

## 2016-09-27 DIAGNOSIS — H401223 Low-tension glaucoma, left eye, severe stage: Secondary | ICD-10-CM | POA: Diagnosis not present

## 2016-10-07 ENCOUNTER — Emergency Department (HOSPITAL_COMMUNITY): Payer: Medicare Other

## 2016-10-07 ENCOUNTER — Inpatient Hospital Stay (HOSPITAL_COMMUNITY)
Admission: EM | Admit: 2016-10-07 | Discharge: 2016-10-08 | DRG: 183 | Disposition: A | Discharge status: Home / Self Care | Payer: Medicare Other | Attending: Nephrology | Admitting: Nephrology

## 2016-10-07 ENCOUNTER — Encounter (HOSPITAL_COMMUNITY): Payer: Self-pay | Admitting: *Deleted

## 2016-10-07 DIAGNOSIS — R0602 Shortness of breath: Secondary | ICD-10-CM | POA: Diagnosis not present

## 2016-10-07 DIAGNOSIS — Z7982 Long term (current) use of aspirin: Secondary | ICD-10-CM | POA: Diagnosis not present

## 2016-10-07 DIAGNOSIS — N939 Abnormal uterine and vaginal bleeding, unspecified: Secondary | ICD-10-CM | POA: Diagnosis not present

## 2016-10-07 DIAGNOSIS — E042 Nontoxic multinodular goiter: Secondary | ICD-10-CM | POA: Diagnosis present

## 2016-10-07 DIAGNOSIS — J9601 Acute respiratory failure with hypoxia: Secondary | ICD-10-CM | POA: Diagnosis present

## 2016-10-07 DIAGNOSIS — R0789 Other chest pain: Secondary | ICD-10-CM

## 2016-10-07 DIAGNOSIS — Z85118 Personal history of other malignant neoplasm of bronchus and lung: Secondary | ICD-10-CM

## 2016-10-07 DIAGNOSIS — Y92414 Local residential or business street as the place of occurrence of the external cause: Secondary | ICD-10-CM | POA: Diagnosis not present

## 2016-10-07 DIAGNOSIS — I272 Pulmonary hypertension, unspecified: Secondary | ICD-10-CM | POA: Diagnosis present

## 2016-10-07 DIAGNOSIS — S2220XA Unspecified fracture of sternum, initial encounter for closed fracture: Secondary | ICD-10-CM | POA: Diagnosis not present

## 2016-10-07 DIAGNOSIS — Z806 Family history of leukemia: Secondary | ICD-10-CM | POA: Diagnosis not present

## 2016-10-07 DIAGNOSIS — Z79899 Other long term (current) drug therapy: Secondary | ICD-10-CM

## 2016-10-07 DIAGNOSIS — I2510 Atherosclerotic heart disease of native coronary artery without angina pectoris: Secondary | ICD-10-CM | POA: Diagnosis present

## 2016-10-07 DIAGNOSIS — R079 Chest pain, unspecified: Secondary | ICD-10-CM | POA: Diagnosis not present

## 2016-10-07 DIAGNOSIS — I110 Hypertensive heart disease with heart failure: Secondary | ICD-10-CM | POA: Diagnosis present

## 2016-10-07 DIAGNOSIS — Z85038 Personal history of other malignant neoplasm of large intestine: Secondary | ICD-10-CM | POA: Diagnosis not present

## 2016-10-07 DIAGNOSIS — J449 Chronic obstructive pulmonary disease, unspecified: Secondary | ICD-10-CM | POA: Diagnosis present

## 2016-10-07 DIAGNOSIS — I509 Heart failure, unspecified: Secondary | ICD-10-CM | POA: Diagnosis present

## 2016-10-07 DIAGNOSIS — Z8249 Family history of ischemic heart disease and other diseases of the circulatory system: Secondary | ICD-10-CM | POA: Diagnosis not present

## 2016-10-07 DIAGNOSIS — O009 Unspecified ectopic pregnancy without intrauterine pregnancy: Secondary | ICD-10-CM

## 2016-10-07 HISTORY — DX: Malignant (primary) neoplasm, unspecified: C80.1

## 2016-10-07 LAB — CBC
HEMATOCRIT: 41.3 % (ref 36.0–46.0)
HEMOGLOBIN: 13.3 g/dL (ref 12.0–15.0)
MCH: 29.2 pg (ref 26.0–34.0)
MCHC: 32.2 g/dL (ref 30.0–36.0)
MCV: 90.6 fL (ref 78.0–100.0)
Platelets: 232 10*3/uL (ref 150–400)
RBC: 4.56 MIL/uL (ref 3.87–5.11)
RDW: 14.5 % (ref 11.5–15.5)
WBC: 7.9 10*3/uL (ref 4.0–10.5)

## 2016-10-07 LAB — BASIC METABOLIC PANEL WITH GFR
Anion gap: 8 (ref 5–15)
BUN: 16 mg/dL (ref 6–20)
CO2: 24 mmol/L (ref 22–32)
Calcium: 9.3 mg/dL (ref 8.9–10.3)
Chloride: 105 mmol/L (ref 101–111)
Creatinine, Ser: 0.83 mg/dL (ref 0.44–1.00)
GFR calc Af Amer: >60 mL/min
GFR calc non Af Amer: 59 mL/min — ABNORMAL LOW
Glucose, Bld: 115 mg/dL — ABNORMAL HIGH (ref 65–99)
Potassium: 3.7 mmol/L (ref 3.5–5.1)
Sodium: 137 mmol/L (ref 135–145)

## 2016-10-07 LAB — I-STAT TROPONIN, ED
Troponin i, poc: 0 ng/mL (ref 0.00–0.08)
Troponin i, poc: 0.01 ng/mL (ref 0.00–0.08)

## 2016-10-07 MED ORDER — HYDROCODONE-ACETAMINOPHEN 5-325 MG PO TABS: 1.0000 | ORAL_TABLET | Freq: Four times a day (QID) | ORAL | 0 refills | Status: DC | PRN

## 2016-10-07 MED ORDER — CITALOPRAM HYDROBROMIDE 20 MG PO TABS
20.0000 mg | ORAL_TABLET | Freq: Every day | ORAL | Status: DC
  Administered 2016-10-08: 20 mg via ORAL
  Filled 2016-10-07: qty 1

## 2016-10-07 MED ORDER — CITALOPRAM HYDROBROMIDE 10 MG PO TABS: 20.0000 mg | ORAL_TABLET | Freq: Every day | ORAL | Status: DC

## 2016-10-07 MED ORDER — BISOPROLOL FUMARATE 5 MG PO TABS
5.0000 mg | ORAL_TABLET | Freq: Every day | ORAL | Status: DC
  Administered 2016-10-08: 5 mg via ORAL
  Filled 2016-10-07: qty 1

## 2016-10-07 MED ORDER — METHIMAZOLE 5 MG PO TABS: 5.0000 mg | ORAL_TABLET | Freq: Every day | ORAL | Status: DC

## 2016-10-07 MED ORDER — BENZONATATE 100 MG PO CAPS
200.0000 mg | ORAL_CAPSULE | Freq: Three times a day (TID) | ORAL | Status: DC | PRN
Start: 2016-10-07 — End: 2016-10-07

## 2016-10-07 MED ORDER — ALBUTEROL SULFATE (2.5 MG/3ML) 0.083% IN NEBU
5.0000 mg | INHALATION_SOLUTION | Freq: Once | RESPIRATORY_TRACT | Status: AC
  Administered 2016-10-07: 5 mg via RESPIRATORY_TRACT
  Filled 2016-10-07: qty 6

## 2016-10-07 MED ORDER — BISOPROLOL FUMARATE 5 MG PO TABS: 5.0000 mg | ORAL_TABLET | Freq: Every day | ORAL | Status: DC

## 2016-10-07 MED ORDER — BENZONATATE 100 MG PO CAPS: 200.0000 mg | ORAL_CAPSULE | Freq: Three times a day (TID) | ORAL | Status: DC | PRN

## 2016-10-07 MED ORDER — METHIMAZOLE 5 MG PO TABS
5.0000 mg | ORAL_TABLET | Freq: Every day | ORAL | Status: DC
  Administered 2016-10-08: 5 mg via ORAL
  Filled 2016-10-07: qty 1

## 2016-10-07 MED ORDER — ASPIRIN EC 81 MG PO TBEC: 81.0000 mg | DELAYED_RELEASE_TABLET | ORAL | Status: DC

## 2016-10-07 MED ORDER — ADULT MULTIVITAMIN W/MINERALS CH
1.0000 | ORAL_TABLET | Freq: Every day | ORAL | Status: DC
  Administered 2016-10-08: 1 via ORAL
  Filled 2016-10-07: qty 1

## 2016-10-07 MED ORDER — HYDROCODONE-ACETAMINOPHEN 5-325 MG PO TABS: 1.0000 | ORAL_TABLET | Freq: Four times a day (QID) | ORAL | Status: DC | PRN

## 2016-10-07 MED ORDER — AMLODIPINE BESYLATE 5 MG PO TABS
5.0000 mg | ORAL_TABLET | Freq: Every day | ORAL | Status: DC
  Administered 2016-10-08: 5 mg via ORAL
  Filled 2016-10-07: qty 1

## 2016-10-07 MED ORDER — IOPAMIDOL (ISOVUE-300) INJECTION 61%
INTRAVENOUS | Status: AC
  Administered 2016-10-07: 75 mL
  Filled 2016-10-07: qty 75

## 2016-10-07 MED ORDER — HYDROCODONE-ACETAMINOPHEN 5-325 MG PO TABS
1.0000 | ORAL_TABLET | Freq: Once | ORAL | Status: AC
  Administered 2016-10-07: 1 via ORAL
  Filled 2016-10-07: qty 1

## 2016-10-07 MED ORDER — POTASSIUM CHLORIDE CRYS ER 10 MEQ PO TBCR: 10.0000 meq | EXTENDED_RELEASE_TABLET | Freq: Every day | ORAL | Status: DC

## 2016-10-07 MED ORDER — AMLODIPINE BESYLATE 5 MG PO TABS: 5.0000 mg | ORAL_TABLET | Freq: Every day | ORAL | Status: DC

## 2016-10-07 MED ORDER — POTASSIUM CHLORIDE CRYS ER 10 MEQ PO TBCR
10.0000 meq | EXTENDED_RELEASE_TABLET | Freq: Every day | ORAL | Status: DC
  Administered 2016-10-08: 10 meq via ORAL
  Filled 2016-10-07: qty 1

## 2016-10-07 MED ORDER — FUROSEMIDE 20 MG PO TABS: 40.0000 mg | ORAL_TABLET | Freq: Every day | ORAL | Status: DC

## 2016-10-07 MED ORDER — FUROSEMIDE 40 MG PO TABS
40.0000 mg | ORAL_TABLET | Freq: Every day | ORAL | Status: DC
  Administered 2016-10-08: 40 mg via ORAL
  Filled 2016-10-07: qty 1

## 2016-10-07 NOTE — ED Notes (Signed)
ED Provider at bedside. 

## 2016-10-07 NOTE — ED Notes (Signed)
Ambulated Pt on RM Air. Pt started in the bed at 92% on RM Air. Pt's O2 Sat dropped as low as 73%, but stayed between 73%-78% while walking in the hallway. Once back in the RM hooked Pt back to the Monitor. O2 Sat was 83% with good wave form. Hooked Pt back up to 2L O2. Pt stated she did not feel short of breath or dizzy at all while walking.

## 2016-10-07 NOTE — ED Notes (Signed)
Patient transported to X-ray 
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
## 2016-10-07 NOTE — ED Notes (Signed)
Incentive spirometer given to pt; education on use provided to pt and family.

## 2016-10-07 NOTE — ED Notes (Signed)
Pt sats 87%; placed on 0.5 L [place]. Sats now 92%. Pt denies SOB at this time. Resp e/u.

## 2016-10-07 NOTE — Discharge Instructions (Signed)
Return to the ED with any concerns including difficulty breathing, worsening chest pain, leg swelling, fainting, nausea/vomiting, decreased level of alertness/lethargy, or any other alarming symptoms

## 2016-10-07 NOTE — ED Notes (Addendum)
Pt sats 87% on RA; placed on 2L [place]; EDP made aware.

## 2016-10-07 NOTE — ED Notes (Signed)
EDP notified of low oxygen saturation while ambulating.
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
## 2016-10-07 NOTE — ED Notes (Signed)
Pt placed on 2L for sats at 90%

## 2016-10-08 ENCOUNTER — Encounter (HOSPITAL_COMMUNITY): Payer: Self-pay

## 2016-10-08 DIAGNOSIS — J9601 Acute respiratory failure with hypoxia: Secondary | ICD-10-CM

## 2016-10-08 DIAGNOSIS — S2220XA Unspecified fracture of sternum, initial encounter for closed fracture: Principal | ICD-10-CM

## 2016-10-08 NOTE — Progress Notes (Signed)
NURSING PROGRESS NOTE  Michelle Levine [id number] Discharge Data: 10/08/2016 1:29 PM Attending Provider: Rosita Fire, MD PTW:SFKCLE,XNTZ A, MD   [name] to be D/C'd Home  per MD order.    All IV's will be discontinued and monitored for bleeding.  All belongings will be returned to patient for patient to take home.  Last Documented Vital Signs:  Blood pressure 139/79, pulse 78, temperature 98.2 F (36.8 C), resp. rate 19, height '5\' 5"'$  (1.651 m), weight 54.4 kg (120 lb), SpO2 93 %.  Joslyn Hy, MSN, RN, [employer]
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
## 2016-10-08 NOTE — Progress Notes (Signed)
SATURATION QUALIFICATIONS: (This note is used to comply with regulatory documentation for home oxygen)  Patient Saturations on Room Air at Rest = 92%  Patient Saturations on Room Air while Ambulating = 91%    Please briefly explain why patient needs home oxygen: Patient does not qualify for oxygen

## 2016-10-17 DIAGNOSIS — I10 Essential (primary) hypertension: Secondary | ICD-10-CM | POA: Diagnosis not present

## 2016-10-18 ENCOUNTER — Ambulatory Visit: Payer: Medicare Other | Admitting: Adult Health

## 2016-10-25 ENCOUNTER — Ambulatory Visit: Payer: Medicare Other | Admitting: Adult Health

## 2016-11-06 DIAGNOSIS — J449 Chronic obstructive pulmonary disease, unspecified: Secondary | ICD-10-CM | POA: Diagnosis not present

## 2016-11-06 DIAGNOSIS — I509 Heart failure, unspecified: Secondary | ICD-10-CM | POA: Diagnosis not present

## 2016-11-06 DIAGNOSIS — Z6820 Body mass index (BMI) 20.0-20.9, adult: Secondary | ICD-10-CM | POA: Diagnosis not present

## 2016-11-06 DIAGNOSIS — S2220XD Unspecified fracture of sternum, subsequent encounter for fracture with routine healing: Secondary | ICD-10-CM | POA: Diagnosis not present

## 2016-11-06 DIAGNOSIS — I10 Essential (primary) hypertension: Secondary | ICD-10-CM | POA: Diagnosis not present

## 2016-12-01 DIAGNOSIS — J209 Acute bronchitis, unspecified: Secondary | ICD-10-CM | POA: Diagnosis not present

## 2016-12-01 DIAGNOSIS — R05 Cough: Secondary | ICD-10-CM | POA: Diagnosis not present

## 2016-12-01 DIAGNOSIS — J449 Chronic obstructive pulmonary disease, unspecified: Secondary | ICD-10-CM | POA: Diagnosis not present
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
## 2016-12-12 ENCOUNTER — Ambulatory Visit (INDEPENDENT_AMBULATORY_CARE_PROVIDER_SITE_OTHER): Payer: Medicare Other | Admitting: Cardiology

## 2016-12-12 ENCOUNTER — Encounter: Payer: Self-pay | Admitting: Cardiology

## 2016-12-12 VITALS — BP 114/70 | HR 91 | Ht 65.0 in | Wt 118.0 lb

## 2016-12-12 DIAGNOSIS — I10 Essential (primary) hypertension: Secondary | ICD-10-CM

## 2016-12-12 DIAGNOSIS — I351 Nonrheumatic aortic (valve) insufficiency: Secondary | ICD-10-CM | POA: Diagnosis not present

## 2016-12-12 NOTE — Patient Instructions (Signed)
Your physician wants you to follow-up in: ONE YEAR WITH DR CRENSHAW You will receive a reminder letter in the mail two months in advance. If you don't receive a letter, please call our office to schedule the follow-up appointment.   If you need a refill on your cardiac medications before your next appointment, please call your pharmacy.  

## 2016-12-13 DIAGNOSIS — R05 Cough: Secondary | ICD-10-CM | POA: Diagnosis not present

## 2016-12-13 DIAGNOSIS — J209 Acute bronchitis, unspecified: Secondary | ICD-10-CM | POA: Diagnosis not present

## 2016-12-13 DIAGNOSIS — J449 Chronic obstructive pulmonary disease, unspecified: Secondary | ICD-10-CM | POA: Diagnosis not present

## 2016-12-13 DIAGNOSIS — Z6820 Body mass index (BMI) 20.0-20.9, adult: Secondary | ICD-10-CM | POA: Diagnosis not present

## 2016-12-20 ENCOUNTER — Encounter: Payer: Self-pay | Admitting: Pulmonary Disease

## 2016-12-20 ENCOUNTER — Ambulatory Visit (INDEPENDENT_AMBULATORY_CARE_PROVIDER_SITE_OTHER): Payer: Medicare Other | Admitting: Pulmonary Disease

## 2016-12-20 DIAGNOSIS — J700 Acute pulmonary manifestations due to radiation: Secondary | ICD-10-CM

## 2016-12-20 DIAGNOSIS — R05 Cough: Secondary | ICD-10-CM

## 2016-12-20 DIAGNOSIS — R918 Other nonspecific abnormal finding of lung field: Secondary | ICD-10-CM

## 2016-12-20 DIAGNOSIS — R053 Chronic cough: Secondary | ICD-10-CM

## 2016-12-20 NOTE — Assessment & Plan Note (Signed)
Stable on follow-up scan
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
## 2016-12-20 NOTE — Assessment & Plan Note (Signed)
Stable on follow-up scan 10/2016

## 2016-12-20 NOTE — Assessment & Plan Note (Signed)
May be related to postnasal drip The cough predates her radiation changes, she has learned to live with this now and this does not bother her on a daily basis.

## 2016-12-20 NOTE — Patient Instructions (Signed)
Call as neeeded

## 2016-12-27 DIAGNOSIS — R7301 Impaired fasting glucose: Secondary | ICD-10-CM | POA: Diagnosis not present

## 2016-12-27 DIAGNOSIS — I10 Essential (primary) hypertension: Secondary | ICD-10-CM | POA: Diagnosis not present

## 2016-12-27 DIAGNOSIS — Z0000 Encounter for general adult medical examination without abnormal findings: Secondary | ICD-10-CM | POA: Diagnosis not present

## 2016-12-27 DIAGNOSIS — E041 Nontoxic single thyroid nodule: Secondary | ICD-10-CM | POA: Diagnosis not present

## 2016-12-27 DIAGNOSIS — M810 Age-related osteoporosis without current pathological fracture: Secondary | ICD-10-CM | POA: Diagnosis not present

## 2017-01-03 DIAGNOSIS — N182 Chronic kidney disease, stage 2 (mild): Secondary | ICD-10-CM | POA: Diagnosis not present

## 2017-01-03 DIAGNOSIS — R5383 Other fatigue: Secondary | ICD-10-CM | POA: Diagnosis not present

## 2017-01-03 DIAGNOSIS — Z1231 Encounter for screening mammogram for malignant neoplasm of breast: Secondary | ICD-10-CM | POA: Diagnosis not present

## 2017-01-03 DIAGNOSIS — C3490 Malignant neoplasm of unspecified part of unspecified bronchus or lung: Secondary | ICD-10-CM | POA: Diagnosis not present

## 2017-01-03 DIAGNOSIS — I509 Heart failure, unspecified: Secondary | ICD-10-CM | POA: Diagnosis not present

## 2017-01-03 DIAGNOSIS — M810 Age-related osteoporosis without current pathological fracture: Secondary | ICD-10-CM | POA: Diagnosis not present

## 2017-01-03 DIAGNOSIS — Z1389 Encounter for screening for other disorder: Secondary | ICD-10-CM | POA: Diagnosis not present

## 2017-01-03 DIAGNOSIS — E0590 Thyrotoxicosis, unspecified without thyrotoxic crisis or storm: Secondary | ICD-10-CM | POA: Diagnosis not present

## 2017-01-03 DIAGNOSIS — Z0000 Encounter for general adult medical examination without abnormal findings: Secondary | ICD-10-CM | POA: Diagnosis not present

## 2017-01-03 DIAGNOSIS — J449 Chronic obstructive pulmonary disease, unspecified: Secondary | ICD-10-CM | POA: Diagnosis not present

## 2017-01-03 DIAGNOSIS — Z6820 Body mass index (BMI) 20.0-20.9, adult: Secondary | ICD-10-CM | POA: Diagnosis not present

## 2017-01-03 DIAGNOSIS — R05 Cough: Secondary | ICD-10-CM | POA: Diagnosis not present

## 2017-02-13 ENCOUNTER — Telehealth: Payer: Self-pay | Admitting: *Deleted

## 2017-02-13 NOTE — Telephone Encounter (Signed)
"  I just received a call about an appointment.  What is the appointment for, who is it with and where do I go?"  This week's and next week's ppointment information provided.  No further questions.

## 2017-02-15 ENCOUNTER — Other Ambulatory Visit (HOSPITAL_BASED_OUTPATIENT_CLINIC_OR_DEPARTMENT_OTHER): Payer: Medicare Other

## 2017-02-15 DIAGNOSIS — C3411 Malignant neoplasm of upper lobe, right bronchus or lung: Secondary | ICD-10-CM

## 2017-02-15 DIAGNOSIS — C3491 Malignant neoplasm of unspecified part of right bronchus or lung: Secondary | ICD-10-CM

## 2017-02-15 LAB — CBC WITH DIFFERENTIAL/PLATELET
BASO%: 1.2 % (ref 0.0–2.0)
BASOS ABS: 0.1 10*3/uL (ref 0.0–0.1)
EOS%: 2.9 % (ref 0.0–7.0)
Eosinophils Absolute: 0.2 10*3/uL (ref 0.0–0.5)
HCT: 40.6 % (ref 34.8–46.6)
HEMOGLOBIN: 13.6 g/dL (ref 11.6–15.9)
LYMPH%: 22.5 % (ref 14.0–49.7)
MCH: 29.6 pg (ref 25.1–34.0)
MCHC: 33.4 g/dL (ref 31.5–36.0)
MCV: 88.7 fL (ref 79.5–101.0)
MONO#: 0.8 10*3/uL (ref 0.1–0.9)
MONO%: 10.2 % (ref 0.0–14.0)
NEUT#: 4.8 10*3/uL (ref 1.5–6.5)
NEUT%: 63.2 % (ref 38.4–76.8)
Platelets: 200 10*3/uL (ref 145–400)
RBC: 4.57 10*6/uL (ref 3.70–5.45)
RDW: 13.9 % (ref 11.2–14.5)
WBC: 7.6 10*3/uL (ref 3.9–10.3)
lymph#: 1.7 10*3/uL (ref 0.9–3.3)

## 2017-02-15 LAB — COMPREHENSIVE METABOLIC PANEL
ALT: 19 U/L (ref 0–55)
AST: 29 U/L (ref 5–34)
Albumin: 3.5 g/dL (ref 3.5–5.0)
Alkaline Phosphatase: 74 U/L (ref 40–150)
Anion Gap: 10 mEq/L (ref 3–11)
BUN: 18.9 mg/dL (ref 7.0–26.0)
CHLORIDE: 106 meq/L (ref 98–109)
CO2: 28 meq/L (ref 22–29)
Calcium: 9.7 mg/dL (ref 8.4–10.4)
Creatinine: 0.9 mg/dL (ref 0.6–1.1)
EGFR: 55 mL/min/{1.73_m2} — ABNORMAL LOW (ref >90)
GLUCOSE: 107 mg/dL (ref 70–140)
POTASSIUM: 4.2 meq/L (ref 3.5–5.1)
SODIUM: 143 meq/L (ref 136–145)
Total Bilirubin: 0.56 mg/dL (ref 0.20–1.20)
Total Protein: 6.9 g/dL (ref 6.4–8.3)

## 2017-02-21 ENCOUNTER — Encounter (HOSPITAL_COMMUNITY): Payer: Self-pay

## 2017-02-21 ENCOUNTER — Ambulatory Visit (HOSPITAL_COMMUNITY)
Admission: RE | Admit: 2017-02-21 | Discharge: 2017-02-21 | Disposition: A | Discharge status: Home / Self Care | Payer: Medicare Other | Source: Ambulatory Visit | Attending: Internal Medicine | Admitting: Internal Medicine

## 2017-02-21 DIAGNOSIS — C3491 Malignant neoplasm of unspecified part of right bronchus or lung: Secondary | ICD-10-CM | POA: Diagnosis present

## 2017-02-21 DIAGNOSIS — R918 Other nonspecific abnormal finding of lung field: Secondary | ICD-10-CM | POA: Diagnosis not present

## 2017-02-21 DIAGNOSIS — R590 Localized enlarged lymph nodes: Secondary | ICD-10-CM | POA: Insufficient documentation

## 2017-02-21 MED ORDER — IOPAMIDOL (ISOVUE-300) INJECTION 61%
75.0000 mL | Freq: Once | INTRAVENOUS | Status: AC | PRN
  Administered 2017-02-21: 75 mL via INTRAVENOUS

## 2017-02-21 MED ORDER — IOPAMIDOL (ISOVUE-300) INJECTION 61%
INTRAVENOUS | Status: AC
  Filled 2017-02-21: qty 75

## 2017-02-22 ENCOUNTER — Encounter: Payer: Self-pay | Admitting: Internal Medicine

## 2017-02-22 ENCOUNTER — Ambulatory Visit (HOSPITAL_BASED_OUTPATIENT_CLINIC_OR_DEPARTMENT_OTHER): Payer: Medicare Other | Admitting: Internal Medicine

## 2017-02-22 VITALS — BP 151/86 | HR 94 | Temp 98.1°F | Resp 18 | Ht 65.0 in | Wt 119.9 lb

## 2017-02-22 DIAGNOSIS — C3411 Malignant neoplasm of upper lobe, right bronchus or lung: Secondary | ICD-10-CM | POA: Diagnosis not present

## 2017-02-22 DIAGNOSIS — C3491 Malignant neoplasm of unspecified part of right bronchus or lung: Secondary | ICD-10-CM

## 2017-02-22 NOTE — Progress Notes (Signed)
[facility] [phone]   [phone]  OFFICE PROGRESS NOTE  Jerlyn Ly, MD [place] [address]  DIAGNOSIS: non-small cell lung cancer, adenocarcinoma, with positive MET mutation, with multiple pulmonary nodules but at least a stage IA (T1a, N0, M0) involving the right upper lobe, with other bilateral pulmonary nodule suspicious for metastatic disease or synchronous primaries.  MOLECULAR STUDIES: Foundation One: Positive for MET exon 14 splice site ([id number]>T, TET2 Q 1524*. Negative for EGFR, ALK, RET, BRAF, KRAS, ERBB2.  PRIOR THERAPY: Stereotactic radiotherapy to the right upper lobe lung nodule expected to be completed on 05/06/2014  CURRENT THERAPY: None.  INTERVAL HISTORY: Michelle Levine 83 y.o. female returns to the clinic today for annual follow-up visit. The patient is feeling fine today with no specific complaints. She denied having any chest pain, shortness of breath, cough or hemoptysis. She denied having any fever or chills. She has no nausea, vomiting, diarrhea or constipation. She had repeat CT scan of the chest performed recently and she is here for evaluation and discussion of her scan results.  MEDICAL HISTORY: Past Medical History:  Diagnosis Date  . Aortic insufficiency   . CAD (coronary artery disease)   . Cancer ([place])   . Cataracts, bilateral   . CHF (congestive heart failure) ([place])   . COPD (chronic obstructive pulmonary disease) ([place])   . Hypertension   . Mitral insufficiency   . Pulmonary hypertension ([place])   . Thyroid disease   . Tricuspid regurgitation     ALLERGIES:  has No Known Allergies.  MEDICATIONS:  Current Outpatient Prescriptions  Medication Sig Dispense Refill  . amLODipine (NORVASC) 5 MG tablet Take 5 mg by mouth daily.    . bisoprolol (ZEBETA) 5 MG tablet Take 1 tablet (5 mg total) by mouth daily. 30 tablet 11  . Calcium Carbonate-Vitamin D (CALCIUM + D PO) Take 1 capsule  by mouth daily.     . citalopram (CELEXA) 20 MG tablet Take 20 mg by mouth daily.     . fluticasone (FLONASE) 50 MCG/ACT nasal spray     . furosemide (LASIX) 40 MG tablet Take 40 mg by mouth daily.     . Iodoquinol-HC (HYDROCORTISONE-IODOQUINOL) 1-1 % CREA     . KLOR-CON M10 10 MEQ tablet Take 10 mEq by mouth daily.    . methimazole (TAPAZOLE) 10 MG tablet Take 5 mg by mouth daily.     . Multiple Vitamin (MULITIVITAMIN WITH MINERALS) TABS Take 1 tablet by mouth daily.     . zoledronic acid (RECLAST) 5 MG/100ML SOLN Inject 5 mg into the vein. Reported on 11/30/2015     No current facility-administered medications for this visit.     SURGICAL HISTORY:  Past Surgical History:  Procedure Laterality Date  . ABDOMINAL HYSTERECTOMY    . APPENDECTOMY    . BREAST LUMPECTOMY    . CATARACT EXTRACTION    . EYE SURGERY      REVIEW OF SYSTEMS:  A comprehensive review of systems was negative.   PHYSICAL EXAMINATION: General appearance: alert, cooperative and no distress Head: Normocephalic, without obvious abnormality, atraumatic Neck: no adenopathy, no JVD, supple, symmetrical, trachea midline and thyroid not enlarged, symmetric, no tenderness/mass/nodules Lymph nodes: Cervical, supraclavicular, and axillary nodes normal. Resp: clear to auscultation bilaterally Back: symmetric, no curvature. ROM normal. No CVA tenderness. Cardio: regular rate and rhythm, S1, S2 normal, no murmur, click, rub or gallop GI: soft, non-tender; bowel sounds normal; no  masses,  no organomegaly Extremities: extremities normal, atraumatic, no cyanosis or edema  ECOG PERFORMANCE STATUS: 1 - Symptomatic but completely ambulatory  Blood pressure (!) 151/86, pulse 94, temperature 98.1 F (36.7 C), temperature source Oral, resp. rate 18, height '5\' 5"'$  (1.651 m), weight 119 lb 14.4 oz (54.4 kg), SpO2 94 %.  LABORATORY DATA: Lab Results  Component Value Date   WBC 7.6 02/15/2017   HGB 13.6 02/15/2017   HCT 40.6  02/15/2017   MCV 88.7 02/15/2017   PLT 200 02/15/2017      Chemistry      Component Value Date/Time   NA 143 02/15/2017 0941   K 4.2 02/15/2017 0941   CL 105 10/07/2016 1048   CO2 28 02/15/2017 0941   BUN 18.9 02/15/2017 0941   CREATININE 0.9 02/15/2017 0941      Component Value Date/Time   CALCIUM 9.7 02/15/2017 0941   ALKPHOS 74 02/15/2017 0941   AST 29 02/15/2017 0941   ALT 19 02/15/2017 0941   BILITOT 0.56 02/15/2017 0941       RADIOGRAPHIC STUDIES: Ct Chest W Contrast  Result Date: 02/21/2017 CLINICAL DATA:  Followup right lung carcinoma. Previous radiation therapy. EXAM: CT CHEST WITH CONTRAST TECHNIQUE: Multidetector CT imaging of the chest was performed during intravenous contrast administration. CONTRAST:  41m ISOVUE-300 IOPAMIDOL (ISOVUE-300) INJECTION 61% COMPARISON:  10/07/2016 and 02/19/2016 FINDINGS: Cardiovascular: No acute findings. Stable mild cardiomegaly. Aortic and coronary artery atherosclerosis. Mediastinum/Nodes: Stable mild diffuse multinodular goiter. 13 mm right paratracheal lymph node and 12 mm AP window lymph node remain stable compared to previous studies. No new or increased lymphadenopathy identified. Lungs/Pleura: Wedge-shaped consolidation and volume loss in the posterior right upper lobe remains stable, and attributable to post treatment changes. Bilateral parenchymal scarring and mild bronchiectasis also remains stable. Tiny scattered right lung nodules remain stable, largest in the posterior right lower lobe measuring 6 mm on image 77/8. No new or enlarging pulmonary nodules or masses identified. No evidence of pleural effusion. Pulmonary hyperinflation again seen, consistent with COPD. Upper Abdomen:  Unremarkable.  Normal adrenal glands. Musculoskeletal: No suspicious bone lesions. Interval healing of sternal fracture since prior study. IMPRESSION: Stable mild mediastinal lymphadenopathy. Stable tiny sub-cm right lung nodules and postradiation  changes. No new or progressive disease within the thorax. Stable incidental findings including mild multinodular goiter, COPD, and aortic and coronary artery atherosclerosis. Electronically Signed   By: JEarle GellM.D.   On: 02/21/2017 09:22   ASSESSMENT AND PLAN:  This is a very pleasant [age]years old white female diagnosed with metastatic non-small cell lung cancer, adenocarcinoma with right upper lobe lung nodules in addition to smaller bilateral pulmonary nodules and positive MET mutations. She is status post stereotactic radiotherapy to the right upper lobe pulmonary nodule and has been observation since that time. Her recent CT scan of the chest showed no evidence for disease progression. I discussed the scan results with the patient today. I will see her back for follow-up visit in one year for evaluation and repeat CT scan of the chest. She was advised to call immediately if she has any concerning symptoms in the interval. The patient voices understanding of current disease status and treatment options and is in agreement with the current care plan. All questions were answered. The patient knows to call the clinic with any problems, questions or concerns. We can certainly see the patient much sooner if necessary.  Disclaimer: This note was dictated with voice recognition software. Similar sounding words can  inadvertently be transcribed and may not be corrected upon review.       

## 2017-02-23 ENCOUNTER — Telehealth: Payer: Self-pay | Admitting: Internal Medicine

## 2017-02-23 NOTE — Telephone Encounter (Signed)
Scheduled Appts per 4/18 los. 10yrf/u and CT. Appt reminder letter sent in the mail.

## 2017-03-28 DIAGNOSIS — H401233 Low-tension glaucoma, bilateral, severe stage: Secondary | ICD-10-CM | POA: Diagnosis not present

## 2017-05-03 DIAGNOSIS — J449 Chronic obstructive pulmonary disease, unspecified: Secondary | ICD-10-CM | POA: Diagnosis not present

## 2017-05-03 DIAGNOSIS — C3490 Malignant neoplasm of unspecified part of unspecified bronchus or lung: Secondary | ICD-10-CM | POA: Diagnosis not present

## 2017-05-03 DIAGNOSIS — I509 Heart failure, unspecified: Secondary | ICD-10-CM | POA: Diagnosis not present

## 2017-05-03 DIAGNOSIS — Z6820 Body mass index (BMI) 20.0-20.9, adult: Secondary | ICD-10-CM | POA: Diagnosis not present

## 2017-05-03 DIAGNOSIS — M810 Age-related osteoporosis without current pathological fracture: Secondary | ICD-10-CM | POA: Diagnosis not present

## 2017-05-03 DIAGNOSIS — R7301 Impaired fasting glucose: Secondary | ICD-10-CM | POA: Diagnosis not present

## 2017-05-03 DIAGNOSIS — R413 Other amnesia: Secondary | ICD-10-CM | POA: Diagnosis not present

## 2017-05-09 DIAGNOSIS — H401233 Low-tension glaucoma, bilateral, severe stage: Secondary | ICD-10-CM | POA: Diagnosis not present

## 2017-08-22 ENCOUNTER — Other Ambulatory Visit: Payer: Self-pay | Admitting: Internal Medicine

## 2017-08-22 DIAGNOSIS — R7301 Impaired fasting glucose: Secondary | ICD-10-CM | POA: Diagnosis not present

## 2017-08-22 DIAGNOSIS — C3490 Malignant neoplasm of unspecified part of unspecified bronchus or lung: Secondary | ICD-10-CM | POA: Diagnosis not present

## 2017-08-22 DIAGNOSIS — I10 Essential (primary) hypertension: Secondary | ICD-10-CM | POA: Diagnosis not present

## 2017-08-22 DIAGNOSIS — Z6820 Body mass index (BMI) 20.0-20.9, adult: Secondary | ICD-10-CM | POA: Diagnosis not present

## 2017-08-22 DIAGNOSIS — I509 Heart failure, unspecified: Secondary | ICD-10-CM | POA: Diagnosis not present

## 2017-08-22 DIAGNOSIS — J449 Chronic obstructive pulmonary disease, unspecified: Secondary | ICD-10-CM | POA: Diagnosis not present

## 2017-09-12 DIAGNOSIS — H5213 Myopia, bilateral: Secondary | ICD-10-CM | POA: Diagnosis not present

## 2017-09-12 DIAGNOSIS — H2511 Age-related nuclear cataract, right eye: Secondary | ICD-10-CM | POA: Diagnosis not present

## 2017-09-12 DIAGNOSIS — H43813 Vitreous degeneration, bilateral: Secondary | ICD-10-CM | POA: Diagnosis not present

## 2017-09-12 DIAGNOSIS — H401233 Low-tension glaucoma, bilateral, severe stage: Secondary | ICD-10-CM | POA: Diagnosis not present

## 2017-10-23 ENCOUNTER — Telehealth: Payer: Self-pay | Admitting: Internal Medicine

## 2017-10-23 NOTE — Telephone Encounter (Signed)
error 

## 2018-01-29 ENCOUNTER — Emergency Department (HOSPITAL_COMMUNITY): Payer: Medicare Other

## 2018-01-29 ENCOUNTER — Encounter (HOSPITAL_COMMUNITY): Payer: Self-pay | Admitting: Family Medicine

## 2018-01-29 ENCOUNTER — Inpatient Hospital Stay (HOSPITAL_COMMUNITY)
Admission: EM | Admit: 2018-01-29 | Discharge: 2018-02-01 | DRG: 481 | Disposition: A | Discharge status: Skilled Nursing Facility | Payer: Medicare Other | Attending: Internal Medicine | Admitting: Internal Medicine

## 2018-01-29 DIAGNOSIS — M25552 Pain in left hip: Secondary | ICD-10-CM

## 2018-01-29 DIAGNOSIS — D62 Acute posthemorrhagic anemia: Secondary | ICD-10-CM | POA: Diagnosis not present

## 2018-01-29 DIAGNOSIS — R52 Pain, unspecified: Secondary | ICD-10-CM

## 2018-01-29 DIAGNOSIS — I083 Combined rheumatic disorders of mitral, aortic and tricuspid valves: Secondary | ICD-10-CM | POA: Diagnosis present

## 2018-01-29 DIAGNOSIS — Z923 Personal history of irradiation: Secondary | ICD-10-CM | POA: Diagnosis not present

## 2018-01-29 DIAGNOSIS — Z79899 Other long term (current) drug therapy: Secondary | ICD-10-CM

## 2018-01-29 DIAGNOSIS — Z7951 Long term (current) use of inhaled steroids: Secondary | ICD-10-CM | POA: Diagnosis not present

## 2018-01-29 DIAGNOSIS — I110 Hypertensive heart disease with heart failure: Secondary | ICD-10-CM | POA: Diagnosis present

## 2018-01-29 DIAGNOSIS — W19XXXA Unspecified fall, initial encounter: Secondary | ICD-10-CM | POA: Diagnosis not present

## 2018-01-29 DIAGNOSIS — D72829 Elevated white blood cell count, unspecified: Secondary | ICD-10-CM

## 2018-01-29 DIAGNOSIS — E860 Dehydration: Secondary | ICD-10-CM | POA: Diagnosis not present

## 2018-01-29 DIAGNOSIS — J129 Viral pneumonia, unspecified: Secondary | ICD-10-CM | POA: Diagnosis not present

## 2018-01-29 DIAGNOSIS — S72142A Displaced intertrochanteric fracture of left femur, initial encounter for closed fracture: Principal | ICD-10-CM | POA: Diagnosis present

## 2018-01-29 DIAGNOSIS — S72002A Fracture of unspecified part of neck of left femur, initial encounter for closed fracture: Secondary | ICD-10-CM

## 2018-01-29 DIAGNOSIS — I351 Nonrheumatic aortic (valve) insufficiency: Secondary | ICD-10-CM | POA: Diagnosis not present

## 2018-01-29 DIAGNOSIS — M25572 Pain in left ankle and joints of left foot: Secondary | ICD-10-CM | POA: Diagnosis not present

## 2018-01-29 DIAGNOSIS — D696 Thrombocytopenia, unspecified: Secondary | ICD-10-CM | POA: Diagnosis not present

## 2018-01-29 DIAGNOSIS — Z9849 Cataract extraction status, unspecified eye: Secondary | ICD-10-CM

## 2018-01-29 DIAGNOSIS — M6281 Muscle weakness (generalized): Secondary | ICD-10-CM | POA: Diagnosis not present

## 2018-01-29 DIAGNOSIS — W010XXA Fall on same level from slipping, tripping and stumbling without subsequent striking against object, initial encounter: Secondary | ICD-10-CM | POA: Diagnosis present

## 2018-01-29 DIAGNOSIS — Z7982 Long term (current) use of aspirin: Secondary | ICD-10-CM

## 2018-01-29 DIAGNOSIS — I272 Pulmonary hypertension, unspecified: Secondary | ICD-10-CM | POA: Diagnosis present

## 2018-01-29 DIAGNOSIS — I10 Essential (primary) hypertension: Secondary | ICD-10-CM | POA: Diagnosis not present

## 2018-01-29 DIAGNOSIS — J701 Chronic and other pulmonary manifestations due to radiation: Secondary | ICD-10-CM | POA: Diagnosis not present

## 2018-01-29 DIAGNOSIS — E569 Vitamin deficiency, unspecified: Secondary | ICD-10-CM | POA: Diagnosis not present

## 2018-01-29 DIAGNOSIS — Z90710 Acquired absence of both cervix and uterus: Secondary | ICD-10-CM | POA: Diagnosis not present

## 2018-01-29 DIAGNOSIS — J309 Allergic rhinitis, unspecified: Secondary | ICD-10-CM | POA: Diagnosis not present

## 2018-01-29 DIAGNOSIS — T148XXA Other injury of unspecified body region, initial encounter: Secondary | ICD-10-CM | POA: Diagnosis not present

## 2018-01-29 DIAGNOSIS — E871 Hypo-osmolality and hyponatremia: Secondary | ICD-10-CM

## 2018-01-29 DIAGNOSIS — S72042A Displaced fracture of base of neck of left femur, initial encounter for closed fracture: Secondary | ICD-10-CM | POA: Diagnosis not present

## 2018-01-29 DIAGNOSIS — S7290XD Unspecified fracture of unspecified femur, subsequent encounter for closed fracture with routine healing: Secondary | ICD-10-CM | POA: Diagnosis not present

## 2018-01-29 DIAGNOSIS — Z8781 Personal history of (healed) traumatic fracture: Secondary | ICD-10-CM | POA: Diagnosis not present

## 2018-01-29 DIAGNOSIS — J449 Chronic obstructive pulmonary disease, unspecified: Secondary | ICD-10-CM | POA: Diagnosis not present

## 2018-01-29 DIAGNOSIS — I509 Heart failure, unspecified: Secondary | ICD-10-CM | POA: Diagnosis not present

## 2018-01-29 DIAGNOSIS — R262 Difficulty in walking, not elsewhere classified: Secondary | ICD-10-CM | POA: Diagnosis not present

## 2018-01-29 DIAGNOSIS — C3411 Malignant neoplasm of upper lobe, right bronchus or lung: Secondary | ICD-10-CM | POA: Diagnosis not present

## 2018-01-29 DIAGNOSIS — I2510 Atherosclerotic heart disease of native coronary artery without angina pectoris: Secondary | ICD-10-CM | POA: Diagnosis present

## 2018-01-29 DIAGNOSIS — E0590 Thyrotoxicosis, unspecified without thyrotoxic crisis or storm: Secondary | ICD-10-CM | POA: Diagnosis present

## 2018-01-29 DIAGNOSIS — S79911A Unspecified injury of right hip, initial encounter: Secondary | ICD-10-CM | POA: Diagnosis not present

## 2018-01-29 DIAGNOSIS — Z85118 Personal history of other malignant neoplasm of bronchus and lung: Secondary | ICD-10-CM

## 2018-01-29 DIAGNOSIS — D509 Iron deficiency anemia, unspecified: Secondary | ICD-10-CM | POA: Diagnosis not present

## 2018-01-29 DIAGNOSIS — F329 Major depressive disorder, single episode, unspecified: Secondary | ICD-10-CM | POA: Diagnosis not present

## 2018-01-29 DIAGNOSIS — K5900 Constipation, unspecified: Secondary | ICD-10-CM | POA: Diagnosis not present

## 2018-01-29 DIAGNOSIS — E039 Hypothyroidism, unspecified: Secondary | ICD-10-CM | POA: Diagnosis not present

## 2018-01-29 DIAGNOSIS — G8911 Acute pain due to trauma: Secondary | ICD-10-CM | POA: Diagnosis not present

## 2018-01-29 LAB — BASIC METABOLIC PANEL
ANION GAP: 9 (ref 5–15)
BUN: 17 mg/dL (ref 6–20)
CALCIUM: 8.8 mg/dL — AB (ref 8.9–10.3)
CHLORIDE: 107 mmol/L (ref 101–111)
CO2: 25 mmol/L (ref 22–32)
Creatinine, Ser: 0.93 mg/dL (ref 0.44–1.00)
GFR calc non Af Amer: 51 mL/min — ABNORMAL LOW (ref >60)
GFR, EST AFRICAN AMERICAN: 60 mL/min — AB (ref >60)
GLUCOSE: 140 mg/dL — AB (ref 65–99)
POTASSIUM: 3.7 mmol/L (ref 3.5–5.1)
Sodium: 141 mmol/L (ref 135–145)

## 2018-01-29 LAB — CBC WITH DIFFERENTIAL/PLATELET
BASOS PCT: 0 %
Basophils Absolute: 0 10*3/uL (ref 0.0–0.1)
Eosinophils Absolute: 0.2 10*3/uL (ref 0.0–0.7)
Eosinophils Relative: 2 %
HEMATOCRIT: 38.3 % (ref 36.0–46.0)
HEMOGLOBIN: 12.5 g/dL (ref 12.0–15.0)
LYMPHS PCT: 14 %
Lymphs Abs: 1.3 10*3/uL (ref 0.7–4.0)
MCH: 29.8 pg (ref 26.0–34.0)
MCHC: 32.6 g/dL (ref 30.0–36.0)
MCV: 91.4 fL (ref 78.0–100.0)
MONO ABS: 0.8 10*3/uL (ref 0.1–1.0)
MONOS PCT: 9 %
NEUTROS ABS: 6.8 10*3/uL (ref 1.7–7.7)
NEUTROS PCT: 75 %
Platelets: 179 10*3/uL (ref 150–400)
RBC: 4.19 MIL/uL (ref 3.87–5.11)
RDW: 13.9 % (ref 11.5–15.5)
WBC: 9.1 10*3/uL (ref 4.0–10.5)

## 2018-01-29 LAB — PROTIME-INR
INR: 1.1
Prothrombin Time: 14.1 seconds (ref 11.4–15.2)

## 2018-01-29 MED ORDER — FENTANYL CITRATE (PF) 100 MCG/2ML IJ SOLN
50.0000 ug | Freq: Once | INTRAMUSCULAR | Status: AC
  Administered 2018-01-29: 50 ug via INTRAVENOUS
  Filled 2018-01-29: qty 2

## 2018-01-29 MED ORDER — MORPHINE SULFATE (PF) 4 MG/ML IV SOLN
4.0000 mg | Freq: Once | INTRAVENOUS | Status: AC
  Administered 2018-01-29: 4 mg via INTRAVENOUS
  Filled 2018-01-29: qty 1

## 2018-01-29 NOTE — ED Notes (Addendum)
Pt started de sating on the monitor. Was placed on 4l [place] and O2 rose to 95%. Pt is easily aroused to voice and reports a significant decrease in pain.

## 2018-01-29 NOTE — ED Notes (Signed)
Ortho tech contacted. They do not do [place] traction down in the ED.

## 2018-01-29 NOTE — ED Provider Notes (Signed)
[facility] DEPT Provider Note   CSN: [id number] Arrival date & time: 01/29/18  1836     History   Chief Complaint Chief Complaint  Patient presents with  . Hip Pain  . Fall    HPI Michelle Levine is a 83 y.o. female.  Patient is a 84 year old female with a history of coronary artery disease, aortic insufficiency, tricuspid regurg, pulmonary hypertension, hypertension, COPD and CHF who presents with hip pain after a fall.  She states that she was trying to walk down the sidewalk and get in her car and she tripped on a curb.  She fell over onto her left side.  She complains of pain to her left hip.  She denies hitting her head.  She denies any other injuries.  She denies any neck or back pain.  She was given 100 mcg of fentanyl in route by EMS.  She is not on anticoagulants.  She still complaining of significant pain in her left hip.     Past Medical History:  Diagnosis Date  . Aortic insufficiency   . CAD (coronary artery disease)   . Cancer ([place])   . Cataracts, bilateral   . CHF (congestive heart failure) ([place])   . COPD (chronic obstructive pulmonary disease) ([place])   . Hypertension   . Mitral insufficiency   . Pulmonary hypertension ([place])   . Thyroid disease   . Tricuspid regurgitation     Patient Active Problem List   Diagnosis Date Noted  . S/p left hip fracture 01/29/2018  . Sternal fracture 10/07/2016  . Acute asthmatic bronchitis 09/05/2016  . Radiation pneumonitis ([place]) 10/13/2015  . Chronic cough 07/03/2014  . Bronchogenic cancer of right lung ([place]) 03/20/2014  . Multiple lung nodules 11/18/2012  . Aortic insufficiency   . Mitral insufficiency   . Mild tricuspid regurgitation   . Pulmonary hypertension ([place])   . CHF (congestive heart failure) ([place])   . COPD mixed type ([place])   . Essential hypertension     Past Surgical History:  Procedure Laterality Date  . ABDOMINAL HYSTERECTOMY    . APPENDECTOMY    . BREAST  LUMPECTOMY    . CATARACT EXTRACTION    . EYE SURGERY       OB History   None      Home Medications    Prior to Admission medications   Medication Sig Start Date End Date Taking? Authorizing Provider  amLODipine (NORVASC) 5 MG tablet Take 5 mg by mouth daily. 12/06/13  Yes Lelon Perla, MD  aspirin EC 81 MG tablet Take 81 mg by mouth as directed. MWF   Yes [provider]  bisoprolol (ZEBETA) 5 MG tablet TAKE 1 TABLET BY MOUTH EVERY DAY 08/22/17  Yes Tanda Rockers, MD  Calcium Carbonate-Vitamin D (CALCIUM + D PO) Take 1 capsule by mouth daily.    Yes [provider]  citalopram (CELEXA) 20 MG tablet Take 20 mg by mouth daily.    Yes [provider]  furosemide (LASIX) 40 MG tablet Take 40 mg by mouth daily.    Yes [provider]  galantamine (RAZADYNE) 8 MG tablet TAKE 1 TABLET BY MOUTH EACH MORNING WITH FOOD 12/31/17  Yes [provider]  KLOR-CON 10 10 MEQ tablet Take 10 mEq by mouth daily. 12/21/17  Yes [provider]  methimazole (TAPAZOLE) 10 MG tablet Take 5 mg by mouth daily.    Yes [provider]  Multiple Vitamin ([place])  TABS Take 1 tablet by mouth daily.    Yes [provider]  fluticasone (FLONASE) 50 MCG/ACT nasal spray Place 1 spray into both nostrils daily as needed for allergies.  12/01/16   [provider]  zoledronic acid (RECLAST) 5 MG/100ML SOLN Inject 5 mg into the vein. Reported on 11/30/2015    [provider]    Family History Family History  Problem Relation Age of Onset  . Heart attack Mother 33  . Pneumonia Father 25  . Heart attack Brother   . Leukemia Brother   . Heart attack Brother   . Leukemia Brother     Social History Social History   Tobacco Use  . Smoking status: Never Smoker  . Smokeless tobacco: Never Used  Substance Use Topics  . Alcohol use: No  . Drug use: No     Allergies   Patient has no known  allergies.   Review of Systems Review of Systems  Constitutional: Negative for activity change, appetite change and fever.  HENT: Negative for dental problem, nosebleeds and trouble swallowing.   Eyes: Negative for pain and visual disturbance.  Respiratory: Negative for shortness of breath.   Cardiovascular: Negative for chest pain.  Gastrointestinal: Negative for abdominal pain, nausea and vomiting.  Genitourinary: Negative for dysuria and hematuria.  Musculoskeletal: Positive for arthralgias. Negative for back pain, joint swelling and neck pain.  Skin: Negative for wound.  Neurological: Negative for weakness, numbness and headaches.  Psychiatric/Behavioral: Negative for confusion.     Physical Exam Updated Vital Signs BP (!) 154/86   Pulse 88   Resp (!) 25   SpO2 95%   Physical Exam  Constitutional: She is oriented to person, place, and time. She appears well-developed and well-nourished.  HENT:  Head: Normocephalic and atraumatic.  Nose: Nose normal.  Eyes: Pupils are equal, round, and reactive to light. Conjunctivae are normal.  Neck:  No pain to the cervical, thoracic, or LS spine.  No step-offs or deformities noted  Cardiovascular: Normal rate and regular rhythm.  No murmur heard. No evidence of external trauma to the chest or abdomen  Pulmonary/Chest: Effort normal and breath sounds normal. No respiratory distress. She has no wheezes. She exhibits no tenderness.  Abdominal: Soft. Bowel sounds are normal. She exhibits no distension. There is no tenderness.  Musculoskeletal: Normal range of motion.  Positive tenderness to the left hip and proximal left femur.  There is no pain to the knee or ankle.  She has external rotation with some mild shortening of the leg.  Pedal pulses are intact.  She has normal sensation distally.  No pain on palpation or ROM of the other extremities  Neurological: She is alert and oriented to person, place, and time.  Skin: Skin is warm and  dry. Capillary refill takes less than 2 seconds.  Psychiatric: She has a normal mood and affect.  Vitals reviewed.    ED Treatments / Results  Labs (all labs ordered are listed, but only abnormal results are displayed) Labs Reviewed  BASIC METABOLIC PANEL - Abnormal; Notable for the following components:      Result Value   Glucose, Bld 140 (*)    Calcium 8.8 (*)    GFR calc non Af Amer 51 (*)    GFR calc Af Amer 60 (*)    All other components within normal limits  CBC WITH DIFFERENTIAL/PLATELET  PROTIME-INR  TYPE AND SCREEN  ABO/RH    EKG EKG Interpretation  Date/Time:  Monday January 29 2018 20:44:19 EDT Ventricular Rate:  86 PR Interval:    QRS Duration: 112 QT Interval:  443 QTC Calculation: 530 R Axis:   38 Text Interpretation:  Sinus or ectopic atrial rhythm Prolonged PR interval Consider left atrial enlargement LVH with IVCD and secondary repol abnrm Anterior ST elevation, probably due to LVH Prolonged QT interval Baseline wander in lead(s) V2 since last tracing no significant change Confirmed by Malvin Johns [phone]) on 01/29/2018 9:33:16 PM   Radiology Ct Hip Left Wo Contrast  Result Date: 01/29/2018 CLINICAL DATA:  Comminuted left hip fracture, assessment for underlying lesion. EXAM: CT OF THE LEFT HIP WITHOUT CONTRAST TECHNIQUE: Multidetector CT imaging of the left hip was performed according to the standard protocol. Multiplanar CT image reconstructions were also generated. COMPARISON:  01/29/2018 FINDINGS: Bones/Joint/Cartilage Comminuted intertrochanteric fracture with multiple greater trochanteric fragments, a moderate-sized and mildly fragmented lesser trochanteric fragment likely containing a significant component of the calcar, and the dominant intertrochanteric fracture plane demonstrating varus angulation and some apex anterior angulation. There is hematoma along the fracture plane but I do not perceive a underlying lytic lesion or other lesion to suggest that  this fracture is due to underlying pathologic malignancy. Ligaments Suboptimally assessed by CT. Muscles and Tendons Expected surrounding edema. There is gluteus minimus chronic atrophy. Soft tissues Expected hematoma around the fracture site. Other: Sigmoid colon diverticulosis. IMPRESSION: 1. No underlying lesion associated with the intertrochanteric fracture to suggest a pathologic fracture. 2. The intertrochanteric fracture has comminuted lesser and greater trochanteric components as well as varus angulation. Electronically Signed   By: Van Clines M.D.   On: 01/29/2018 21:33   Dg Chest Port 1 View  Result Date: 01/29/2018 CLINICAL DATA:  84 year old female preoperative study left femur fracture. History of right lung carcinoma status post radiation therapy. EXAM: PORTABLE CHEST 1 VIEW COMPARISON:  Chest radiographs 10/07/2016 and earlier. Chest CT 02/21/2017. FINDINGS: Portable AP semi upright view at 1952 hours. Small bilateral pleural effusions in 2017 have resolved. Stable cardiomegaly and mediastinal contours. Large lung volumes. Vague chronic increased right paratracheal and medial right upper lung opacity appears stable since 2017 and is correlated to post radiation changes in the right upper lung on the 2018 CT. No pneumothorax, pulmonary edema or acute pulmonary opacity. No acute osseous abnormality identified in the chest IMPRESSION: 1.  No acute cardiopulmonary abnormality. 2. Chronic cardiomegaly, pulmonary hyperinflation, and right upper lobe radiation fibrosis. Electronically Signed   By: Genevie Ann M.D.   On: 01/29/2018 20:29   Dg Hip Unilat W Or Wo Pelvis 2-3 Views Left  Result Date: 01/29/2018 CLINICAL DATA:  Fall today with left hip pain. EXAM: DG HIP (WITH OR WITHOUT PELVIS) 2-3V LEFT COMPARISON:  None. FINDINGS: Intertrochanteric left hip fracture with mild comminution. There is enough underlying lucency that I recommend cross-sectional imaging such as CT in order to exclude an  underlying lytic lesion/pathologic fracture. Clustered gallstones in the right abdomen. IMPRESSION: 1. Moderately comminuted intratrochanteric fracture on the left with mild varus angulation. There is enough underlying lucency that the possibility of a lytic lesion and pathologic fracture is raised, and CT should be considered to further characterize. 2. Cholelithiasis. Electronically Signed   By: Van Clines M.D.   On: 01/29/2018 19:59    Procedures Procedures (including critical care time)  Medications Ordered in ED Medications  fentaNYL (SUBLIMAZE) injection 50 mcg (50 mcg Intravenous Given 01/29/18 1927)  morphine 4 MG/ML injection 4 mg (4 mg Intravenous Given 01/29/18 2058)  fentaNYL (  SUBLIMAZE) injection 50 mcg (50 mcg Intravenous Given 01/29/18 2147)     Initial Impression / Assessment and Plan / ED Course  I have reviewed the triage vital signs and the nursing notes.  Pertinent labs & imaging results that were available during my care of the patient were reviewed by me and considered in my medical decision making (see chart for details).     Patient is a 84 year old female who presents after mechanical fall.  She has evidence of a left intertrochanteric hip fracture.  There is a question of a lytic lesion and therefore a CT scan of the hip was performed which shows no evidence of lytic lesions.  Preop labs were obtained which were non-concerning.  I spoke with Dr. Ninfa Linden with orthopedics who will consult on the patient.  He requested medicine admission.  He advises that patient does not need to remain n.p.o. after midnight as he is not sure when he is going to be able to repair the hip fracture.  I spoke with Dr. Laren Everts who will admit the patient.  Dr. Ninfa Linden did request the orthopedic technician to place the patient in Buck's traction.  I did place order in the [place] was consulted who advises that they do not start this until patient is admitted to their room.  Final  Clinical Impressions(s) / ED Diagnoses   Final diagnoses:  Closed fracture of left hip, initial encounter [facility])    ED Discharge Orders    None       Malvin Johns, MD 01/29/18 2332

## 2018-01-29 NOTE — H&P (Addendum)
[facility]                                                                                    Patient Demographics  Michelle Levine, is a 84 y.o. female  CSN: [id number]  MRN: [id number]  DOB - [birth year]  Admit Date - 01/29/2018  Outpatient Primary MD for the patient is Crist Infante, MD   With History of -  Past Medical History:  Diagnosis Date  . Aortic insufficiency   . CAD (coronary artery disease)   . Cancer ([place])   . Cataracts, bilateral   . CHF (congestive heart failure) ([place])   . COPD (chronic obstructive pulmonary disease) ([place])   . Hypertension   . Mitral insufficiency   . Pulmonary hypertension ([place])   . Thyroid disease   . Tricuspid regurgitation       Past Surgical History:  Procedure Laterality Date  . ABDOMINAL HYSTERECTOMY    . APPENDECTOMY    . BREAST LUMPECTOMY    . CATARACT EXTRACTION    . EYE SURGERY      in for   Chief Complaint  Patient presents with  . Hip Pain  . Fall     HPI  Michelle Levine  is a 84 y.o. female, with past medical history significant for right lung cancer status post radiation and being followed by Dr. Earlie Server presenting today after falling/tripping on the curb while going to her car.  Patient is usually ambulatory and lives independently.  X-ray of left hip showed intertrochanteric fracture and a questionable lytic lesion which was not confirmed by CT of the hip.  Patient denies any preceding chest pains or palpitations.    Review of Systems    In addition to the HPI above,  No Fever-chills, No Headache, No changes with Vision or hearing, No problems swallowing food or Liquids, No Chest pain, Cough or Shortness of Breath, No Abdominal pain, No Nausea or Vommitting, Bowel movements are regular, No Blood in stool or Urine, No dysuria, No new skin rashes or bruises, No recent weight gain or loss, No polyuria, polydypsia or polyphagia,   A full 10 point Review of Systems was done, except as  stated above, all other Review of Systems were negative.   Social History Social History   Tobacco Use  . Smoking status: Never Smoker  . Smokeless tobacco: Never Used  Substance Use Topics  . Alcohol use: No    Family History Family History  Problem Relation Age of Onset  . Heart attack Mother 55  . Pneumonia Father 78  . Heart attack Brother   . Leukemia Brother   . Heart attack Brother   . Leukemia Brother      Prior to Admission medications   Medication Sig Start Date End Date Taking? Authorizing Provider  amLODipine (NORVASC) 5 MG tablet Take 5 mg by mouth daily. 12/06/13  Yes Lelon Perla, MD  aspirin EC 81 MG tablet Take 81 mg by mouth as directed. MWF   Yes [provider]  bisoprolol (ZEBETA) 5 MG tablet TAKE 1 TABLET BY MOUTH EVERY DAY 08/22/17  Yes Tanda Rockers, MD  Calcium Carbonate-Vitamin  D (CALCIUM + D PO) Take 1 capsule by mouth daily.    Yes [provider]  citalopram (CELEXA) 20 MG tablet Take 20 mg by mouth daily.    Yes [provider]  furosemide (LASIX) 40 MG tablet Take 40 mg by mouth daily.    Yes [provider]  galantamine (RAZADYNE) 8 MG tablet TAKE 1 TABLET BY MOUTH EACH MORNING WITH FOOD 12/31/17  Yes [provider]  KLOR-CON 10 10 MEQ tablet Take 10 mEq by mouth daily. 12/21/17  Yes [provider]  methimazole (TAPAZOLE) 10 MG tablet Take 5 mg by mouth daily.    Yes [provider]  Multiple Vitamin (MULITIVITAMIN WITH MINERALS) TABS Take 1 tablet by mouth daily.    Yes [provider]  fluticasone (FLONASE) 50 MCG/ACT nasal spray Place 1 spray into both nostrils daily as needed for allergies.  12/01/16   [provider]  zoledronic acid (RECLAST) 5 MG/100ML SOLN Inject 5 mg into the vein. Reported on 11/30/2015    [provider]    No Known Allergies  Physical Exam  Vitals  Blood pressure (!) 154/86, pulse 88, resp. rate (!) 25, SpO2 95  %.   1. General elderly lady, in pain  2. Normal affect and insight, Not Suicidal or Homicidal, pleasantly confused after pain medications were given.  3. No F.N deficits, grossly, patient moving all extremities.  4. Ears and Eyes appear Normal, Conjunctivae clear, PERRLA. Moist Oral Mucosa.  5. Supple Neck, No JVD,   6. Symmetrical Chest wall movement, decreased breath sounds on the right upper lobe.  7. RRR, No Gallops, systolic murmur heard.  8. Positive Bowel Sounds, Abdomen Soft, Non tender, No organomegaly appriciated,No rebound -guarding or rigidity.  9.  No Cyanosis, Normal Skin Turgor, No Skin Rash or Bruise.  10. Good muscle tone,  joints appear normal , no effusions, Normal ROM.  Data Review  CBC Recent Labs  Lab 01/29/18 1956  WBC 9.1  HGB 12.5  HCT 38.3  PLT 179  MCV 91.4  MCH 29.8  MCHC 32.6  RDW 13.9  LYMPHSABS 1.3  MONOABS 0.8  EOSABS 0.2  BASOSABS 0.0   ------------------------------------------------------------------------------------------------------------------  Chemistries  Recent Labs  Lab 01/29/18 1956  NA 141  K 3.7  CL 107  CO2 25  GLUCOSE 140*  BUN 17  CREATININE 0.93  CALCIUM 8.8*   ------------------------------------------------------------------------------------------------------------------ CrCl cannot be calculated (Unknown ideal weight.). ------------------------------------------------------------------------------------------------------------------ No results for input(s): TSH, T4TOTAL, T3FREE, THYROIDAB in the last 72 hours.  Invalid input(s): FREET3   Coagulation profile Recent Labs  Lab 01/29/18 1956  INR 1.10   ------------------------------------------------------------------------------------------------------------------- No results for input(s): DDIMER in the last 72 hours. -------------------------------------------------------------------------------------------------------------------  Cardiac  Enzymes No results for input(s): CKMB, TROPONINI, MYOGLOBIN in the last 168 hours.  Invalid input(s): CK ------------------------------------------------------------------------------------------------------------------ Invalid input(s): POCBNP   ---------------------------------------------------------------------------------------------------------------  Urinalysis No results found for: COLORURINE, APPEARANCEUR, LABSPEC, PHURINE, GLUCOSEU, HGBUR, BILIRUBINUR, KETONESUR, PROTEINUR, UROBILINOGEN, NITRITE, LEUKOCYTESUR  ----------------------------------------------------------------------------------------------------------------   Imaging results:   Ct Hip Left Wo Contrast  Result Date: 01/29/2018 CLINICAL DATA:  Comminuted left hip fracture, assessment for underlying lesion. EXAM: CT OF THE LEFT HIP WITHOUT CONTRAST TECHNIQUE: Multidetector CT imaging of the left hip was performed according to the standard protocol. Multiplanar CT image reconstructions were also generated. COMPARISON:  01/29/2018 FINDINGS: Bones/Joint/Cartilage Comminuted intertrochanteric fracture with multiple greater trochanteric fragments, a moderate-sized and mildly fragmented lesser trochanteric fragment likely containing a significant component of the calcar, and the dominant  intertrochanteric fracture plane demonstrating varus angulation and some apex anterior angulation. There is hematoma along the fracture plane but I do not perceive a underlying lytic lesion or other lesion to suggest that this fracture is due to underlying pathologic malignancy. Ligaments Suboptimally assessed by CT. Muscles and Tendons Expected surrounding edema. There is gluteus minimus chronic atrophy. Soft tissues Expected hematoma around the fracture site. Other: Sigmoid colon diverticulosis. IMPRESSION: 1. No underlying lesion associated with the intertrochanteric fracture to suggest a pathologic fracture. 2. The intertrochanteric fracture  has comminuted lesser and greater trochanteric components as well as varus angulation. Electronically Signed   By: Van Clines M.D.   On: 01/29/2018 21:33   Dg Chest Port 1 View  Result Date: 01/29/2018 CLINICAL DATA:  84 year old female preoperative study left femur fracture. History of right lung carcinoma status post radiation therapy. EXAM: PORTABLE CHEST 1 VIEW COMPARISON:  Chest radiographs 10/07/2016 and earlier. Chest CT 02/21/2017. FINDINGS: Portable AP semi upright view at 1952 hours. Small bilateral pleural effusions in 2017 have resolved. Stable cardiomegaly and mediastinal contours. Large lung volumes. Vague chronic increased right paratracheal and medial right upper lung opacity appears stable since 2017 and is correlated to post radiation changes in the right upper lung on the 2018 CT. No pneumothorax, pulmonary edema or acute pulmonary opacity. No acute osseous abnormality identified in the chest IMPRESSION: 1.  No acute cardiopulmonary abnormality. 2. Chronic cardiomegaly, pulmonary hyperinflation, and right upper lobe radiation fibrosis. Electronically Signed   By: Genevie Ann M.D.   On: 01/29/2018 20:29   Dg Hip Unilat W Or Wo Pelvis 2-3 Views Left  Result Date: 01/29/2018 CLINICAL DATA:  Fall today with left hip pain. EXAM: DG HIP (WITH OR WITHOUT PELVIS) 2-3V LEFT COMPARISON:  None. FINDINGS: Intertrochanteric left hip fracture with mild comminution. There is enough underlying lucency that I recommend cross-sectional imaging such as CT in order to exclude an underlying lytic lesion/pathologic fracture. Clustered gallstones in the right abdomen. IMPRESSION: 1. Moderately comminuted intratrochanteric fracture on the left with mild varus angulation. There is enough underlying lucency that the possibility of a lytic lesion and pathologic fracture is raised, and CT should be considered to further characterize. 2. Cholelithiasis. Electronically Signed   By: Van Clines M.D.   On:  01/29/2018 19:59    My personal review of EKG: Normal sinus rhythm at 86 bpm with first-degree AV block and nonspecific ST changes.  Personally reviewed Old Chart from 2017 in 2018  Assessment & Plan  1.  Left hip fracture, CT did not show any lytic lesions      Buck's traction      Dr. Ninfa Linden on consult and not sure about date and time of surgery so patient will be continued on full liquid diet at this time      Pain control  2.  History of lung cancer and pulmonary hypertension status post radiation in the past being followed by Dr. Earlie Server.  3.  History of hypertension continue with Zebeta   DVT Prophylaxis Heparin  AM Labs Ordered, also please review Full Orders    Code Status full  Disposition Plan: Undetermined  Time spent in minutes : 34 minutes  Condition GUARDED   @SIGNATURE @

## 2018-01-29 NOTE — Consult Note (Signed)
I have reviewed the patient's left hip and pelvic x-rays as well as her chart.  She does have a history of lung cancer in the past and the radiologist is concerned that this is a pathologic fracture.  Certainly with the mechanical fall that she had, there is a traumatic component to this fracture.  Radiology is recommending a CT scan of her left hip to help rule out a pathologic fracture.  She does need admission to medical service and potentially further metastatic workup prior to final surgical recommendations.  For now she can eat and drink from an orthopedic standpoint.  She needs to have her left leg placed in 10 lbs of Buck's traction.  I will see her first thing in the morning and assess the timing for stabilization of her left intertrochanteric hip fracture.

## 2018-01-29 NOTE — ED Notes (Signed)
Bed: BR83 Expected date:  Expected time:  Means of arrival:  Comments: EMS-hip pain

## 2018-01-29 NOTE — Progress Notes (Signed)
Pt gave permission to ask questions on the nursing admission history in front of her son and granddaughter. Lucius Conn BSN, RN-BC Admissions RN 01/29/2018 8:39 PM

## 2018-01-29 NOTE — ED Triage Notes (Addendum)
Patient was walking outside when she stepped off the curb. Patient fell and landed on her left side. Per EMS, patient is complaining of left hip pain with external rotation and shortening. Also, has a small abrasion on her right wrist. Patient is alert and oriented. Patient received FENTANYL 100 mg (50mg  x 2) for pain during transport.

## 2018-01-30 ENCOUNTER — Other Ambulatory Visit: Payer: Self-pay

## 2018-01-30 ENCOUNTER — Encounter (HOSPITAL_COMMUNITY)
Admission: EM | Disposition: A | Discharge status: Skilled Nursing Facility | Payer: Self-pay | Source: Home / Self Care | Attending: Internal Medicine

## 2018-01-30 ENCOUNTER — Inpatient Hospital Stay (HOSPITAL_COMMUNITY): Payer: Medicare Other

## 2018-01-30 ENCOUNTER — Encounter (HOSPITAL_COMMUNITY): Payer: Self-pay | Admitting: Orthopedic Surgery

## 2018-01-30 ENCOUNTER — Inpatient Hospital Stay (HOSPITAL_COMMUNITY): Payer: Medicare Other | Admitting: Certified Registered Nurse Anesthetist

## 2018-01-30 DIAGNOSIS — E0590 Thyrotoxicosis, unspecified without thyrotoxic crisis or storm: Secondary | ICD-10-CM

## 2018-01-30 HISTORY — PX: FEMUR IM NAIL: SHX1597

## 2018-01-30 LAB — SURGICAL PCR SCREEN
MRSA, PCR: NEGATIVE
Staphylococcus aureus: NEGATIVE

## 2018-01-30 LAB — TYPE AND SCREEN
ABO/RH(D): A POS
Antibody Screen: NEGATIVE

## 2018-01-30 LAB — TSH: TSH: 1.451 u[IU]/mL (ref 0.350–4.500)

## 2018-01-30 LAB — ABO/RH: ABO/RH(D): A POS

## 2018-01-30 LAB — GLUCOSE, CAPILLARY: Glucose-Capillary: 114 mg/dL — ABNORMAL HIGH (ref 65–99)

## 2018-01-30 SURGERY — INSERTION, INTRAMEDULLARY ROD, FEMUR
Anesthesia: Spinal | Laterality: Left

## 2018-01-30 MED ORDER — PROPOFOL 10 MG/ML IV BOLUS
INTRAVENOUS | Status: AC
  Filled 2018-01-30: qty 20

## 2018-01-30 MED ORDER — KETAMINE HCL 10 MG/ML IJ SOLN
INTRAMUSCULAR | Status: DC | PRN
  Administered 2018-01-30: 30 mg via INTRAVENOUS

## 2018-01-30 MED ORDER — ONDANSETRON HCL 4 MG/2ML IJ SOLN
INTRAMUSCULAR | Status: AC
  Filled 2018-01-30: qty 2

## 2018-01-30 MED ORDER — KETAMINE HCL 10 MG/ML IJ SOLN
INTRAMUSCULAR | Status: AC
  Filled 2018-01-30: qty 1

## 2018-01-30 MED ORDER — CEFAZOLIN SODIUM-DEXTROSE 2-4 GM/100ML-% IV SOLN
2.0000 g | Freq: Four times a day (QID) | INTRAVENOUS | Status: AC
  Administered 2018-01-30 – 2018-01-31 (×2): 2 g via INTRAVENOUS
  Filled 2018-01-30 (×2): qty 100

## 2018-01-30 MED ORDER — CEFAZOLIN SODIUM-DEXTROSE 2-4 GM/100ML-% IV SOLN
2.0000 g | INTRAVENOUS | Status: AC
  Administered 2018-01-30: 2 g via INTRAVENOUS
  Filled 2018-01-30: qty 100

## 2018-01-30 MED ORDER — MENTHOL 3 MG MT LOZG: 1.0000 | LOZENGE | OROMUCOSAL | Status: DC | PRN

## 2018-01-30 MED ORDER — DEXAMETHASONE SODIUM PHOSPHATE 10 MG/ML IJ SOLN
INTRAMUSCULAR | Status: DC | PRN
  Administered 2018-01-30: 10 mg via INTRAVENOUS

## 2018-01-30 MED ORDER — FENTANYL CITRATE (PF) 100 MCG/2ML IJ SOLN
INTRAMUSCULAR | Status: DC | PRN
  Administered 2018-01-30: 25 ug via INTRAVENOUS

## 2018-01-30 MED ORDER — PROPOFOL 500 MG/50ML IV EMUL
INTRAVENOUS | Status: DC | PRN
  Administered 2018-01-30: 25 ug/kg/min via INTRAVENOUS

## 2018-01-30 MED ORDER — LACTATED RINGERS IV SOLN
INTRAVENOUS | Status: DC
  Administered 2018-01-30: 12:00:00 via INTRAVENOUS

## 2018-01-30 MED ORDER — METOCLOPRAMIDE HCL 5 MG/ML IJ SOLN: 10.0000 mg | Freq: Once | INTRAMUSCULAR | Status: DC | PRN

## 2018-01-30 MED ORDER — ZOLPIDEM TARTRATE 5 MG PO TABS: 5.0000 mg | ORAL_TABLET | Freq: Every evening | ORAL | Status: DC | PRN

## 2018-01-30 MED ORDER — DOCUSATE SODIUM 100 MG PO CAPS
100.0000 mg | ORAL_CAPSULE | Freq: Two times a day (BID) | ORAL | Status: DC
  Administered 2018-01-30 – 2018-02-01 (×4): 100 mg via ORAL
  Filled 2018-01-30 (×4): qty 1

## 2018-01-30 MED ORDER — POVIDONE-IODINE 10 % EX SWAB: 2.0000 "application " | Freq: Once | CUTANEOUS | Status: DC

## 2018-01-30 MED ORDER — SENNA 8.6 MG PO TABS
1.0000 | ORAL_TABLET | Freq: Two times a day (BID) | ORAL | Status: DC
  Administered 2018-01-30 – 2018-02-01 (×5): 8.6 mg via ORAL
  Filled 2018-01-30 (×5): qty 1

## 2018-01-30 MED ORDER — HYDROCODONE-ACETAMINOPHEN 7.5-325 MG PO TABS
1.0000 | ORAL_TABLET | ORAL | Status: DC | PRN
  Administered 2018-01-30 – 2018-02-01 (×3): 1 via ORAL
  Filled 2018-01-30 (×3): qty 1

## 2018-01-30 MED ORDER — BISOPROLOL FUMARATE 5 MG PO TABS
5.0000 mg | ORAL_TABLET | Freq: Every day | ORAL | Status: DC
  Administered 2018-01-31 – 2018-02-01 (×2): 5 mg via ORAL
  Filled 2018-01-30 (×2): qty 1

## 2018-01-30 MED ORDER — FERROUS SULFATE 325 (65 FE) MG PO TABS
325.0000 mg | ORAL_TABLET | Freq: Three times a day (TID) | ORAL | Status: DC
  Administered 2018-01-31 – 2018-02-01 (×5): 325 mg via ORAL
  Filled 2018-01-30 (×5): qty 1

## 2018-01-30 MED ORDER — STERILE WATER FOR IRRIGATION IR SOLN
Status: DC | PRN
  Administered 2018-01-30: 2000 mL

## 2018-01-30 MED ORDER — MORPHINE SULFATE (PF) 2 MG/ML IV SOLN
2.0000 mg | INTRAVENOUS | Status: DC | PRN
  Administered 2018-01-30 (×2): 2 mg via INTRAVENOUS
  Filled 2018-01-30 (×2): qty 1

## 2018-01-30 MED ORDER — ONDANSETRON HCL 4 MG/2ML IJ SOLN: 4.0000 mg | Freq: Four times a day (QID) | INTRAMUSCULAR | Status: DC | PRN

## 2018-01-30 MED ORDER — SODIUM CHLORIDE 0.9% FLUSH
3.0000 mL | Freq: Two times a day (BID) | INTRAVENOUS | Status: DC
  Administered 2018-01-30 – 2018-02-01 (×4): 3 mL via INTRAVENOUS

## 2018-01-30 MED ORDER — PHENYLEPHRINE HCL 10 MG/ML IJ SOLN
INTRAMUSCULAR | Status: AC
  Filled 2018-01-30: qty 1

## 2018-01-30 MED ORDER — MORPHINE SULFATE (PF) 2 MG/ML IV SOLN
INTRAVENOUS | Status: AC
  Filled 2018-01-30: qty 1

## 2018-01-30 MED ORDER — LIDOCAINE 2% (20 MG/ML) 5 ML SYRINGE
INTRAMUSCULAR | Status: AC
Start: 2018-01-30 — End: 2018-01-30
  Filled 2018-01-30: qty 5

## 2018-01-30 MED ORDER — PHENOL 1.4 % MT LIQD
1.0000 | OROMUCOSAL | Status: DC | PRN
  Filled 2018-01-30: qty 177

## 2018-01-30 MED ORDER — FENTANYL CITRATE (PF) 100 MCG/2ML IJ SOLN
INTRAMUSCULAR | Status: AC
  Filled 2018-01-30: qty 2

## 2018-01-30 MED ORDER — OXYCODONE HCL 5 MG PO TABS: 5.0000 mg | ORAL_TABLET | ORAL | Status: DC | PRN

## 2018-01-30 MED ORDER — IPRATROPIUM-ALBUTEROL 0.5-2.5 (3) MG/3ML IN SOLN: 3.0000 mL | Freq: Four times a day (QID) | RESPIRATORY_TRACT | Status: DC | PRN

## 2018-01-30 MED ORDER — SUCCINYLCHOLINE CHLORIDE 200 MG/10ML IV SOSY
PREFILLED_SYRINGE | INTRAVENOUS | Status: AC
  Filled 2018-01-30: qty 10

## 2018-01-30 MED ORDER — ACETAMINOPHEN 325 MG PO TABS: 325.0000 mg | ORAL_TABLET | Freq: Four times a day (QID) | ORAL | Status: DC | PRN

## 2018-01-30 MED ORDER — CELECOXIB 200 MG PO CAPS
200.0000 mg | ORAL_CAPSULE | Freq: Two times a day (BID) | ORAL | Status: DC
  Administered 2018-01-30 – 2018-01-31 (×3): 200 mg via ORAL
  Filled 2018-01-30 (×3): qty 1

## 2018-01-30 MED ORDER — PHENYLEPHRINE HCL 10 MG/ML IJ SOLN
INTRAMUSCULAR | Status: DC | PRN
  Administered 2018-01-30: 25 ug/min via INTRAVENOUS

## 2018-01-30 MED ORDER — ASPIRIN EC 325 MG PO TBEC
325.0000 mg | DELAYED_RELEASE_TABLET | Freq: Two times a day (BID) | ORAL | Status: DC
  Administered 2018-01-31 – 2018-02-01 (×3): 325 mg via ORAL
  Filled 2018-01-30 (×3): qty 1

## 2018-01-30 MED ORDER — SODIUM CHLORIDE 0.9% FLUSH: 3.0000 mL | INTRAVENOUS | Status: DC | PRN

## 2018-01-30 MED ORDER — PHENYLEPHRINE HCL-NACL 10-0.9 MG/250ML-% IV SOLN
0.0000 ug/min | INTRAVENOUS | Status: DC
  Administered 2018-01-30: 10 ug/min via INTRAVENOUS

## 2018-01-30 MED ORDER — 0.9 % SODIUM CHLORIDE (POUR BTL) OPTIME
TOPICAL | Status: DC | PRN
  Administered 2018-01-30: 1000 mL

## 2018-01-30 MED ORDER — PHENYLEPHRINE HCL-NACL 10-0.9 MG/250ML-% IV SOLN
INTRAVENOUS | Status: AC
  Filled 2018-01-30: qty 250

## 2018-01-30 MED ORDER — SODIUM CHLORIDE 0.9 % IV SOLN: 250.0000 mL | INTRAVENOUS | Status: DC | PRN

## 2018-01-30 MED ORDER — ADULT MULTIVITAMIN W/MINERALS CH
1.0000 | ORAL_TABLET | Freq: Every day | ORAL | Status: DC
  Administered 2018-01-31 – 2018-02-01 (×2): 1 via ORAL
  Filled 2018-01-30 (×2): qty 1

## 2018-01-30 MED ORDER — DEXAMETHASONE SODIUM PHOSPHATE 10 MG/ML IJ SOLN
INTRAMUSCULAR | Status: AC
  Filled 2018-01-30: qty 1

## 2018-01-30 MED ORDER — METOCLOPRAMIDE HCL 5 MG/ML IJ SOLN: 5.0000 mg | Freq: Three times a day (TID) | INTRAMUSCULAR | Status: DC | PRN

## 2018-01-30 MED ORDER — ACETAMINOPHEN 325 MG PO TABS: 650.0000 mg | ORAL_TABLET | Freq: Four times a day (QID) | ORAL | Status: DC | PRN

## 2018-01-30 MED ORDER — PHENYLEPHRINE 40 MCG/ML (10ML) SYRINGE FOR IV PUSH (FOR BLOOD PRESSURE SUPPORT)
PREFILLED_SYRINGE | INTRAVENOUS | Status: AC
  Filled 2018-01-30: qty 10

## 2018-01-30 MED ORDER — LACTATED RINGERS IV SOLN
Freq: Once | INTRAVENOUS | Status: AC
  Administered 2018-01-30: 500 mL via INTRAVENOUS

## 2018-01-30 MED ORDER — METOCLOPRAMIDE HCL 5 MG PO TABS: 5.0000 mg | ORAL_TABLET | Freq: Three times a day (TID) | ORAL | Status: DC | PRN

## 2018-01-30 MED ORDER — MEPERIDINE HCL 50 MG/ML IJ SOLN: 6.2500 mg | INTRAMUSCULAR | Status: DC | PRN

## 2018-01-30 MED ORDER — CHLORHEXIDINE GLUCONATE 4 % EX LIQD: 60.0000 mL | Freq: Once | CUTANEOUS | Status: DC

## 2018-01-30 MED ORDER — ROCURONIUM BROMIDE 10 MG/ML (PF) SYRINGE
PREFILLED_SYRINGE | INTRAVENOUS | Status: AC
  Filled 2018-01-30: qty 5

## 2018-01-30 MED ORDER — DOCUSATE SODIUM 100 MG PO CAPS
100.0000 mg | ORAL_CAPSULE | Freq: Two times a day (BID) | ORAL | Status: DC
  Administered 2018-01-30: 100 mg via ORAL
  Filled 2018-01-30: qty 1

## 2018-01-30 MED ORDER — HEPARIN SODIUM (PORCINE) 5000 UNIT/ML IJ SOLN
5000.0000 [IU] | Freq: Three times a day (TID) | INTRAMUSCULAR | Status: DC
  Administered 2018-01-30: 5000 [IU] via SUBCUTANEOUS
  Filled 2018-01-30 (×2): qty 1

## 2018-01-30 MED ORDER — ACETAMINOPHEN 650 MG RE SUPP: 650.0000 mg | Freq: Four times a day (QID) | RECTAL | Status: DC | PRN

## 2018-01-30 MED ORDER — HYDROCODONE-ACETAMINOPHEN 5-325 MG PO TABS
1.0000 | ORAL_TABLET | ORAL | Status: DC | PRN
  Administered 2018-01-31: 04:00:00 1 via ORAL
  Filled 2018-01-30: qty 1

## 2018-01-30 MED ORDER — ONDANSETRON HCL 4 MG PO TABS: 4.0000 mg | ORAL_TABLET | Freq: Four times a day (QID) | ORAL | Status: DC | PRN

## 2018-01-30 MED ORDER — FENTANYL CITRATE (PF) 100 MCG/2ML IJ SOLN: 25.0000 ug | INTRAMUSCULAR | Status: DC | PRN

## 2018-01-30 MED ORDER — FUROSEMIDE 40 MG PO TABS: 40.0000 mg | ORAL_TABLET | Freq: Every day | ORAL | Status: DC

## 2018-01-30 MED ORDER — ONDANSETRON HCL 4 MG/2ML IJ SOLN
INTRAMUSCULAR | Status: DC | PRN
  Administered 2018-01-30: 4 mg via INTRAVENOUS

## 2018-01-30 MED ORDER — AMLODIPINE BESYLATE 5 MG PO TABS
5.0000 mg | ORAL_TABLET | Freq: Every day | ORAL | Status: DC
  Administered 2018-01-31 – 2018-02-01 (×2): 5 mg via ORAL
  Filled 2018-01-30 (×2): qty 1

## 2018-01-30 MED ORDER — MORPHINE SULFATE (PF) 2 MG/ML IV SOLN
0.5000 mg | INTRAVENOUS | Status: DC | PRN
  Administered 2018-01-31: 20:00:00 1 mg via INTRAVENOUS
  Filled 2018-01-30: qty 1

## 2018-01-30 MED ORDER — PHENYLEPHRINE HCL 10 MG/ML IJ SOLN
INTRAMUSCULAR | Status: DC | PRN
  Administered 2018-01-30 (×2): 80 ug via INTRAVENOUS

## 2018-01-30 MED ORDER — BUPIVACAINE IN DEXTROSE 0.75-8.25 % IT SOLN
INTRATHECAL | Status: DC | PRN
  Administered 2018-01-30: 1.5 mL via INTRATHECAL

## 2018-01-30 MED ORDER — METHIMAZOLE 5 MG PO TABS
5.0000 mg | ORAL_TABLET | Freq: Every day | ORAL | Status: DC
  Administered 2018-01-31 – 2018-02-01 (×2): 5 mg via ORAL
  Filled 2018-01-30 (×3): qty 1

## 2018-01-30 SURGICAL SUPPLY — 42 items
BAG ZIPLOCK 12X15 (MISCELLANEOUS) IMPLANT
BIT DRILL CANN LG 4.3MM (BIT) ×1 IMPLANT
BNDG GAUZE ELAST 4 BULKY (GAUZE/BANDAGES/DRESSINGS) ×2 IMPLANT
COVER PERINEAL POST (MISCELLANEOUS) ×2 IMPLANT
COVER SURGICAL LIGHT HANDLE (MISCELLANEOUS) ×2 IMPLANT
DERMABOND ADVANCED (GAUZE/BANDAGES/DRESSINGS) ×1
DERMABOND ADVANCED .7 DNX12 (GAUZE/BANDAGES/DRESSINGS) ×1 IMPLANT
DRAPE STERI IOBAN 125X83 (DRAPES) ×2 IMPLANT
DRILL BIT CANN LG 4.3MM (BIT) ×2
DRSG AQUACEL AG ADV 3.5X 4 (GAUZE/BANDAGES/DRESSINGS) IMPLANT
DRSG AQUACEL AG ADV 3.5X 6 (GAUZE/BANDAGES/DRESSINGS) ×2 IMPLANT
DURAPREP 26ML APPLICATOR (WOUND CARE) ×2 IMPLANT
ELECT REM PT RETURN 15FT ADLT (MISCELLANEOUS) ×2 IMPLANT
GAUZE SPONGE 4X4 12PLY STRL (GAUZE/BANDAGES/DRESSINGS) IMPLANT
GLOVE BIOGEL M 7.0 STRL (GLOVE) IMPLANT
GLOVE BIOGEL PI IND STRL 7.0 (GLOVE) ×1 IMPLANT
GLOVE BIOGEL PI IND STRL 7.5 (GLOVE) ×2 IMPLANT
GLOVE BIOGEL PI IND STRL 8.5 (GLOVE) IMPLANT
GLOVE BIOGEL PI INDICATOR 7.0 (GLOVE) ×1
GLOVE BIOGEL PI INDICATOR 7.5 (GLOVE) ×2
GLOVE BIOGEL PI INDICATOR 8.5 (GLOVE)
GLOVE ECLIPSE 7.5 STRL STRAW (GLOVE) ×2 IMPLANT
GLOVE ECLIPSE 8.0 STRL XLNG CF (GLOVE) IMPLANT
GLOVE ORTHO TXT STRL SZ7.5 (GLOVE) ×2 IMPLANT
GOWN STRL REUS W/TWL LRG LVL3 (GOWN DISPOSABLE) ×2 IMPLANT
GOWN STRL REUS W/TWL XL LVL3 (GOWN DISPOSABLE) ×4 IMPLANT
GUIDEPIN 3.2X17.5 THRD DISP (PIN) ×2 IMPLANT
KIT BASIN OR (CUSTOM PROCEDURE TRAY) ×2 IMPLANT
MANIFOLD NEPTUNE II (INSTRUMENTS) ×2 IMPLANT
NAIL HIP FRACT 130D 9X180 (Orthopedic Implant) ×2 IMPLANT
PACK GENERAL/GYN (CUSTOM PROCEDURE TRAY) ×2 IMPLANT
POSITIONER SURGICAL ARM (MISCELLANEOUS) ×2 IMPLANT
SCREW BONE CORTICAL 5.0X32 (Screw) ×2 IMPLANT
SCREW LAG 10.5MMX105MM HFN (Screw) ×2 IMPLANT
SUT MNCRL AB 4-0 PS2 18 (SUTURE) ×2 IMPLANT
SUT STRATAFIX PDS+ 0 24IN (SUTURE) ×2 IMPLANT
SUT VIC AB 1 CT1 27 (SUTURE) ×1
SUT VIC AB 1 CT1 27XBRD ANTBC (SUTURE) ×1 IMPLANT
SUT VIC AB 2-0 CT1 27 (SUTURE) ×3
SUT VIC AB 2-0 CT1 27XBRD (SUTURE) ×2 IMPLANT
SUT VIC AB 2-0 CT1 TAPERPNT 27 (SUTURE) ×1 IMPLANT
TOWEL OR 17X26 10 PK STRL BLUE (TOWEL DISPOSABLE) ×2 IMPLANT

## 2018-01-30 NOTE — Op Note (Signed)
Michelle Levine, Michelle Levine NO.:  [id number]  MEDICAL RECORD NO.:  [id number]  LOCATION:  WA18                         FACILITY:  [facility]  PHYSICIAN:  Michelle Levine, M.D.  DATE OF BIRTH:  26-Mar-1922  DATE OF PROCEDURE:  01/30/2018 DATE OF DISCHARGE:                              OPERATIVE REPORT   PREOPERATIVE DIAGNOSIS:  Comminuted left intertrochanteric femur fracture with findings of a greater trochanteric avulsion.  POSTOPERATIVE DIAGNOSIS:  Comminuted left intertrochanteric femur fracture with findings of a greater trochanteric avulsion.  PROCEDURE:  Open reduction and internal fixation of left intertrochanteric femur fracture utilizing a Biomet Affixus nail, 9 x 180 mm with 130-degree lag screw and a distal interlock.  SURGEON:  Michelle Levine, M.D.  ASSISTANTLowell Levine. Michelle Levine.  Note that Michelle Levine was present for the entirety of the case from preoperative positioning, perioperative management and assistance on the case as well as primary wound closure.  ANESTHESIA:  Spinal.  SPECIMENS:  None.  COMPLICATIONS:  None.  BLOOD LOSS:  Less than 100 cc.  INDICATION FOR PROCEDURE:  Michelle Levine is a pleasant 84 year old female who presented to the emergency room after a ground-level fall last night.  She was admitted to the Hospitalist Service.  Orthopedics was consulted for definitive management.  Risks, benefits and necessity of the procedure were discussed and reviewed.  Consent was obtained for benefit of fracture care.  PROCEDURE IN DETAIL:  The patient was brought to operative theater. Once adequate anesthesia, preoperative antibiotics, Ancef administered, the patient was positioned supine on the fracture table.  Her well, right lower extremity was flexed and abducted, and placed onto a leg holder with bony prominences padded.  Her left foot was placed in the traction boot.  Once protected, padded and positioned appropriately, traction and  internal rotation was applied and the fracture was reduced into a near anatomic position.  The left lower extremity was then prepped and draped in usual sterile fashion.  Time-out was performed identifying the patient, planned procedure and extremity.  Fluoroscopy was brought back to the field and tip of the trochanter identified.  Incision was then made proximal to this laterally.  Soft tissue dissection was carried down through the gluteal fascia.  Here, we identified significant comminution and avulsion of the greater trochanteric segment, which was too small piece to try to repair, I could not incorporate into the repair at all.  I thus passed the guidewire across the fracture and into the proximal femur and confirmed it radiographically.  Once this was done, the starting reamer was placed into the proximal femur to open it up.  I passed the 9-mm nail by hand to its appropriate depth.  Then using the guide, the guidewire was inserted into the center of the head in AP and lateral planes.  I measured and selected a 105-mm lag screw.  I drilled for this and then passed the screw.  Once it was appropriate depth, we let traction off the lower extremity and then used the compression wheel and medialized the shaft to the fracture site.  I then tightened down the proximal locking bolt and backed it off a quarter turn to allow  for further compression.  Once this was done, I placed a distal interlock measuring 32 mm using the insertion jig and guide.  The jig was then removed from the nail.  Final radiographs were obtained.  The wounds were irrigated with normal saline solution.  The proximal wound was closed in layers with #1 Vicryl in the gluteal fascia.  The remainder of the wound was closed with 2-0 Vicryl and subcutaneous Monocryl.  The wounds were then cleaned, dried and dressed sterilely using surgical glue and Aquacel dressing.  She was then brought to the recovery room in stable  condition tolerating the procedure well.  Findings were reviewed with the family.  We will allow her to be weightbearing as tolerated.     Michelle Cassis Alvan Levine, M.D.     MDO/MEDQ  D:  01/30/2018  T:  01/30/2018  Job:  [id number]

## 2018-01-30 NOTE — Progress Notes (Signed)
Paged [email or web address], NP to notify that pt has been drinking adequate amounts of fluid with no urine output since admission to unit. VSS. Awaiting response.

## 2018-01-30 NOTE — ED Notes (Signed)
ED TO INPATIENT HANDOFF REPORT  Name/Age/Gender Michelle Levine 84 y.o. female  Code Status Code Status History    Date Active Date Inactive Code Status Order ID Comments User Context   10/07/2016 2105 10/08/2016 1641 Full Code [id number]  Etta Quill, DO ED   03/11/2014 1012 03/12/2014 0334 Full Code [id number]  Markus Daft, MD HOV    Advance Directive Documentation     Most Recent Value  Type of Advance Directive  Healthcare Power of Attorney, Living will  Pre-existing out of facility DNR order (yellow form or pink MOST form)  -  "MOST" Form in Place?  -      Home/SNF/Other Home  Chief Complaint fall / hip pain   Level of Care/Admitting Diagnosis ED Disposition    ED Disposition Condition [place]: [facility] [100102]  Level of Care: Med-Surg [16]  Diagnosis: S/p left hip fracture [[id number]]  Admitting Physician: Merton Border Marshal.B[place]  Attending Physician: Laren Everts, [place]  Estimated length of stay: past midnight tomorrow  Certification:: I certify this patient will need inpatient services for at least 2 midnights  PT Class (Do Not Modify): Inpatient [101]  PT Acc Code (Do Not Modify): Private [1]       Medical History Past Medical History:  Diagnosis Date  . Aortic insufficiency   . CAD (coronary artery disease)   . Cancer ([place])   . Cataracts, bilateral   . CHF (congestive heart failure) ([place])   . COPD (chronic obstructive pulmonary disease) ([place])   . Hypertension   . Mitral insufficiency   . Pulmonary hypertension ([place])   . Thyroid disease   . Tricuspid regurgitation     Allergies No Known Allergies  IV Location/Drains/Wounds Patient Lines/Drains/Airways Status   Active Line/Drains/Airways    Name:   Placement date:   Placement time:   Site:   Days:   Peripheral IV 01/29/18 Left Antecubital   01/29/18    1848    Antecubital   1          Labs/Imaging Results for orders placed or performed during the  hospital encounter of 01/29/18 (from the past 48 hour(s))  Basic metabolic panel     Status: Abnormal   Collection Time: 01/29/18  7:56 PM  Result Value Ref Range   Sodium 141 135 - 145 mmol/L   Potassium 3.7 3.5 - 5.1 mmol/L   Chloride 107 101 - 111 mmol/L   CO2 25 22 - 32 mmol/L   Glucose, Bld 140 (H) 65 - 99 mg/dL   BUN 17 6 - 20 mg/dL   Creatinine, Ser 0.93 0.44 - 1.00 mg/dL   Calcium 8.8 (L) 8.9 - 10.3 mg/dL   GFR calc non Af Amer 51 (L) >60 mL/min   GFR calc Af Amer 60 (L) >60 mL/min    Comment: (NOTE) The eGFR has been calculated using the CKD EPI equation. This calculation has not been validated in all clinical situations. eGFR's persistently <60 mL/min signify possible Chronic Kidney Disease.    Anion gap 9 5 - 15    Comment: Performed at [facility], [place] [address] [place], [place] [address]  CBC WITH DIFFERENTIAL     Status: None   Collection Time: 01/29/18  7:56 PM  Result Value Ref Range   WBC 9.1 4.0 - 10.5 K/uL   RBC 4.19 3.87 - 5.11 MIL/uL   Hemoglobin 12.5 12.0 - 15.0 g/dL   HCT 38.3 36.0 -  46.0 %   MCV 91.4 78.0 - 100.0 fL   MCH 29.8 26.0 - 34.0 pg   MCHC 32.6 30.0 - 36.0 g/dL   RDW 13.9 11.5 - 15.5 %   Platelets 179 150 - 400 K/uL   Neutrophils Relative % 75 %   Neutro Abs 6.8 1.7 - 7.7 K/uL   Lymphocytes Relative 14 %   Lymphs Abs 1.3 0.7 - 4.0 K/uL   Monocytes Relative 9 %   Monocytes Absolute 0.8 0.1 - 1.0 K/uL   Eosinophils Relative 2 %   Eosinophils Absolute 0.2 0.0 - 0.7 K/uL   Basophils Relative 0 %   Basophils Absolute 0.0 0.0 - 0.1 K/uL    Comment: Performed at [facility], [place] [address], [place], [place] [address]  Protime-INR     Status: None   Collection Time: 01/29/18  7:56 PM  Result Value Ref Range   Prothrombin Time 14.1 11.4 - 15.2 seconds   INR 1.10     Comment: Performed at [facility], [place] [address] [place], [place] [address]  Type and screen [place]     Status: None (Preliminary result)   Collection Time: 01/29/18  7:56 PM  Result Value Ref Range   ABO/RH(D) A POS    Antibody Screen PENDING    Sample Expiration      02/01/2018 Performed at [facility], [place] [address] [place], [place] [address]    Ct Hip Left Wo Contrast  Result Date: 01/29/2018 CLINICAL DATA:  Comminuted left hip fracture, assessment for underlying lesion. EXAM: CT OF THE LEFT HIP WITHOUT CONTRAST TECHNIQUE: Multidetector CT imaging of the left hip was performed according to the standard protocol. Multiplanar CT image reconstructions were also generated. COMPARISON:  01/29/2018 FINDINGS: Bones/Joint/Cartilage Comminuted intertrochanteric fracture with multiple greater trochanteric fragments, a moderate-sized and mildly fragmented lesser trochanteric fragment likely containing a significant component of the calcar, and the dominant intertrochanteric fracture plane demonstrating varus angulation and some apex anterior angulation. There is hematoma along the fracture plane but I do not perceive a underlying lytic lesion or other lesion to suggest that this fracture is due to underlying pathologic malignancy. Ligaments Suboptimally assessed by CT. Muscles and Tendons Expected surrounding edema. There is gluteus minimus chronic atrophy. Soft tissues Expected hematoma around the fracture site. Other: Sigmoid colon diverticulosis. IMPRESSION: 1. No underlying lesion associated with the intertrochanteric fracture to suggest a pathologic fracture. 2. The intertrochanteric fracture has comminuted lesser and greater trochanteric components as well as varus angulation. Electronically Signed   By: Van Clines M.D.   On: 01/29/2018 21:33   Dg Chest Port 1 View  Result Date: 01/29/2018 CLINICAL DATA:  84 year old female preoperative study left femur fracture. History of right lung carcinoma status post radiation therapy. EXAM: PORTABLE CHEST 1 VIEW  COMPARISON:  Chest radiographs 10/07/2016 and earlier. Chest CT 02/21/2017. FINDINGS: Portable AP semi upright view at 1952 hours. Small bilateral pleural effusions in 2017 have resolved. Stable cardiomegaly and mediastinal contours. Large lung volumes. Vague chronic increased right paratracheal and medial right upper lung opacity appears stable since 2017 and is correlated to post radiation changes in the right upper lung on the 2018 CT. No pneumothorax, pulmonary edema or acute pulmonary opacity. No acute osseous abnormality identified in the chest IMPRESSION: 1.  No acute cardiopulmonary abnormality. 2. Chronic cardiomegaly, pulmonary hyperinflation, and right upper lobe radiation fibrosis. Electronically Signed   By: Genevie Ann M.D.   On:  01/29/2018 20:29   Dg Hip Unilat W Or Wo Pelvis 2-3 Views Left  Result Date: 01/29/2018 CLINICAL DATA:  Fall today with left hip pain. EXAM: DG HIP (WITH OR WITHOUT PELVIS) 2-3V LEFT COMPARISON:  None. FINDINGS: Intertrochanteric left hip fracture with mild comminution. There is enough underlying lucency that I recommend cross-sectional imaging such as CT in order to exclude an underlying lytic lesion/pathologic fracture. Clustered gallstones in the right abdomen. IMPRESSION: 1. Moderately comminuted intratrochanteric fracture on the left with mild varus angulation. There is enough underlying lucency that the possibility of a lytic lesion and pathologic fracture is raised, and CT should be considered to further characterize. 2. Cholelithiasis. Electronically Signed   By: Van Clines M.D.   On: 01/29/2018 19:59    Pending Labs Unresulted Labs (From admission, onward)   Start     Ordered   01/29/18 2039  ABO/Rh  Once,   R     01/29/18 2039      Vitals/Pain Today's Vitals   01/29/18 2200 01/29/18 2201 01/29/18 2345 01/29/18 2358  BP: (!) 154/86  115/82   Pulse: 88  89   Resp: (!) 25  (!) 24   SpO2: 95%  96%   PainSc:  2   2     Isolation  Precautions No active isolations  Medications Medications  fentaNYL (SUBLIMAZE) injection 50 mcg (50 mcg Intravenous Given 01/29/18 1927)  morphine 4 MG/ML injection 4 mg (4 mg Intravenous Given 01/29/18 2058)  fentaNYL (SUBLIMAZE) injection 50 mcg (50 mcg Intravenous Given 01/29/18 2147)    Mobility non-ambulatory, currently

## 2018-01-30 NOTE — Progress Notes (Signed)
Patient ID: Michelle Levine, female   DOB: 1922/05/25, 84 y.o.   MRN: [id number] I have spoken to one of my colleagues Dr. Alvan Dame who does have OR availability this afternoon.  He is agreed to see the patient and proceed to surgery earlier today.  I have communicated that with the patient and she seems very happy to hopefully have her left hip addressed sooner.

## 2018-01-30 NOTE — Plan of Care (Signed)
Problem: Education: Goal: Knowledge of General Education information will improve Outcome: Progressing   Problem: Health Behavior/Discharge Planning: Goal: Ability to manage health-related needs will improve Outcome: Progressing   Problem: Clinical Measurements: Goal: Ability to maintain clinical measurements within normal limits will improve Outcome: Progressing Goal: Will remain free from infection Outcome: Progressing Goal: Diagnostic test results will improve Outcome: Progressing Goal: Respiratory complications will improve Outcome: Progressing Goal: Cardiovascular complication will be avoided Outcome: Progressing   Problem: Activity: Goal: Risk for activity intolerance will decrease Outcome: Progressing   Problem: Nutrition: Goal: Adequate nutrition will be maintained Outcome: Progressing   Problem: Elimination: Goal: Will not experience complications related to bowel motility Outcome: Progressing Goal: Will not experience complications related to urinary retention Outcome: Progressing   Problem: Pain Managment: Goal: General experience of comfort will improve Outcome: Progressing   Problem: Safety: Goal: Ability to remain free from injury will improve Outcome: Progressing   Problem: Skin Integrity: Goal: Risk for impaired skin integrity will decrease Outcome: Progressing   Problem: Education: Goal: Verbalization of understanding the information provided (i.e., activity precautions, restrictions, etc) will improve Outcome: Progressing   Problem: Activity: Goal: Ability to ambulate and perform ADLs will improve Outcome: Progressing   Problem: Clinical Measurements: Goal: Postoperative complications will be avoided or minimized Outcome: Progressing   Problem: Self-Concept: Goal: Ability to maintain and perform role responsibilities to the fullest extent possible will improve Outcome: Progressing   Problem: Pain Management: Goal: Pain level will  decrease Outcome: Progressing   Problem: Education: Goal: Knowledge of General Education information will improve Outcome: Progressing   Problem: Health Behavior/Discharge Planning: Goal: Ability to manage health-related needs will improve Outcome: Progressing   Problem: Clinical Measurements: Goal: Ability to maintain clinical measurements within normal limits will improve Outcome: Progressing Goal: Will remain free from infection Outcome: Progressing Goal: Diagnostic test results will improve Outcome: Progressing Goal: Respiratory complications will improve Outcome: Progressing Goal: Cardiovascular complication will be avoided Outcome: Progressing   Problem: Activity: Goal: Risk for activity intolerance will decrease Outcome: Progressing   Problem: Nutrition: Goal: Adequate nutrition will be maintained Outcome: Progressing   Problem: Elimination: Goal: Will not experience complications related to bowel motility Outcome: Progressing Goal: Will not experience complications related to urinary retention Outcome: Progressing   Problem: Pain Managment: Goal: General experience of comfort will improve Outcome: Progressing   Problem: Safety: Goal: Ability to remain free from injury will improve Outcome: Progressing   Problem: Skin Integrity: Goal: Risk for impaired skin integrity will decrease Outcome: Progressing   Problem: Education: Goal: Verbalization of understanding the information provided (i.e., activity precautions, restrictions, etc) will improve Outcome: Progressing   Problem: Activity: Goal: Ability to ambulate and perform ADLs will improve Outcome: Progressing   Problem: Clinical Measurements: Goal: Postoperative complications will be avoided or minimized Outcome: Progressing   Problem: Self-Concept: Goal: Ability to maintain and perform role responsibilities to the fullest extent possible will improve Outcome: Progressing   Problem: Pain  Management: Goal: Pain level will decrease Outcome: Progressing   Problem: Education: Goal: Knowledge of General Education information will improve Outcome: Progressing   Problem: Health Behavior/Discharge Planning: Goal: Ability to manage health-related needs will improve Outcome: Progressing   Problem: Clinical Measurements: Goal: Ability to maintain clinical measurements within normal limits will improve Outcome: Progressing Goal: Will remain free from infection Outcome: Progressing Goal: Diagnostic test results will improve Outcome: Progressing Goal: Respiratory complications will improve Outcome: Progressing Goal: Cardiovascular complication will be avoided Outcome: Progressing   Problem: Activity: Goal: Risk for  activity intolerance will decrease Outcome: Progressing   Problem: Nutrition: Goal: Adequate nutrition will be maintained Outcome: Progressing   Problem: Elimination: Goal: Will not experience complications related to bowel motility Outcome: Progressing Goal: Will not experience complications related to urinary retention Outcome: Progressing   Problem: Pain Managment: Goal: General experience of comfort will improve Outcome: Progressing   Problem: Safety: Goal: Ability to remain free from injury will improve Outcome: Progressing   Problem: Skin Integrity: Goal: Risk for impaired skin integrity will decrease Outcome: Progressing   Problem: Education: Goal: Verbalization of understanding the information provided (i.e., activity precautions, restrictions, etc) will improve Outcome: Progressing   Problem: Activity: Goal: Ability to ambulate and perform ADLs will improve Outcome: Progressing   Problem: Clinical Measurements: Goal: Postoperative complications will be avoided or minimized Outcome: Progressing   Problem: Self-Concept: Goal: Ability to maintain and perform role responsibilities to the fullest extent possible will improve Outcome:  Progressing   Problem: Pain Management: Goal: Pain level will decrease Outcome: Progressing

## 2018-01-30 NOTE — Brief Op Note (Signed)
01/29/2018 - 01/30/2018  1:45 PM  PATIENT:  Michelle Levine  84 y.o. female  PRE-OPERATIVE DIAGNOSIS:  LEFT HIP intertrochanteric fracture  POST-OPERATIVE DIAGNOSIS:  LEFT HIP intertrochanteric fracture  PROCEDURE:  Procedure(s): INTRAMEDULLARY (IM) NAIL FEMORAL LEFT (Left)  SURGEON:  Surgeon(s) and Role:    Paralee Cancel, MD - Primary  PHYSICIAN ASSISTANT: Nehemiah Massed, PA-C  ANESTHESIA:   spinal  EBL:  50 mL   BLOOD ADMINISTERED:none  DRAINS: none   LOCAL MEDICATIONS USED:  NONE  SPECIMEN:  No Specimen  DISPOSITION OF SPECIMEN:  N/A  COUNTS:  YES  TOURNIQUET:  * No tourniquets in log *  DICTATION: .Other Dictation: Dictation Number [id number]  PLAN OF CARE: Admit to inpatient   PATIENT DISPOSITION:  PACU - hemodynamically stable.   Delay start of Pharmacological VTE agent (>24hrs) due to surgical blood loss or risk of bleeding: no

## 2018-01-30 NOTE — Progress Notes (Signed)
[facility] PROGRESS NOTE  Michelle Levine XNA:[id number] DOB: Apr 28, 1922 DOA: 01/29/2018 PCP: Crist Infante, MD  Brief summary   84 y.o. female, with past medical history significant for copd, hypertension, hyperthyroidism, right lung cancer status post radiation and being followed by Dr. Earlie Server presenting today after falling/tripping on the curb while going to her car.  Patient is usually ambulatory and lives independently.  X-ray of left hip showed intertrochanteric fracture and a questionable lytic lesion which was not confirmed by CT of the hip.  Patient denies any preceding chest pains or palpitations.  Assessment/Plan:  Left hip fracture, CT did not show any lytic lesions. Buck's traction. Orthopedics plans for surgical repair today. Patient has no acute cardiopulmonary symptoms at baseline, no focal neuro symptoms, no acute wheezing on exam. ecg with no acute ischemic changes   History of lung cancer and pulmonary hypertension status post radiation in the past being followed by Dr. Earlie Server.  History of hypertension continue with Zebeta  H/o Hyperthyroidism. Will check tsh   COPD. clinically stable. Cont prn bronchodilators as needed    Code Status: full Family Communication: d/w patient, no family at the bedside,.RN (indicate person spoken with, relationship, and if by phone, the number) Disposition Plan: pend surgery    Consultants:  ortho  Procedures:  Pend surgery   Antibiotics: Anti-infectives (From admission, onward)   None        (indicate start date, and stop date if known)  HPI/Subjective: Alert. Reports hip pain. Awaiting surgical repair. No acute cardiopulmonary symptoms. No focal neuro symptoms   Objective: Vitals:   01/30/18 0228 01/30/18 0625  BP: 120/71 134/78  Pulse: 98 89  Resp: (!) 22 20  Temp:  98.5 F (36.9 C)  SpO2: 99% 99%    Intake/Output Summary (Last 24 hours) at 01/30/2018 1111 Last data filed at 01/30/2018  0913 Gross per 24 hour  Intake 480 ml  Output 10 ml  Net 470 ml   There were no vitals filed for this visit.  Exam:   General:  No distress   Cardiovascular: s1,s2 rrr  Respiratory: CTA BL  Abdomen: soft, nt   Musculoskeletal: no leg edema    Data Reviewed: Basic Metabolic Panel: Recent Labs  Lab 01/29/18 1956  NA 141  K 3.7  CL 107  CO2 25  GLUCOSE 140*  BUN 17  CREATININE 0.93  CALCIUM 8.8*   Liver Function Tests: No results for input(s): AST, ALT, ALKPHOS, BILITOT, PROT, ALBUMIN in the last 168 hours. No results for input(s): LIPASE, AMYLASE in the last 168 hours. No results for input(s): AMMONIA in the last 168 hours. CBC: Recent Labs  Lab 01/29/18 1956  WBC 9.1  NEUTROABS 6.8  HGB 12.5  HCT 38.3  MCV 91.4  PLT 179   Cardiac Enzymes: No results for input(s): CKTOTAL, CKMB, CKMBINDEX, TROPONINI in the last 168 hours. BNP (last 3 results) No results for input(s): BNP in the last 8760 hours.  ProBNP (last 3 results) No results for input(s): PROBNP in the last 8760 hours.  CBG: Recent Labs  Lab 01/30/18 0723  GLUCAP 114*    No results found for this or any previous visit (from the past 240 hour(s)).   Studies: Ct Hip Left Wo Contrast  Result Date: 01/29/2018 CLINICAL DATA:  Comminuted left hip fracture, assessment for underlying lesion. EXAM: CT OF THE LEFT HIP WITHOUT CONTRAST TECHNIQUE: Multidetector CT imaging of the left hip was performed according to the standard protocol. Multiplanar CT image reconstructions  were also generated. COMPARISON:  01/29/2018 FINDINGS: Bones/Joint/Cartilage Comminuted intertrochanteric fracture with multiple greater trochanteric fragments, a moderate-sized and mildly fragmented lesser trochanteric fragment likely containing a significant component of the calcar, and the dominant intertrochanteric fracture plane demonstrating varus angulation and some apex anterior angulation. There is hematoma along the fracture  plane but I do not perceive a underlying lytic lesion or other lesion to suggest that this fracture is due to underlying pathologic malignancy. Ligaments Suboptimally assessed by CT. Muscles and Tendons Expected surrounding edema. There is gluteus minimus chronic atrophy. Soft tissues Expected hematoma around the fracture site. Other: Sigmoid colon diverticulosis. IMPRESSION: 1. No underlying lesion associated with the intertrochanteric fracture to suggest a pathologic fracture. 2. The intertrochanteric fracture has comminuted lesser and greater trochanteric components as well as varus angulation. Electronically Signed   By: Van Clines M.D.   On: 01/29/2018 21:33   Dg Chest Port 1 View  Result Date: 01/29/2018 CLINICAL DATA:  84 year old female preoperative study left femur fracture. History of right lung carcinoma status post radiation therapy. EXAM: PORTABLE CHEST 1 VIEW COMPARISON:  Chest radiographs 10/07/2016 and earlier. Chest CT 02/21/2017. FINDINGS: Portable AP semi upright view at 1952 hours. Small bilateral pleural effusions in 2017 have resolved. Stable cardiomegaly and mediastinal contours. Large lung volumes. Vague chronic increased right paratracheal and medial right upper lung opacity appears stable since 2017 and is correlated to post radiation changes in the right upper lung on the 2018 CT. No pneumothorax, pulmonary edema or acute pulmonary opacity. No acute osseous abnormality identified in the chest IMPRESSION: 1.  No acute cardiopulmonary abnormality. 2. Chronic cardiomegaly, pulmonary hyperinflation, and right upper lobe radiation fibrosis. Electronically Signed   By: Genevie Ann M.D.   On: 01/29/2018 20:29   Dg Hip Unilat W Or Wo Pelvis 2-3 Views Left  Result Date: 01/29/2018 CLINICAL DATA:  Fall today with left hip pain. EXAM: DG HIP (WITH OR WITHOUT PELVIS) 2-3V LEFT COMPARISON:  None. FINDINGS: Intertrochanteric left hip fracture with mild comminution. There is enough underlying  lucency that I recommend cross-sectional imaging such as CT in order to exclude an underlying lytic lesion/pathologic fracture. Clustered gallstones in the right abdomen. IMPRESSION: 1. Moderately comminuted intratrochanteric fracture on the left with mild varus angulation. There is enough underlying lucency that the possibility of a lytic lesion and pathologic fracture is raised, and CT should be considered to further characterize. 2. Cholelithiasis. Electronically Signed   By: Van Clines M.D.   On: 01/29/2018 19:59    Scheduled Meds: . amLODipine  5 mg Oral Daily  . bisoprolol  5 mg Oral Daily  . docusate sodium  100 mg Oral BID  . furosemide  40 mg Oral Daily  . heparin  5,000 Units Subcutaneous Q8H  . methimazole  5 mg Oral Daily  . multivitamin with minerals  1 tablet Oral Daily  . senna  1 tablet Oral BID  . sodium chloride flush  3 mL Intravenous Q12H   Continuous Infusions: . sodium chloride      Active Problems:   S/p left hip fracture    Time spent: >35 minutes     Kinnie Feil  [facility] Pager [phone]. If 7PM-7AM, please contact night-coverage at [email or web address], password [facility] 01/30/2018, 11:11 AM  LOS: 1 day

## 2018-01-30 NOTE — Consult Note (Signed)
Reason for Consult:  Left hip fracture Referring Physician: Dr. Tamera Punt, EDP  Michelle Levine is an 84 y.o. female.  HPI:   The patient is a very pleasant 84 year old female who sustained an accidental mechanical fall last evening injuring her left hip.  She is someone who ambulates independently and lives independently.  She denies any hip pain previously at all.  She does have a remote history of lung cancer.  After she fell directly on his hip she had severe left hip pain and inability to ambulate.  She is brought to the [facility] emergency room and found to have an intertrochanteric hip fracture.  There was concern that this may be pathologic and a CT scan was obtained of left hip and that showed no evidence of a pathologic lesion.  Again she denies any hip pain at all prior to this mechanical fall.  She is admitted to the medicine service.  Orthopedic surgery is consulted to address the left hip fracture.  She does complain only of left hip pain.  Past Medical History:  Diagnosis Date  . Aortic insufficiency   . CAD (coronary artery disease)   . Cancer ([place])   . Cataracts, bilateral   . CHF (congestive heart failure) ([place])   . COPD (chronic obstructive pulmonary disease) ([place])   . Hypertension   . Mitral insufficiency   . Pulmonary hypertension ([place])   . Thyroid disease   . Tricuspid regurgitation     Past Surgical History:  Procedure Laterality Date  . ABDOMINAL HYSTERECTOMY    . APPENDECTOMY    . BREAST LUMPECTOMY    . CATARACT EXTRACTION    . EYE SURGERY      Family History  Problem Relation Age of Onset  . Heart attack Mother 87  . Pneumonia Father 12  . Heart attack Brother   . Leukemia Brother   . Heart attack Brother   . Leukemia Brother     Social History:  reports that she has never smoked. She has never used smokeless tobacco. She reports that she does not drink alcohol or use drugs.  Allergies: No Known Allergies  Medications: I have reviewed the patient's  current medications.  Results for orders placed or performed during the hospital encounter of 01/29/18 (from the past 48 hour(s))  Basic metabolic panel     Status: Abnormal   Collection Time: 01/29/18  7:56 PM  Result Value Ref Range   Sodium 141 135 - 145 mmol/L   Potassium 3.7 3.5 - 5.1 mmol/L   Chloride 107 101 - 111 mmol/L   CO2 25 22 - 32 mmol/L   Glucose, Bld 140 (H) 65 - 99 mg/dL   BUN 17 6 - 20 mg/dL   Creatinine, Ser 0.93 0.44 - 1.00 mg/dL   Calcium 8.8 (L) 8.9 - 10.3 mg/dL   GFR calc non Af Amer 51 (L) >60 mL/min   GFR calc Af Amer 60 (L) >60 mL/min    Comment: (NOTE) The eGFR has been calculated using the CKD EPI equation. This calculation has not been validated in all clinical situations. eGFR's persistently <60 mL/min signify possible Chronic Kidney Disease.    Anion gap 9 5 - 15    Comment: Performed at [facility], [place] [address], [place], [place] [address]  CBC WITH DIFFERENTIAL     Status: None   Collection Time: 01/29/18  7:56 PM  Result Value Ref Range   WBC 9.1 4.0 - 10.5 K/uL   RBC  4.19 3.87 - 5.11 MIL/uL   Hemoglobin 12.5 12.0 - 15.0 g/dL   HCT 38.3 36.0 - 46.0 %   MCV 91.4 78.0 - 100.0 fL   MCH 29.8 26.0 - 34.0 pg   MCHC 32.6 30.0 - 36.0 g/dL   RDW 13.9 11.5 - 15.5 %   Platelets 179 150 - 400 K/uL   Neutrophils Relative % 75 %   Neutro Abs 6.8 1.7 - 7.7 K/uL   Lymphocytes Relative 14 %   Lymphs Abs 1.3 0.7 - 4.0 K/uL   Monocytes Relative 9 %   Monocytes Absolute 0.8 0.1 - 1.0 K/uL   Eosinophils Relative 2 %   Eosinophils Absolute 0.2 0.0 - 0.7 K/uL   Basophils Relative 0 %   Basophils Absolute 0.0 0.0 - 0.1 K/uL    Comment: Performed at [facility], [place] [address] [place], [place] [address]  Protime-INR     Status: None   Collection Time: 01/29/18  7:56 PM  Result Value Ref Range   Prothrombin Time 14.1 11.4 - 15.2 seconds   INR 1.10     Comment: Performed at [facility], [place]  [address] [place], [place] [address]  Type and screen [place]     Status: None   Collection Time: 01/29/18  7:56 PM  Result Value Ref Range   ABO/RH(D) A POS    Antibody Screen NEG    Sample Expiration      02/01/2018 Performed at [facility], [place] [address] [place], [place] [address]   ABO/Rh     Status: None   Collection Time: 01/29/18  8:39 PM  Result Value Ref Range   ABO/RH(D)      A POS Performed at [facility], [place] [address] [place], [place] [address]   Glucose, capillary     Status: Abnormal   Collection Time: 01/30/18  7:23 AM  Result Value Ref Range   Glucose-Capillary 114 (H) 65 - 99 mg/dL    Ct Hip Left Wo Contrast  Result Date: 01/29/2018 CLINICAL DATA:  Comminuted left hip fracture, assessment for underlying lesion. EXAM: CT OF THE LEFT HIP WITHOUT CONTRAST TECHNIQUE: Multidetector CT imaging of the left hip was performed according to the standard protocol. Multiplanar CT image reconstructions were also generated. COMPARISON:  01/29/2018 FINDINGS: Bones/Joint/Cartilage Comminuted intertrochanteric fracture with multiple greater trochanteric fragments, a moderate-sized and mildly fragmented lesser trochanteric fragment likely containing a significant component of the calcar, and the dominant intertrochanteric fracture plane demonstrating varus angulation and some apex anterior angulation. There is hematoma along the fracture plane but I do not perceive a underlying lytic lesion or other lesion to suggest that this fracture is due to underlying pathologic malignancy. Ligaments Suboptimally assessed by CT. Muscles and Tendons Expected surrounding edema. There is gluteus minimus chronic atrophy. Soft tissues Expected hematoma around the fracture site. Other: Sigmoid colon diverticulosis. IMPRESSION: 1. No underlying lesion associated with the intertrochanteric fracture to suggest a pathologic fracture. 2. The  intertrochanteric fracture has comminuted lesser and greater trochanteric components as well as varus angulation. Electronically Signed   By: Van Clines M.D.   On: 01/29/2018 21:33   Dg Chest Port 1 View  Result Date: 01/29/2018 CLINICAL DATA:  84 year old female preoperative study left femur fracture. History of right lung carcinoma status post radiation therapy. EXAM: PORTABLE CHEST 1 VIEW COMPARISON:  Chest radiographs 10/07/2016 and earlier. Chest CT 02/21/2017. FINDINGS: Portable AP semi upright view at 1952  hours. Small bilateral pleural effusions in 2017 have resolved. Stable cardiomegaly and mediastinal contours. Large lung volumes. Vague chronic increased right paratracheal and medial right upper lung opacity appears stable since 2017 and is correlated to post radiation changes in the right upper lung on the 2018 CT. No pneumothorax, pulmonary edema or acute pulmonary opacity. No acute osseous abnormality identified in the chest IMPRESSION: 1.  No acute cardiopulmonary abnormality. 2. Chronic cardiomegaly, pulmonary hyperinflation, and right upper lobe radiation fibrosis. Electronically Signed   By: Genevie Ann M.D.   On: 01/29/2018 20:29   Dg Hip Unilat W Or Wo Pelvis 2-3 Views Left  Result Date: 01/29/2018 CLINICAL DATA:  Fall today with left hip pain. EXAM: DG HIP (WITH OR WITHOUT PELVIS) 2-3V LEFT COMPARISON:  None. FINDINGS: Intertrochanteric left hip fracture with mild comminution. There is enough underlying lucency that I recommend cross-sectional imaging such as CT in order to exclude an underlying lytic lesion/pathologic fracture. Clustered gallstones in the right abdomen. IMPRESSION: 1. Moderately comminuted intratrochanteric fracture on the left with mild varus angulation. There is enough underlying lucency that the possibility of a lytic lesion and pathologic fracture is raised, and CT should be considered to further characterize. 2. Cholelithiasis. Electronically Signed   By: Van Clines M.D.   On: 01/29/2018 19:59    Review of Systems  All other systems reviewed and are negative.  Blood pressure 134/78, pulse 89, temperature 98.5 F (36.9 C), temperature source Oral, resp. rate 20, SpO2 99 %. Physical Exam  Constitutional: She is oriented to person, place, and time. She appears well-developed and well-nourished.  HENT:  Head: Normocephalic and atraumatic.  Eyes: Pupils are equal, round, and reactive to light. EOM are normal.  Neck: Normal range of motion. Neck supple.  Cardiovascular: Normal rate.  Respiratory: Effort normal and breath sounds normal.  GI: Soft. Bowel sounds are normal.  Musculoskeletal:       Left hip: She exhibits decreased range of motion, decreased strength, tenderness, bony tenderness and deformity.  Neurological: She is alert and oriented to person, place, and time.  Skin: Skin is warm and dry.  Psychiatric: She has a normal mood and affect.    Assessment/Plan: Left hip with displaced intertrochanteric proximal femur/hip fracture  The plan will be to proceed to the operating room late today for stabilization of her left intertrochanteric hip fracture using an intramedullary nail and hip screw construct.  This will hopefully allow for early mobilization.  I had a long and thorough discussion with her about this as well as discussion of the risks and benefits of surgery.  Mcarthur Rossetti 01/30/2018, 7:40 AM

## 2018-01-30 NOTE — Progress Notes (Signed)
Patient ID: Michelle Levine, female   DOB: 1922/02/07, 84 y.o.   MRN: [id number] Very pleasant female with left IT femur fracture  Seen and examined this am after being asked to help with surgical management  LLE, NVI, short and externally rotated, pain with movement  Assessment: Left intertrochanteric proximal femur fracture  Plan: NPO Plan for OR this pm Reviewed procedure and goals, risks and benefits Discussed post-op expectation She is ready to proceed

## 2018-01-30 NOTE — Anesthesia Procedure Notes (Signed)
Spinal  Patient location during procedure: OR Staffing Anesthesiologist: Montez Hageman, MD Performed: anesthesiologist  Preanesthetic Checklist Completed: patient identified, site marked, surgical consent, pre-op evaluation, timeout performed, IV checked, risks and benefits discussed and monitors and equipment checked Spinal Block Patient position: right lateral decubitus Prep: Betadine Patient monitoring: heart rate, continuous pulse ox and blood pressure Approach: left paramedian Location: L3-4 Injection technique: single-shot Needle Needle type: Spinocan  Needle gauge: 22 G Needle length: 9 cm Additional Notes Expiration date of kit checked and confirmed. Patient tolerated procedure well, without complications.

## 2018-01-30 NOTE — Anesthesia Postprocedure Evaluation (Signed)
Anesthesia Post Note  Patient: Michelle Levine  Procedure(s) Performed: INTRAMEDULLARY (IM) NAIL FEMORAL LEFT (Left )     Patient location during evaluation: PACU Anesthesia Type: Spinal Level of consciousness: awake and alert Pain management: pain level controlled Vital Signs Assessment: post-procedure vital signs reviewed and stable Respiratory status: spontaneous breathing and respiratory function stable Cardiovascular status: blood pressure returned to baseline and stable Postop Assessment: no headache, no backache, spinal receding and no apparent nausea or vomiting Anesthetic complications: no    Last Vitals:  Vitals:   01/30/18 1645 01/30/18 1650  BP: (!) 112/94 127/70  Pulse: 100 100  Resp: (!) 28 (!) 21  Temp:    SpO2: 90% 93%    Last Pain:  Vitals:   01/30/18 1645  TempSrc:   PainSc: 0-No pain                 Montez Hageman

## 2018-01-30 NOTE — Transfer of Care (Signed)
Immediate Anesthesia Transfer of Care Note  Patient: Michelle Levine  Procedure(s) Performed: INTRAMEDULLARY (IM) NAIL FEMORAL LEFT (Left )  Patient Location: PACU  Anesthesia Type:Spinal  Level of Consciousness: awake, alert  and oriented  Airway & Oxygen Therapy: Patient Spontanous Breathing and Patient connected to face mask oxygen  Post-op Assessment: Report given to RN and Post -op Vital signs reviewed and stable  Post vital signs: Reviewed and stable  Last Vitals:  Vitals Value Taken Time  BP 91/64 01/30/2018  2:25 PM  Temp 36.5 C 01/30/2018  2:23 PM  Pulse 86 01/30/2018  2:26 PM  Resp 13 01/30/2018  2:26 PM  SpO2 100 % 01/30/2018  2:26 PM  Vitals shown include unvalidated device data.  Last Pain:  Vitals:   01/30/18 0928  TempSrc:   PainSc: 3       Patients Stated Pain Goal: 5 (09/32/67 1245)  Complications: No apparent anesthesia complications

## 2018-01-30 NOTE — Anesthesia Preprocedure Evaluation (Signed)
Anesthesia Evaluation  Patient identified by MRN, date of birth, ID band Patient awake    Reviewed: Allergy & Precautions, NPO status , Patient's Chart, lab work & pertinent test results  Airway Mallampati: II  TM Distance: >3 FB Neck ROM: Full    Dental no notable dental hx. (+) Poor Dentition   Pulmonary neg pulmonary ROS,    Pulmonary exam normal breath sounds clear to auscultation       Cardiovascular hypertension, Pt. on medications negative cardio ROS Normal cardiovascular exam+ Valvular Problems/Murmurs AI  Rhythm:Regular Rate:Normal     Neuro/Psych negative neurological ROS  negative psych ROS   GI/Hepatic negative GI ROS, Neg liver ROS,   Endo/Other  negative endocrine ROS  Renal/GU negative Renal ROS  negative genitourinary   Musculoskeletal negative musculoskeletal ROS (+)   Abdominal   Peds negative pediatric ROS (+)  Hematology negative hematology ROS (+)   Anesthesia Other Findings   Reproductive/Obstetrics negative OB ROS                             Anesthesia Physical Anesthesia Plan  ASA: II  Anesthesia Plan: Spinal   Post-op Pain Management:    Induction:   PONV Risk Score and Plan: 2 and Ondansetron and Treatment may vary due to age or medical condition  Airway Management Planned: Simple Face Mask  Additional Equipment:   Intra-op Plan:   Post-operative Plan:   Informed Consent: I have reviewed the patients History and Physical, chart, labs and discussed the procedure including the risks, benefits and alternatives for the proposed anesthesia with the patient or authorized representative who has indicated his/her understanding and acceptance.   Dental advisory given  Plan Discussed with:   Anesthesia Plan Comments:         Anesthesia Quick Evaluation

## 2018-01-31 ENCOUNTER — Inpatient Hospital Stay (HOSPITAL_COMMUNITY): Payer: Medicare Other

## 2018-01-31 DIAGNOSIS — J449 Chronic obstructive pulmonary disease, unspecified: Secondary | ICD-10-CM

## 2018-01-31 DIAGNOSIS — D62 Acute posthemorrhagic anemia: Secondary | ICD-10-CM

## 2018-01-31 DIAGNOSIS — E871 Hypo-osmolality and hyponatremia: Secondary | ICD-10-CM

## 2018-01-31 DIAGNOSIS — I10 Essential (primary) hypertension: Secondary | ICD-10-CM

## 2018-01-31 DIAGNOSIS — S72002A Fracture of unspecified part of neck of left femur, initial encounter for closed fracture: Secondary | ICD-10-CM

## 2018-01-31 DIAGNOSIS — D696 Thrombocytopenia, unspecified: Secondary | ICD-10-CM

## 2018-01-31 DIAGNOSIS — I509 Heart failure, unspecified: Secondary | ICD-10-CM

## 2018-01-31 DIAGNOSIS — M25552 Pain in left hip: Secondary | ICD-10-CM

## 2018-01-31 DIAGNOSIS — D72829 Elevated white blood cell count, unspecified: Secondary | ICD-10-CM

## 2018-01-31 DIAGNOSIS — Z8781 Personal history of (healed) traumatic fracture: Secondary | ICD-10-CM

## 2018-01-31 DIAGNOSIS — I272 Pulmonary hypertension, unspecified: Secondary | ICD-10-CM

## 2018-01-31 LAB — BASIC METABOLIC PANEL
Anion gap: 6 (ref 5–15)
BUN: 20 mg/dL (ref 6–20)
CALCIUM: 8.6 mg/dL — AB (ref 8.9–10.3)
CO2: 26 mmol/L (ref 22–32)
CREATININE: 0.88 mg/dL (ref 0.44–1.00)
Chloride: 101 mmol/L (ref 101–111)
GFR, EST NON AFRICAN AMERICAN: 55 mL/min — AB (ref >60)
GLUCOSE: 140 mg/dL — AB (ref 65–99)
Potassium: 3.7 mmol/L (ref 3.5–5.1)
Sodium: 133 mmol/L — ABNORMAL LOW (ref 135–145)

## 2018-01-31 LAB — CBC
HEMATOCRIT: 28.1 % — AB (ref 36.0–46.0)
Hemoglobin: 9.3 g/dL — ABNORMAL LOW (ref 12.0–15.0)
MCH: 30.7 pg (ref 26.0–34.0)
MCHC: 33.1 g/dL (ref 30.0–36.0)
MCV: 92.7 fL (ref 78.0–100.0)
PLATELETS: 140 10*3/uL — AB (ref 150–400)
RBC: 3.03 MIL/uL — ABNORMAL LOW (ref 3.87–5.11)
RDW: 14.3 % (ref 11.5–15.5)
WBC: 14.2 10*3/uL — ABNORMAL HIGH (ref 4.0–10.5)

## 2018-01-31 MED ORDER — FERROUS SULFATE 325 (65 FE) MG PO TABS: 325.0000 mg | ORAL_TABLET | Freq: Three times a day (TID) | ORAL | 3 refills | Status: AC

## 2018-01-31 MED ORDER — HYDROCODONE-ACETAMINOPHEN 5-325 MG PO TABS: 1.0000 | ORAL_TABLET | ORAL | 0 refills | Status: AC | PRN

## 2018-01-31 MED ORDER — ASPIRIN 325 MG PO TBEC: 325.0000 mg | DELAYED_RELEASE_TABLET | Freq: Two times a day (BID) | ORAL | 0 refills | Status: DC

## 2018-01-31 MED ORDER — SODIUM CHLORIDE 0.9 % IV SOLN: INTRAVENOUS | Status: DC

## 2018-01-31 MED ORDER — DOCUSATE SODIUM 100 MG PO CAPS: 100.0000 mg | ORAL_CAPSULE | Freq: Two times a day (BID) | ORAL | 0 refills | Status: AC

## 2018-01-31 MED ORDER — SODIUM CHLORIDE 0.9 % IV SOLN
INTRAVENOUS | Status: AC
  Administered 2018-01-31: 11:00:00 via INTRAVENOUS
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
## 2018-01-31 NOTE — Progress Notes (Signed)
     Subjective: 1 Day Post-Op Procedure(s) (LRB): INTRAMEDULLARY (IM) NAIL FEMORAL LEFT (Left)   Patient reports pain as mild, states that she feels better than prior to surgery. Feels that she is doing well.  Plans on going to SNF upon discharge.  Objective:   VITALS:   Vitals:   01/31/18 0504 01/31/18 0608  BP: 140/70   Pulse: 99 95  Resp: 18 18  Temp: 98 F (36.7 C)   SpO2: 98% 96%    Dorsiflexion/Plantar flexion intact Incision: dressing C/D/I No cellulitis present Compartment soft  LABS Recent Labs    01/29/18 1956 01/31/18 0550  HGB 12.5 9.3*  HCT 38.3 28.1*  WBC 9.1 14.2*  PLT 179 140*    Recent Labs    01/29/18 1956 01/31/18 0550  NA 141 133*  K 3.7 3.7  BUN 17 20  CREATININE 0.93 0.88  GLUCOSE 140* 140*     Assessment/Plan: 1 Day Post-Op Procedure(s) (LRB): INTRAMEDULLARY (IM) NAIL FEMORAL LEFT (Left) Up with therapy Discharge to SNF probably upon d/c.  Ortho recommendations:  ASA 325 mg bid for 4 weeks for anticoagulation, unless other medically indicated.  Norco for pain management (Rx written).  MiraLax and Colace for constipation  Iron 325 mg tid for 2-3 weeks   WBAT on the left leg.  Dressing to remain in place until follow in clinic in 2 weeks.  Dressing is waterproof and may shower with it in place.  Follow up in 2 weeks at [facility]. Follow up with OLIN,Renaldo Gornick D in 2 weeks.  Contact information:  [facility] [address], [address] [place] Petrow   PAC  01/31/2018, 7:53 AM

## 2018-01-31 NOTE — Clinical Social Work Placement (Addendum)
D/C summary sent.  Nurse given number to call report.   CLINICAL SOCIAL WORK PLACEMENT  NOTE  Date:  01/31/2018  Patient Details  Name: Michelle Levine MRN: [id number] Date of Birth: 12-17-1921  Clinical Social Work is seeking post-discharge placement for this patient at the [place] level of care (*CSW will initial, date and re-position this form in  chart as items are completed):  Yes   Patient/family provided with [place] Work Department's list of facilities offering this level of care within the geographic area requested by the patient (or if unable, by the patient's family).  Yes   Patient/family informed of their freedom to choose among providers that offer the needed level of care, that participate in Medicare, Medicaid or managed care program needed by the patient, have an available bed and are willing to accept the patient.  Yes   Patient/family informed of [place]'s ownership interest in [facility] and [facility], as well as of the fact that they are under no obligation to receive care at these facilities.  PASRR submitted to EDS on 01/31/18     PASRR number received on 01/31/18     Existing PASRR number confirmed on       FL2 transmitted to all facilities in geographic area requested by pt/family on       FL2 transmitted to all facilities within larger geographic area on 01/31/18     Patient informed that his/her managed care company has contracts with or will negotiate with certain facilities, including the following:  [facility]     No   Patient/family informed of bed offers received.  Patient chooses bed at [facility]     Physician recommends and patient chooses bed at     [facility] Patient to be transferred to   on  .3/28  Patient to be transferred to facility by     [place]  Patient family notified on   of transfer. 3/28  Name of family member notified:      Alveda Reasons   PHYSICIAN        Additional Comment:    _______________________________________________ Lia Hopping, LCSW 01/31/2018, 12:08 PM

## 2018-01-31 NOTE — NC FL2 (Signed)
[place] LEVEL OF CARE SCREENING TOOL     IDENTIFICATION  Patient Name: Michelle Levine Birthdate: 1922-05-12 Sex: female Admission Date (Current Location): 01/29/2018  [facility] and [place] Number:  Herbalist and Address:  [facility],  [place], [place]      Provider Number: [id number]  Attending Physician Name and Address:  Kerney Elbe, DO  Relative Name and Phone Number:       Current Level of Care: Hospital Recommended Level of Care: [place] Prior Approval Number:    Date Approved/Denied:   PASRR Number: [id number]  Discharge Plan: SNF    Current Diagnoses: Patient Active Problem List   Diagnosis Date Noted  . S/p left hip fracture 01/29/2018  . Sternal fracture 10/07/2016  . Acute asthmatic bronchitis 09/05/2016  . Radiation pneumonitis ([place]) 10/13/2015  . Chronic cough 07/03/2014  . Bronchogenic cancer of right lung ([place]) 03/20/2014  . Multiple lung nodules 11/18/2012  . Aortic insufficiency   . Mitral insufficiency   . Mild tricuspid regurgitation   . Pulmonary hypertension ([place])   . CHF (congestive heart failure) ([place])   . COPD mixed type ([place])   . Essential hypertension     Orientation RESPIRATION BLADDER Height & Weight     Self, Situation, Place, Time  Normal Continent Weight: 117 lb (53.1 kg) Height:  5\' 5"  (165.1 cm)  BEHAVIORAL SYMPTOMS/MOOD NEUROLOGICAL BOWEL NUTRITION STATUS      Continent Diet(Regular)  AMBULATORY STATUS COMMUNICATION OF NEEDS Skin   Extensive Assist Verbally (Incision-Left Hip)                       Personal Care Assistance Level of Assistance  Bathing, Feeding, Dressing Bathing Assistance: Limited assistance Feeding assistance: Independent Dressing Assistance: Limited assistance     Functional Limitations Info  Sight, Hearing, Speech Sight Info: Impaired Hearing Info: Impaired Speech Info: Adequate    SPECIAL CARE FACTORS  FREQUENCY  PT (By licensed PT), OT (By licensed OT)     PT Frequency: 7x/week OT Frequency: 7x/week            Contractures Contractures Info: Not present    Additional Factors Info  Allergies Code Status Info: Fullcode  Allergies Info: Allergies: No Known Allergies           Current Medications (01/31/2018):  This is the current hospital active medication list Current Facility-Administered Medications  Medication Dose Route Frequency Provider Last Rate Last Dose  . 0.9 %  sodium chloride infusion  250 mL Intravenous PRN Danae Orleans, PA-C      . 0.9 %  sodium chloride infusion   Intravenous Continuous Raiford Noble [place], DO 50 mL/hr at 01/31/18 1115    . acetaminophen (TYLENOL) suppository 650 mg  650 mg Rectal Q6H PRN Danae Orleans, PA-C      . acetaminophen (TYLENOL) tablet 325-650 mg  325-650 mg Oral Q6H PRN Babish, Matthew, PA-C      . amLODipine (NORVASC) tablet 5 mg  5 mg Oral Daily Danae Orleans, PA-C   5 mg at 01/31/18 1009  . aspirin EC tablet 325 mg  325 mg Oral BID Danae Orleans, PA-C   325 mg at 01/31/18 2025  . bisoprolol (ZEBETA) tablet 5 mg  5 mg Oral Daily Danae Orleans, PA-C   5 mg at 01/31/18 1009  . celecoxib (CELEBREX) capsule 200 mg  200 mg Oral BID Babish, Matthew, PA-C   200 mg at  01/31/18 1009  . docusate sodium (COLACE) capsule 100 mg  100 mg Oral BID Danae Orleans, PA-C   100 mg at 01/31/18 1009  . ferrous sulfate tablet 325 mg  325 mg Oral TID PC Danae Orleans, PA-C   325 mg at 01/31/18 1009  . HYDROcodone-acetaminophen (NORCO) 7.5-325 MG per tablet 1-2 tablet  1-2 tablet Oral Q4H PRN Danae Orleans, PA-C   1 tablet at 01/30/18 1850  . HYDROcodone-acetaminophen (NORCO/VICODIN) 5-325 MG per tablet 1-2 tablet  1-2 tablet Oral Q4H PRN Danae Orleans, PA-C   1 tablet at 01/31/18 0400  . ipratropium-albuterol (DUONEB) 0.5-2.5 (3) MG/3ML nebulizer solution 3 mL  3 mL Nebulization Q6H PRN Babish, Matthew, PA-C      . lactated ringers  infusion   Intravenous Continuous Danae Orleans, PA-C 20 mL/hr at 01/30/18 1201    . menthol-cetylpyridinium (CEPACOL) lozenge 3 mg  1 lozenge Oral PRN Danae Orleans, PA-C       Or  . phenol (CHLORASEPTIC) mouth spray 1 spray  1 spray Mouth/Throat PRN Danae Orleans, PA-C      . methimazole (TAPAZOLE) tablet 5 mg  5 mg Oral Daily Danae Orleans, PA-C   5 mg at 01/31/18 1009  . metoCLOPramide (REGLAN) tablet 5-10 mg  5-10 mg Oral Q8H PRN Danae Orleans, PA-C       Or  . metoCLOPramide (REGLAN) injection 5-10 mg  5-10 mg Intravenous Q8H PRN Danae Orleans, PA-C      . morphine 2 MG/ML injection 0.5-1 mg  0.5-1 mg Intravenous Q2H PRN Babish, Matthew, PA-C      . multivitamin with minerals tablet 1 tablet  1 tablet Oral Daily Danae Orleans, PA-C   1 tablet at 01/31/18 1009  . ondansetron (ZOFRAN) tablet 4 mg  4 mg Oral Q6H PRN Danae Orleans, PA-C       Or  . ondansetron [facility]) injection 4 mg  4 mg Intravenous Q6H PRN Babish, Matthew, PA-C      . senna (SENOKOT) tablet 8.6 mg  1 tablet Oral BID Babish, Matthew, PA-C   8.6 mg at 01/31/18 1009  . sodium chloride flush (NS) 0.9 % injection 3 mL  3 mL Intravenous Q12H Danae Orleans, PA-C   3 mL at 01/31/18 1032  . sodium chloride flush (NS) 0.9 % injection 3 mL  3 mL Intravenous PRN Babish, Matthew, PA-C      . zolpidem (AMBIEN) tablet 5 mg  5 mg Oral QHS PRN Danae Orleans, PA-C         Discharge Medications: Please see discharge summary for a list of discharge medications.  Relevant Imaging Results:  Relevant Lab Results:   Additional Information [phone]  Lia Hopping, LCSW

## 2018-01-31 NOTE — Progress Notes (Signed)
Plan for d/c to SNF, discharge planning per CSW. [phone] 

## 2018-01-31 NOTE — Progress Notes (Signed)
PROGRESS NOTE    Michelle Levine  ESP:[id number] DOB: May 08, 1922 DOA: 01/29/2018 PCP: Crist Infante, MD  Brief Narrative:  The patient is a 84 y.o.female,with past medical history significant forcopd, hypertension, hyperthyroidism, rightlung cancerstatus post radiation and being followed by Dr. Earlie Server who presented  after falling/tripping on the curb while going to her car. Patient is usually ambulatory and lives independently. X-ray of left hip showed intertrochanteric fracture and a questionable lytic lesion which was not confirmed by CT of the hip. She underwent ORIF and is doing well. PT recommending SNF.   Assessment & Plan:   Active Problems:   Pulmonary hypertension (HCC)   CHF (congestive heart failure) (HCC)   COPD mixed type (HCC)   Essential hypertension   S/p left hip fracture   Thrombocytopenia (HCC)   Leukocytosis   Acute blood loss as cause of postoperative anemia   Hyponatremia  Comminuted Intertochanteric Left hip/Femur fracture s/p ORIF POD 1 -CT did not show any lytic lesions. -Orthopedics did surgical repair yesterday -Pain Control Per Orthopedics with [facility] and they Recommend WBAT on Left Leg -C/w DVT Prophylaxis with ASA 325 mg po BID per Ortho  -Dressing to remain in place until follow up in Clinic in 2 weeks with Dr. Alvan Dame   History of lung cancer and pulmonary hypertension  -Status post radiation in the past being followed by Dr. Earlie Server.  Leukocytosis -Likely Reactive 2/2 to Surgery -WBC went from 9.1 -> 14.2 -Continue to Monitor for S/Sx of Infection -Repeat CBC in AM   Thrombocytopenia -Likely reactive. Platelet Count went from 179 -> 140 -Continue to Monitor for S/Sx of Bleeding -Repeat CBC in AM  Acute Blood Loss Anemia -Expected Post-Operative Surgical Drop -Hb/Hct went from 12.5/38.3 -> 9.3/28.1 -C/w Ferrous Sulfate 325 mg po TIDwm and Docusate 100 mg po BID for Constipation associated  -Continue to Monitor for S/Sx of  Bleeding -Repeat CBC in AM   Hyponatremia -Likely from Dehydration from NPO Status -Started Gentle IVF Rehydration with NS 50 mL/hr -Continue to Monitor and Repeat CMP in AM   Hypertension  -Continue with Amlodipine 5 mg po Daily and Bisoprolol 5 mg po Daily   Hyperthyroidism -TSH was 1.451 -C/w Methimazole 5 mg po Daily   COPD -Clinically stable.  -C/w DuoNeb q6hprn Wheezing/SOB  CAD -C/w ASA 325 mg po BID per Orthopedics   CHF -Currently not Decompensated -Continue to Monitor Volume Status Extremely closely not that patient is on IVF at 50 mL/hr -Hold Home Lasix today and resume tomorrow -Patient is +2.677 Liters since Admission   DVT prophylaxis: Per Ortho ASA 325 mg po BID x 4 weeks Code Status: FULL CODE Family Communication: Discussed with Son at bedside  Disposition Plan: Anticipate SNF Discharge in next 24-48 hours   Consultants:   Orthopedic Surgery Dr. Norva Pavlov   Procedures: ORIF Left Hip on 01/30/18   Antimicrobials:  Anti-infectives (From admission, onward)   Start     Dose/Rate Route Frequency Ordered Stop   01/30/18 1900  ceFAZolin (ANCEF) IVPB 2g/100 mL premix     2 g 200 mL/hr over 30 Minutes Intravenous Every 6 hours 01/30/18 1720 01/31/18 0122   01/30/18 1200  ceFAZolin (ANCEF) IVPB 2g/100 mL premix     2 g 200 mL/hr over 30 Minutes Intravenous On call to O.R. 01/30/18 1151 01/30/18 1308     Subjective: Seen and examined and pain was controlled. No Nausea or vomiting. No lightheadedness or dizziness. Feels ok. Wanting to work with therapy.  Objective: Vitals:   01/31/18 0448 01/31/18 0504 01/31/18 0608 01/31/18 1359  BP:  140/70  114/62  Pulse: 97 99 95 76  Resp: 16 18 18 20   Temp:  98 F (36.7 C)  97.8 F (36.6 C)  TempSrc:  Oral  Oral  SpO2: 98% 98% 96% 95%  Weight:      Height:        Intake/Output Summary (Last 24 hours) at 01/31/2018 1807 Last data filed at 01/31/2018 1659 Gross per 24 hour  Intake 1490.35 ml  Output  600 ml  Net 890.35 ml   Filed Weights   01/30/18 1850  Weight: 53.1 kg (117 lb)   Examination: Physical Exam:  Constitutional: Thin frail elderly appearing Caucasian female in NAD and appears calm and comfortable Eyes: Lids and conjunctivae normal, sclerae anicteric  ENMT: External Ears, Nose appear normal. Grossly normal hearing. Mucous membranes are slightly dry..  Neck: Appears normal, supple, no cervical masses, normal ROM, no appreciable thyromegaly, no JVD Respiratory: Diminished to auscultation bilaterally, no wheezing, rales, rhonchi or crackles. Normal respiratory effort and patient is not tachypenic. No accessory muscle use.  Cardiovascular: RRR, Slight murmur. S1 and S2 auscultated. No extremity edema.  Abdomen: Soft, non-tender, non-distended. No masses palpated. No appreciable hepatosplenomegaly. Bowel sounds positive.  GU: Deferred. Musculoskeletal: No clubbing / cyanosis of digits/nails. No joint deformity upper and lower extremities  Skin: No rashes, lesions, ulcers. Incision appeared C/D/I. No induration; Warm and dry.  Neurologic: CN 2-12 grossly intact with no focal deficits. Romberg sign and cerebellar reflexes not assessed.  Psychiatric: Normal judgment and insight. Alert and oriented x 3. Normal mood and appropriate affect.   Data Reviewed: I have personally reviewed following labs and imaging studies  CBC: Recent Labs  Lab 01/29/18 1956 01/31/18 0550  WBC 9.1 14.2*  NEUTROABS 6.8  --   HGB 12.5 9.3*  HCT 38.3 28.1*  MCV 91.4 92.7  PLT 179 510*   Basic Metabolic Panel: Recent Labs  Lab 01/29/18 1956 01/31/18 0550  NA 141 133*  K 3.7 3.7  CL 107 101  CO2 25 26  GLUCOSE 140* 140*  BUN 17 20  CREATININE 0.93 0.88  CALCIUM 8.8* 8.6*   GFR: Estimated Creatinine Clearance: 33.5 mL/min (by C-G formula based on SCr of 0.88 mg/dL). Liver Function Tests: No results for input(s): AST, ALT, ALKPHOS, BILITOT, PROT, ALBUMIN in the last 168 hours. No  results for input(s): LIPASE, AMYLASE in the last 168 hours. No results for input(s): AMMONIA in the last 168 hours. Coagulation Profile: Recent Labs  Lab 01/29/18 1956  INR 1.10   Cardiac Enzymes: No results for input(s): CKTOTAL, CKMB, CKMBINDEX, TROPONINI in the last 168 hours. BNP (last 3 results) No results for input(s): PROBNP in the last 8760 hours. HbA1C: No results for input(s): HGBA1C in the last 72 hours. CBG: Recent Labs  Lab 01/30/18 0723  GLUCAP 114*   Lipid Profile: No results for input(s): CHOL, HDL, LDLCALC, TRIG, CHOLHDL, LDLDIRECT in the last 72 hours. Thyroid Function Tests: Recent Labs    01/30/18 1641  TSH 1.451   Anemia Panel: No results for input(s): VITAMINB12, FOLATE, FERRITIN, TIBC, IRON, RETICCTPCT in the last 72 hours. Sepsis Labs: No results for input(s): PROCALCITON, LATICACIDVEN in the last 168 hours.  Recent Results (from the past 240 hour(s))  Surgical pcr screen     Status: None   Collection Time: 01/30/18 11:10 AM  Result Value Ref Range Status   MRSA, PCR NEGATIVE NEGATIVE Final  Staphylococcus aureus NEGATIVE NEGATIVE Final    Comment: (NOTE) The Xpert SA Assay (FDA approved for NASAL specimens in patients 87 years of age and older), is one component of a comprehensive surveillance program. It is not intended to diagnose infection nor to guide or monitor treatment. Performed at [facility], [place] [address], [place], [place] [address]     Radiology Studies: Ct Hip Left Wo Contrast  Result Date: 01/29/2018 CLINICAL DATA:  Comminuted left hip fracture, assessment for underlying lesion. EXAM: CT OF THE LEFT HIP WITHOUT CONTRAST TECHNIQUE: Multidetector CT imaging of the left hip was performed according to the standard protocol. Multiplanar CT image reconstructions were also generated. COMPARISON:  01/29/2018 FINDINGS: Bones/Joint/Cartilage Comminuted intertrochanteric fracture with multiple greater trochanteric  fragments, a moderate-sized and mildly fragmented lesser trochanteric fragment likely containing a significant component of the calcar, and the dominant intertrochanteric fracture plane demonstrating varus angulation and some apex anterior angulation. There is hematoma along the fracture plane but I do not perceive a underlying lytic lesion or other lesion to suggest that this fracture is due to underlying pathologic malignancy. Ligaments Suboptimally assessed by CT. Muscles and Tendons Expected surrounding edema. There is gluteus minimus chronic atrophy. Soft tissues Expected hematoma around the fracture site. Other: Sigmoid colon diverticulosis. IMPRESSION: 1. No underlying lesion associated with the intertrochanteric fracture to suggest a pathologic fracture. 2. The intertrochanteric fracture has comminuted lesser and greater trochanteric components as well as varus angulation. Electronically Signed   By: Van Clines M.D.   On: 01/29/2018 21:33   Dg Chest Port 1 View  Result Date: 01/29/2018 CLINICAL DATA:  84 year old female preoperative study left femur fracture. History of right lung carcinoma status post radiation therapy. EXAM: PORTABLE CHEST 1 VIEW COMPARISON:  Chest radiographs 10/07/2016 and earlier. Chest CT 02/21/2017. FINDINGS: Portable AP semi upright view at 1952 hours. Small bilateral pleural effusions in 2017 have resolved. Stable cardiomegaly and mediastinal contours. Large lung volumes. Vague chronic increased right paratracheal and medial right upper lung opacity appears stable since 2017 and is correlated to post radiation changes in the right upper lung on the 2018 CT. No pneumothorax, pulmonary edema or acute pulmonary opacity. No acute osseous abnormality identified in the chest IMPRESSION: 1.  No acute cardiopulmonary abnormality. 2. Chronic cardiomegaly, pulmonary hyperinflation, and right upper lobe radiation fibrosis. Electronically Signed   By: Genevie Ann M.D.   On: 01/29/2018  20:29   Dg C-arm 1-60 Min-no Report  Result Date: 01/30/2018 Fluoroscopy was utilized by the requesting physician.  No radiographic interpretation.   Dg Hip Operative Unilat With Pelvis Left  Result Date: 01/30/2018 CLINICAL DATA:  84 year old female with left intertrochanteric fracture. Subsequent encounter. EXAM: OPERATIVE left HIP (WITH PELVIS IF PERFORMED) 3 VIEWS TECHNIQUE: Fluoroscopic spot image(s) were submitted for interpretation post-operatively. COMPARISON:  01/29/2018 plain film exam. FINDINGS: Left intertrochanteric fracture reduced utilizing left femoral intramedullary rod with proximal sliding type screw and distal fixation screw. Single AP projection without complication noted although tip of femoral component not excluded. IMPRESSION: Post open reduction and internal fixation of left intertrochanteric fracture. Electronically Signed   By: Genia Del M.D.   On: 01/30/2018 14:01   Dg Hip Unilat W Or Wo Pelvis 2-3 Views Left  Result Date: 01/29/2018 CLINICAL DATA:  Fall today with left hip pain. EXAM: DG HIP (WITH OR WITHOUT PELVIS) 2-3V LEFT COMPARISON:  None. FINDINGS: Intertrochanteric left hip fracture with mild comminution. There is enough underlying lucency that I recommend cross-sectional imaging such as  CT in order to exclude an underlying lytic lesion/pathologic fracture. Clustered gallstones in the right abdomen. IMPRESSION: 1. Moderately comminuted intratrochanteric fracture on the left with mild varus angulation. There is enough underlying lucency that the possibility of a lytic lesion and pathologic fracture is raised, and CT should be considered to further characterize. 2. Cholelithiasis. Electronically Signed   By: Van Clines M.D.   On: 01/29/2018 19:59   Scheduled Meds: . amLODipine  5 mg Oral Daily  . aspirin EC  325 mg Oral BID  . bisoprolol  5 mg Oral Daily  . celecoxib  200 mg Oral BID  . docusate sodium  100 mg Oral BID  . ferrous sulfate  325 mg Oral  TID PC  . methimazole  5 mg Oral Daily  . multivitamin with minerals  1 tablet Oral Daily  . senna  1 tablet Oral BID  . sodium chloride flush  3 mL Intravenous Q12H   Continuous Infusions: . sodium chloride    . sodium chloride 50 mL/hr at 01/31/18 1115  . lactated ringers 20 mL/hr at 01/30/18 1201    LOS: 2 days   Kerney Elbe, DO [facility] Pager [phone]  If 7PM-7AM, please contact night-coverage [email or web address] Password [facility] 01/31/2018, 6:07 PM

## 2018-02-01 DIAGNOSIS — J181 Lobar pneumonia, unspecified organism: Secondary | ICD-10-CM | POA: Diagnosis not present

## 2018-02-01 DIAGNOSIS — R52 Pain, unspecified: Secondary | ICD-10-CM

## 2018-02-01 DIAGNOSIS — D62 Acute posthemorrhagic anemia: Secondary | ICD-10-CM | POA: Diagnosis not present

## 2018-02-01 DIAGNOSIS — S72002A Fracture of unspecified part of neck of left femur, initial encounter for closed fracture: Secondary | ICD-10-CM | POA: Diagnosis not present

## 2018-02-01 DIAGNOSIS — Z85118 Personal history of other malignant neoplasm of bronchus and lung: Secondary | ICD-10-CM | POA: Diagnosis not present

## 2018-02-01 DIAGNOSIS — R05 Cough: Secondary | ICD-10-CM | POA: Diagnosis not present

## 2018-02-01 DIAGNOSIS — D72829 Elevated white blood cell count, unspecified: Secondary | ICD-10-CM | POA: Diagnosis not present

## 2018-02-01 DIAGNOSIS — I2510 Atherosclerotic heart disease of native coronary artery without angina pectoris: Secondary | ICD-10-CM | POA: Diagnosis not present

## 2018-02-01 DIAGNOSIS — J189 Pneumonia, unspecified organism: Secondary | ICD-10-CM | POA: Diagnosis not present

## 2018-02-01 DIAGNOSIS — G8911 Acute pain due to trauma: Secondary | ICD-10-CM | POA: Diagnosis not present

## 2018-02-01 DIAGNOSIS — R262 Difficulty in walking, not elsewhere classified: Secondary | ICD-10-CM | POA: Diagnosis not present

## 2018-02-01 DIAGNOSIS — I509 Heart failure, unspecified: Secondary | ICD-10-CM | POA: Diagnosis not present

## 2018-02-01 DIAGNOSIS — E569 Vitamin deficiency, unspecified: Secondary | ICD-10-CM | POA: Diagnosis not present

## 2018-02-01 DIAGNOSIS — J449 Chronic obstructive pulmonary disease, unspecified: Secondary | ICD-10-CM | POA: Diagnosis not present

## 2018-02-01 DIAGNOSIS — E0590 Thyrotoxicosis, unspecified without thyrotoxic crisis or storm: Secondary | ICD-10-CM | POA: Diagnosis not present

## 2018-02-01 DIAGNOSIS — J129 Viral pneumonia, unspecified: Secondary | ICD-10-CM | POA: Diagnosis not present

## 2018-02-01 DIAGNOSIS — J90 Pleural effusion, not elsewhere classified: Secondary | ICD-10-CM | POA: Diagnosis not present

## 2018-02-01 DIAGNOSIS — R0600 Dyspnea, unspecified: Secondary | ICD-10-CM | POA: Diagnosis not present

## 2018-02-01 DIAGNOSIS — I517 Cardiomegaly: Secondary | ICD-10-CM | POA: Diagnosis not present

## 2018-02-01 DIAGNOSIS — K5900 Constipation, unspecified: Secondary | ICD-10-CM | POA: Diagnosis not present

## 2018-02-01 DIAGNOSIS — J309 Allergic rhinitis, unspecified: Secondary | ICD-10-CM | POA: Diagnosis not present

## 2018-02-01 DIAGNOSIS — E871 Hypo-osmolality and hyponatremia: Secondary | ICD-10-CM

## 2018-02-01 DIAGNOSIS — D509 Iron deficiency anemia, unspecified: Secondary | ICD-10-CM | POA: Diagnosis not present

## 2018-02-01 DIAGNOSIS — S79911A Unspecified injury of right hip, initial encounter: Secondary | ICD-10-CM | POA: Diagnosis not present

## 2018-02-01 DIAGNOSIS — F329 Major depressive disorder, single episode, unspecified: Secondary | ICD-10-CM | POA: Diagnosis not present

## 2018-02-01 DIAGNOSIS — R600 Localized edema: Secondary | ICD-10-CM | POA: Diagnosis not present

## 2018-02-01 DIAGNOSIS — M25552 Pain in left hip: Secondary | ICD-10-CM | POA: Diagnosis not present

## 2018-02-01 DIAGNOSIS — D696 Thrombocytopenia, unspecified: Secondary | ICD-10-CM | POA: Diagnosis not present

## 2018-02-01 DIAGNOSIS — S7290XD Unspecified fracture of unspecified femur, subsequent encounter for closed fracture with routine healing: Secondary | ICD-10-CM | POA: Diagnosis not present

## 2018-02-01 DIAGNOSIS — R918 Other nonspecific abnormal finding of lung field: Secondary | ICD-10-CM | POA: Diagnosis not present

## 2018-02-01 DIAGNOSIS — Z4789 Encounter for other orthopedic aftercare: Secondary | ICD-10-CM | POA: Diagnosis not present

## 2018-02-01 DIAGNOSIS — S72012D Unspecified intracapsular fracture of left femur, subsequent encounter for closed fracture with routine healing: Secondary | ICD-10-CM | POA: Diagnosis not present

## 2018-02-01 DIAGNOSIS — R0902 Hypoxemia: Secondary | ICD-10-CM | POA: Diagnosis not present

## 2018-02-01 DIAGNOSIS — J9811 Atelectasis: Secondary | ICD-10-CM | POA: Diagnosis not present

## 2018-02-01 DIAGNOSIS — N183 Chronic kidney disease, stage 3 (moderate): Secondary | ICD-10-CM | POA: Diagnosis not present

## 2018-02-01 DIAGNOSIS — I10 Essential (primary) hypertension: Secondary | ICD-10-CM | POA: Diagnosis not present

## 2018-02-01 DIAGNOSIS — M818 Other osteoporosis without current pathological fracture: Secondary | ICD-10-CM | POA: Diagnosis not present

## 2018-02-01 DIAGNOSIS — Z8781 Personal history of (healed) traumatic fracture: Secondary | ICD-10-CM | POA: Diagnosis not present

## 2018-02-01 DIAGNOSIS — E039 Hypothyroidism, unspecified: Secondary | ICD-10-CM | POA: Diagnosis not present

## 2018-02-01 DIAGNOSIS — M6281 Muscle weakness (generalized): Secondary | ICD-10-CM | POA: Diagnosis not present

## 2018-02-01 DIAGNOSIS — I272 Pulmonary hypertension, unspecified: Secondary | ICD-10-CM | POA: Diagnosis not present

## 2018-02-01 LAB — CBC WITH DIFFERENTIAL/PLATELET
BASOS ABS: 0 10*3/uL (ref 0.0–0.1)
BASOS PCT: 0 %
Eosinophils Absolute: 0.2 10*3/uL (ref 0.0–0.7)
Eosinophils Relative: 2 %
HEMATOCRIT: 25.4 % — AB (ref 36.0–46.0)
Hemoglobin: 8.6 g/dL — ABNORMAL LOW (ref 12.0–15.0)
LYMPHS PCT: 22 %
Lymphs Abs: 2.6 10*3/uL (ref 0.7–4.0)
MCH: 31.2 pg (ref 26.0–34.0)
MCHC: 33.9 g/dL (ref 30.0–36.0)
MCV: 92 fL (ref 78.0–100.0)
Monocytes Absolute: 1.3 10*3/uL — ABNORMAL HIGH (ref 0.1–1.0)
Monocytes Relative: 11 %
NEUTROS ABS: 7.4 10*3/uL (ref 1.7–7.7)
Neutrophils Relative %: 65 %
PLATELETS: 140 10*3/uL — AB (ref 150–400)
RBC: 2.76 MIL/uL — AB (ref 3.87–5.11)
RDW: 14.4 % (ref 11.5–15.5)
WBC: 11.6 10*3/uL — AB (ref 4.0–10.5)

## 2018-02-01 LAB — COMPREHENSIVE METABOLIC PANEL
ALT: 9 U/L — ABNORMAL LOW (ref 14–54)
AST: 28 U/L (ref 15–41)
Albumin: 2.5 g/dL — ABNORMAL LOW (ref 3.5–5.0)
Alkaline Phosphatase: 36 U/L — ABNORMAL LOW (ref 38–126)
Anion gap: 6 (ref 5–15)
BILIRUBIN TOTAL: 0.4 mg/dL (ref 0.3–1.2)
BUN: 28 mg/dL — AB (ref 6–20)
CO2: 26 mmol/L (ref 22–32)
Calcium: 8.5 mg/dL — ABNORMAL LOW (ref 8.9–10.3)
Chloride: 105 mmol/L (ref 101–111)
Creatinine, Ser: 1.07 mg/dL — ABNORMAL HIGH (ref 0.44–1.00)
GFR, EST AFRICAN AMERICAN: 50 mL/min — AB (ref >60)
GFR, EST NON AFRICAN AMERICAN: 43 mL/min — AB (ref >60)
Glucose, Bld: 112 mg/dL — ABNORMAL HIGH (ref 65–99)
POTASSIUM: 4.2 mmol/L (ref 3.5–5.1)
Sodium: 137 mmol/L (ref 135–145)
TOTAL PROTEIN: 5 g/dL — AB (ref 6.5–8.1)

## 2018-02-01 LAB — PHOSPHORUS: PHOSPHORUS: 3 mg/dL (ref 2.5–4.6)

## 2018-02-01 LAB — MAGNESIUM: MAGNESIUM: 2.1 mg/dL (ref 1.7–2.4)

## 2018-02-01 MED ORDER — FUROSEMIDE 40 MG PO TABS: 40.0000 mg | ORAL_TABLET | Freq: Every day | ORAL | Status: DC

## 2018-02-01 MED ORDER — ACETAMINOPHEN 325 MG PO TABS: 325.0000 mg | ORAL_TABLET | Freq: Four times a day (QID) | ORAL | Status: DC | PRN

## 2018-02-01 MED ORDER — IPRATROPIUM-ALBUTEROL 0.5-2.5 (3) MG/3ML IN SOLN: 3.0000 mL | Freq: Four times a day (QID) | RESPIRATORY_TRACT | Status: DC | PRN

## 2018-02-01 NOTE — Care Management Important Message (Signed)
Important Message  Patient Details  Name: Michelle Levine MRN: [id number] Date of Birth: Mar 21, 1922   Medicare Important Message Given:  Yes    Kerin Salen 02/01/2018, 11:49 AMImportant Message  Patient Details  Name: Michelle Levine MRN: [id number] Date of Birth: 01/28/1922   Medicare Important Message Given:  Yes    Kerin Salen 02/01/2018, 11:49 AM

## 2018-02-01 NOTE — Discharge Summary (Signed)
Physician Discharge Summary  Michelle Levine XIP:[id number] DOB: 04-02-1922 DOA: 01/29/2018  PCP: Crist Infante, MD  Admit date: 01/29/2018 Discharge date: 02/01/2018  Admitted From: Home Disposition: SNF  Recommendations for Outpatient Follow-up:  1. Follow up with PCP in 1-2 weeks 2. Follow up with Orthopedic Surgery Dr. Alvan Dame in 2 weeks 3. Please obtain CMP/CBC, Mag, Phos in one week 4. Please follow up on the following pending results:  Home Health: No Equipment/Devices: None recommended by PT  Discharge Condition: Stable CODE STATUS: FULL CODE Diet recommendation: Heart Healthy Diet   Brief/Interim Summary: The patient is a 83 y.o.female,with past medical history significant forcopd, hypertension, hyperthyroidism,rightlung cancerstatus post radiation and being followed by Dr. Earlie Server who presented  after falling/tripping on the curb while going to her car. Patient is usually ambulatory and lives independently. X-ray of left hip showed intertrochanteric fracture and a questionable lytic lesion which was not confirmed by CT of the hip. She underwent ORIF and is doing well. PT evaluated and recommended SNF. She will be D/C'd to SNF today as she is medically stable and she will need to follow up with PCP and Orthopedics as an outpatient.   Discharge Diagnoses:  Active Problems:   Pulmonary hypertension (HCC)   CHF (congestive heart failure) (HCC)   COPD mixed type (HCC)   Essential hypertension   S/p left hip fracture   Thrombocytopenia (HCC)   Leukocytosis   Acute blood loss as cause of postoperative anemia   Hyponatremia  Comminuted Intertochanteric Left hip/Femur fracture s/p ORIF POD 2 -CT did not show any lytic lesions. -Orthopedics did surgical repair 01/30/18 -Pain Control Per Orthopedics with Norco and they Recommend WBAT on Left Leg -C/w DVT Prophylaxis with ASA 325 mg po BID per Ortho  -Dressing to remain in place until follow up in Clinic in 2 weeks with Dr.  Alvan Dame   History of lung cancer and pulmonary hypertension  -Status post radiation in the past being followed by Dr. Earlie Server.  Leukocytosis, improving  -Likely Reactive 2/2 to Surgery -WBC went from 9.1 -> 14.2 -> 11.6 -Continue to Monitor for S/Sx of Infection -Repeat CBC in AM   Thrombocytopenia -Likely reactive. Platelet Count went from 179 -> 140 -> 140 -Continue to Monitor for S/Sx of Bleeding -Repeat CBC at SNF  Acute Blood Loss Anemia -Expected Post-Operative Surgical Drop -Hb/Hct went from 12.5/38.3 -> 9.3/28.1 -> 8.6/25.4 -C/w Ferrous Sulfate 325 mg po TIDwm and Docusate 100 mg po BID for Constipation associated  -Continue to Monitor for S/Sx of Bleeding -Repeat CBC at SNF  Hyponatremia -Likely from Dehydration from NPO Status; Now improved and is 137 -Given Gentle IVF Rehydration with NS 50 mL/hr -Continue to Monitor and Repeat CMP at SNF  Hypertension  -Continue with Amlodipine 5 mg po Daily and Bisoprolol 5 mg po Daily   Hyperthyroidism -TSH was 1.451 -C/w Methimazole 5 mg po Daily   COPD -Clinically stable.  -C/w DuoNeb q6hprn Wheezing/SOB as an outpateitn   CAD -C/w ASA 325 mg po BID per Orthopedics   CHF -Currently not Decompensated -Continue to Monitor Volume Status Extremely closely -Patient was on IVF at 50 mL/hr -Home Lasix held again today and resume tomorrow -Patient is +3.006 Liters since Admission   CKD Stage 3 -Stable -BUN/Cr slightly elevated from 20/0.88 -> 28/1.07 -Hold Lasix again today and resume in AM -Repeat CMP at [facility]  Discharge Instructions  Discharge Instructions    (HEART FAILURE PATIENTS) Call MD:  Anytime you have any of the following  symptoms: 1) 3 pound weight gain in 24 hours or 5 pounds in 1 week 2) shortness of breath, with or without a dry hacking cough 3) swelling in the hands, feet or stomach 4) if you have to sleep on extra pillows at night in order to breathe.   Complete by:  As directed    Call MD  for:  difficulty breathing, headache or visual disturbances   Complete by:  As directed    Call MD for:  extreme fatigue   Complete by:  As directed    Call MD for:  hives   Complete by:  As directed    Call MD for:  persistant dizziness or light-headedness   Complete by:  As directed    Call MD for:  persistant nausea and vomiting   Complete by:  As directed    Call MD for:  redness, tenderness, or signs of infection (pain, swelling, redness, odor or green/yellow discharge around incision site)   Complete by:  As directed    Call MD for:  severe uncontrolled pain   Complete by:  As directed    Call MD for:  temperature >100.4   Complete by:  As directed    Diet - low sodium heart healthy   Complete by:  As directed    Discharge instructions   Complete by:  As directed    Follow up with PCP and Orthopedics within 2 weeks. Take all medication as prescribed and repeat blood work in 3 days. If symptoms change or worsen please return to the ED for evaluation.   Increase activity slowly   Complete by:  As directed      Allergies as of 02/01/2018   No Known Allergies     Medication List    TAKE these medications   acetaminophen 325 MG tablet Commonly known as:  TYLENOL Take 1-2 tablets (325-650 mg total) by mouth every 6 (six) hours as needed for mild pain (pain score 1-3 or temp > 100.5).   amLODipine 5 MG tablet Commonly known as:  NORVASC Take 5 mg by mouth daily.   aspirin 325 MG EC tablet Take 1 tablet (325 mg total) by mouth 2 (two) times daily. Take for 4 weeks, then resume regular dose. What changed:    medication strength  how much to take  when to take this  additional instructions   bisoprolol 5 MG tablet Commonly known as:  ZEBETA TAKE 1 TABLET BY MOUTH EVERY DAY   CALCIUM + D PO Take 1 capsule by mouth daily.   citalopram 20 MG tablet Commonly known as:  CELEXA Take 20 mg by mouth daily.   docusate sodium 100 MG capsule Commonly known as:   COLACE Take 1 capsule (100 mg total) by mouth 2 (two) times daily.   ferrous sulfate 325 (65 FE) MG tablet Take 1 tablet (325 mg total) by mouth 3 (three) times daily after meals.   fluticasone 50 MCG/ACT nasal spray Commonly known as:  FLONASE Place 1 spray into both nostrils daily as needed for allergies.   furosemide 40 MG tablet Commonly known as:  LASIX Take 1 tablet (40 mg total) by mouth daily. Start taking on:  02/02/2018   galantamine 8 MG tablet Commonly known as:  RAZADYNE TAKE 1 TABLET BY MOUTH EACH MORNING WITH FOOD   HYDROcodone-acetaminophen 5-325 MG tablet Commonly known as:  NORCO/VICODIN Take 1-2 tablets by mouth every 4 (four) hours as needed for moderate pain or severe pain.  ipratropium-albuterol 0.5-2.5 (3) MG/3ML Soln Commonly known as:  DUONEB Take 3 mLs by nebulization every 6 (six) hours as needed.   KLOR-CON 10 10 MEQ tablet Generic drug:  potassium chloride Take 10 mEq by mouth daily.   methimazole 10 MG tablet Commonly known as:  TAPAZOLE Take 5 mg by mouth daily.   multivitamin with minerals Tabs tablet Take 1 tablet by mouth daily.   RECLAST 5 MG/100ML Soln injection Generic drug:  zoledronic acid Inject 5 mg into the vein. Reported on 11/30/2015      Follow-up Information    Paralee Cancel, MD. Schedule an appointment as soon as possible for a visit in 2 weeks.   Specialty:  Orthopedic Surgery Contact information: [address] [place] [address] [phone]          No Known Allergies  Consultations:  Orthopedic Surgery  Procedures/Studies: Dg Ankle 2 Views Left  Result Date: 01/31/2018 CLINICAL DATA:  Left ankle pain without injury. Status post IM nail of left hip 1 day ago. EXAM: LEFT ANKLE - 2 VIEW COMPARISON:  None. FINDINGS: No fracture or dislocation is seen. The ankle mortise is intact. The visualized soft tissues are unremarkable. IMPRESSION: Negative. Electronically Signed   By: Julian Hy M.D.   On: 01/31/2018 20:36   Ct Hip Left Wo Contrast  Result Date: 01/29/2018 CLINICAL DATA:  Comminuted left hip fracture, assessment for underlying lesion. EXAM: CT OF THE LEFT HIP WITHOUT CONTRAST TECHNIQUE: Multidetector CT imaging of the left hip was performed according to the standard protocol. Multiplanar CT image reconstructions were also generated. COMPARISON:  01/29/2018 FINDINGS: Bones/Joint/Cartilage Comminuted intertrochanteric fracture with multiple greater trochanteric fragments, a moderate-sized and mildly fragmented lesser trochanteric fragment likely containing a significant component of the calcar, and the dominant intertrochanteric fracture plane demonstrating varus angulation and some apex anterior angulation. There is hematoma along the fracture plane but I do not perceive a underlying lytic lesion or other lesion to suggest that this fracture is due to underlying pathologic malignancy. Ligaments Suboptimally assessed by CT. Muscles and Tendons Expected surrounding edema. There is gluteus minimus chronic atrophy. Soft tissues Expected hematoma around the fracture site. Other: Sigmoid colon diverticulosis. IMPRESSION: 1. No underlying lesion associated with the intertrochanteric fracture to suggest a pathologic fracture. 2. The intertrochanteric fracture has comminuted lesser and greater trochanteric components as well as varus angulation. Electronically Signed   By: Van Clines M.D.   On: 01/29/2018 21:33   Dg Chest Port 1 View  Result Date: 01/29/2018 CLINICAL DATA:  84 year old female preoperative study left femur fracture. History of right lung carcinoma status post radiation therapy. EXAM: PORTABLE CHEST 1 VIEW COMPARISON:  Chest radiographs 10/07/2016 and earlier. Chest CT 02/21/2017. FINDINGS: Portable AP semi upright view at 1952 hours. Small bilateral pleural effusions in 2017 have resolved. Stable cardiomegaly and mediastinal contours. Large lung volumes. Vague  chronic increased right paratracheal and medial right upper lung opacity appears stable since 2017 and is correlated to post radiation changes in the right upper lung on the 2018 CT. No pneumothorax, pulmonary edema or acute pulmonary opacity. No acute osseous abnormality identified in the chest IMPRESSION: 1.  No acute cardiopulmonary abnormality. 2. Chronic cardiomegaly, pulmonary hyperinflation, and right upper lobe radiation fibrosis. Electronically Signed   By: Genevie Ann M.D.   On: 01/29/2018 20:29   Dg C-arm 1-60 Min-no Report  Result Date: 01/30/2018 Fluoroscopy was utilized by the requesting physician.  No radiographic interpretation.   Dg Hip Operative Unilat  With Pelvis Left  Result Date: 01/30/2018 CLINICAL DATA:  84 year old female with left intertrochanteric fracture. Subsequent encounter. EXAM: OPERATIVE left HIP (WITH PELVIS IF PERFORMED) 3 VIEWS TECHNIQUE: Fluoroscopic spot image(s) were submitted for interpretation post-operatively. COMPARISON:  01/29/2018 plain film exam. FINDINGS: Left intertrochanteric fracture reduced utilizing left femoral intramedullary rod with proximal sliding type screw and distal fixation screw. Single AP projection without complication noted although tip of femoral component not excluded. IMPRESSION: Post open reduction and internal fixation of left intertrochanteric fracture. Electronically Signed   By: Genia Del M.D.   On: 01/30/2018 14:01   Dg Hip Unilat W Or Wo Pelvis 2-3 Views Left  Result Date: 01/29/2018 CLINICAL DATA:  Fall today with left hip pain. EXAM: DG HIP (WITH OR WITHOUT PELVIS) 2-3V LEFT COMPARISON:  None. FINDINGS: Intertrochanteric left hip fracture with mild comminution. There is enough underlying lucency that I recommend cross-sectional imaging such as CT in order to exclude an underlying lytic lesion/pathologic fracture. Clustered gallstones in the right abdomen. IMPRESSION: 1. Moderately comminuted intratrochanteric fracture on the  left with mild varus angulation. There is enough underlying lucency that the possibility of a lytic lesion and pathologic fracture is raised, and CT should be considered to further characterize. 2. Cholelithiasis. Electronically Signed   By: Van Clines M.D.   On: 01/29/2018 19:59    Subjective: Seen and examined at bedside and was doing well. Had no Complaints. Last night had some ankle pain but it improved. No CP or SOB. Ready to go to SNF.  Discharge Exam: Vitals:   02/01/18 0606 02/01/18 0847  BP: 118/60 123/68  Pulse: 83 77  Resp: 18 (!) 22  Temp: 98 F (36.7 C) 98.7 F (37.1 C)  SpO2: 93% 95%   Vitals:   01/31/18 1359 01/31/18 2159 02/01/18 0606 02/01/18 0847  BP: 114/62 (!) 104/58 118/60 123/68  Pulse: 76 80 83 77  Resp: 20 18 18  (!) 22  Temp: 97.8 F (36.6 C) 98.7 F (37.1 C) 98 F (36.7 C) 98.7 F (37.1 C)  TempSrc: Oral Oral Oral Oral  SpO2: 95% 92% 93% 95%  Weight:      Height:       General: Pt is alert, awake, not in acute distress Cardiovascular: RRR, S1/S2 +, no rubs, no gallops Respiratory: Diminished bilaterally, no wheezing, no rhonchi Abdominal: Soft, NT, ND, bowel sounds + Extremities: no edema, no cyanosis; Incisions appear C/D/I  The results of significant diagnostics from this hospitalization (including imaging, microbiology, ancillary and laboratory) are listed below for reference.    Microbiology: Recent Results (from the past 240 hour(s))  Surgical pcr screen     Status: None   Collection Time: 01/30/18 11:10 AM  Result Value Ref Range Status   MRSA, PCR NEGATIVE NEGATIVE Final   Staphylococcus aureus NEGATIVE NEGATIVE Final    Comment: (NOTE) The Xpert SA Assay (FDA approved for NASAL specimens in patients 19 years of age and older), is one component of a comprehensive surveillance program. It is not intended to diagnose infection nor to guide or monitor treatment. Performed at [facility], [place] [address] [place],  [address]     Labs: BNP (last 3 results) No results for input(s): BNP in the last 8760 hours. Basic Metabolic Panel: Recent Labs  Lab 01/29/18 1956 01/31/18 0550 02/01/18 0533  NA 141 133* 137  K 3.7 3.7 4.2  CL 107 101 105  CO2 25 26 26   GLUCOSE 140* 140* 112*  BUN 17  20 28*  CREATININE 0.93 0.88 1.07*  CALCIUM 8.8* 8.6* 8.5*  MG  --   --  2.1  PHOS  --   --  3.0   Liver Function Tests: Recent Labs  Lab 02/01/18 0533  AST 28  ALT 9*  ALKPHOS 36*  BILITOT 0.4  PROT 5.0*  ALBUMIN 2.5*   No results for input(s): LIPASE, AMYLASE in the last 168 hours. No results for input(s): AMMONIA in the last 168 hours. CBC: Recent Labs  Lab 01/29/18 1956 01/31/18 0550 02/01/18 0533  WBC 9.1 14.2* 11.6*  NEUTROABS 6.8  --  7.4  HGB 12.5 9.3* 8.6*  HCT 38.3 28.1* 25.4*  MCV 91.4 92.7 92.0  PLT 179 140* 140*   Cardiac Enzymes: No results for input(s): CKTOTAL, CKMB, CKMBINDEX, TROPONINI in the last 168 hours. BNP: Invalid input(s): POCBNP CBG: Recent Labs  Lab 01/30/18 0723  GLUCAP 114*   D-Dimer No results for input(s): DDIMER in the last 72 hours. Hgb A1c No results for input(s): HGBA1C in the last 72 hours. Lipid Profile No results for input(s): CHOL, HDL, LDLCALC, TRIG, CHOLHDL, LDLDIRECT in the last 72 hours. Thyroid function studies Recent Labs    01/30/18 1641  TSH 1.451   Anemia work up No results for input(s): VITAMINB12, FOLATE, FERRITIN, TIBC, IRON, RETICCTPCT in the last 72 hours. Urinalysis No results found for: COLORURINE, APPEARANCEUR, [place], [place], [place], [place], [place], [place], PROTEINUR, UROBILINOGEN, NITRITE, LEUKOCYTESUR Sepsis Labs Invalid input(s): PROCALCITONIN,  WBC,  LACTICIDVEN Microbiology Recent Results (from the past 240 hour(s))  Surgical pcr screen     Status: None   Collection Time: 01/30/18 11:10 AM  Result Value Ref Range Status   MRSA, PCR NEGATIVE NEGATIVE Final   Staphylococcus aureus  NEGATIVE NEGATIVE Final    Comment: (NOTE) The Xpert SA Assay (FDA approved for NASAL specimens in patients 71 years of age and older), is one component of a comprehensive surveillance program. It is not intended to diagnose infection nor to guide or monitor treatment. Performed at [facility], [place] [address] [place], [place] [address]    Time coordinating discharge: 35 minutes  SIGNED:  Kerney Elbe, DO [facility] 02/01/2018, 1:32 PM Pager [phone]  If 7PM-7AM, please contact night-coverage [email or web address] Password [email or web address]

## 2018-02-01 NOTE — Progress Notes (Signed)
     Subjective: 2 Days Post-Op Procedure(s) (LRB): INTRAMEDULLARY (IM) NAIL FEMORAL LEFT (Left)   Patient reports pain as none, pain controlled. No events throughout the night. She feels that she is ready to be discharged.  Looking forward to being near her husband.  Orthopaedically stable.  Objective:   VITALS:   Vitals:   02/01/18 0606 02/01/18 0847  BP: 118/60 123/68  Pulse: 83 77  Resp: 18 (!) 22  Temp: 98 F (36.7 C) 98.7 F (37.1 C)  SpO2: 93% 95%    Dorsiflexion/Plantar flexion intact Incision: dressing C/D/I No cellulitis present Compartment soft  LABS Recent Labs    01/29/18 1956 01/31/18 0550 02/01/18 0533  HGB 12.5 9.3* 8.6*  HCT 38.3 28.1* 25.4*  WBC 9.1 14.2* 11.6*  PLT 179 140* 140*    Recent Labs    01/29/18 1956 01/31/18 0550 02/01/18 0533  NA 141 133* 137  K 3.7 3.7 4.2  BUN 17 20 28*  CREATININE 0.93 0.88 1.07*  GLUCOSE 140* 140* 112*     Assessment/Plan: 2 Days Post-Op Procedure(s) (LRB): INTRAMEDULLARY (IM) NAIL FEMORAL LEFT (Left) Up with therapy Discharge to SNF when ready medically Orthopaedically stable    Ortho recommendations:  ASA 325 mg bid for 4 weeks for anticoagulation, unless other medically indicated.  Norco for pain management (Rx written).  MiraLax and Colace for constipation  Iron 325 mg tid for 2-3 weeks   WBAT on the left leg.  Dressing to remain in place until follow in clinic in 2 weeks.  Dressing is waterproof and may shower with it in place.  Follow up in 2 weeks at [facility]. Follow up with OLIN,Nandita Mathenia D in 2 weeks.  Contact information:  [facility] [address] [place]   PAC  02/01/2018, 8:52 AM

## 2018-02-01 NOTE — Progress Notes (Signed)
Report called to [facility].

## 2018-02-05 DIAGNOSIS — E0590 Thyrotoxicosis, unspecified without thyrotoxic crisis or storm: Secondary | ICD-10-CM | POA: Diagnosis not present

## 2018-02-05 DIAGNOSIS — R52 Pain, unspecified: Secondary | ICD-10-CM | POA: Diagnosis not present

## 2018-02-05 DIAGNOSIS — I10 Essential (primary) hypertension: Secondary | ICD-10-CM | POA: Diagnosis not present

## 2018-02-05 DIAGNOSIS — Z85118 Personal history of other malignant neoplasm of bronchus and lung: Secondary | ICD-10-CM | POA: Diagnosis not present

## 2018-02-05 DIAGNOSIS — D696 Thrombocytopenia, unspecified: Secondary | ICD-10-CM | POA: Diagnosis not present

## 2018-02-05 DIAGNOSIS — E871 Hypo-osmolality and hyponatremia: Secondary | ICD-10-CM | POA: Diagnosis not present

## 2018-02-05 DIAGNOSIS — I2510 Atherosclerotic heart disease of native coronary artery without angina pectoris: Secondary | ICD-10-CM | POA: Diagnosis not present

## 2018-02-05 DIAGNOSIS — I509 Heart failure, unspecified: Secondary | ICD-10-CM | POA: Diagnosis not present

## 2018-02-05 DIAGNOSIS — M6281 Muscle weakness (generalized): Secondary | ICD-10-CM | POA: Diagnosis not present

## 2018-02-05 DIAGNOSIS — J449 Chronic obstructive pulmonary disease, unspecified: Secondary | ICD-10-CM | POA: Diagnosis not present

## 2018-02-05 DIAGNOSIS — S7290XD Unspecified fracture of unspecified femur, subsequent encounter for closed fracture with routine healing: Secondary | ICD-10-CM | POA: Diagnosis not present

## 2018-02-05 DIAGNOSIS — D72829 Elevated white blood cell count, unspecified: Secondary | ICD-10-CM | POA: Diagnosis not present

## 2018-02-06 DIAGNOSIS — Z4789 Encounter for other orthopedic aftercare: Secondary | ICD-10-CM | POA: Diagnosis not present

## 2018-02-06 DIAGNOSIS — M6281 Muscle weakness (generalized): Secondary | ICD-10-CM | POA: Diagnosis not present

## 2018-02-06 DIAGNOSIS — Z85118 Personal history of other malignant neoplasm of bronchus and lung: Secondary | ICD-10-CM | POA: Diagnosis not present

## 2018-02-06 DIAGNOSIS — I2510 Atherosclerotic heart disease of native coronary artery without angina pectoris: Secondary | ICD-10-CM | POA: Diagnosis not present

## 2018-02-06 DIAGNOSIS — N183 Chronic kidney disease, stage 3 (moderate): Secondary | ICD-10-CM | POA: Diagnosis not present

## 2018-02-06 DIAGNOSIS — J449 Chronic obstructive pulmonary disease, unspecified: Secondary | ICD-10-CM | POA: Diagnosis not present

## 2018-02-06 DIAGNOSIS — R52 Pain, unspecified: Secondary | ICD-10-CM | POA: Diagnosis not present

## 2018-02-06 DIAGNOSIS — I10 Essential (primary) hypertension: Secondary | ICD-10-CM | POA: Diagnosis not present

## 2018-02-06 DIAGNOSIS — D509 Iron deficiency anemia, unspecified: Secondary | ICD-10-CM | POA: Diagnosis not present

## 2018-02-06 DIAGNOSIS — E0590 Thyrotoxicosis, unspecified without thyrotoxic crisis or storm: Secondary | ICD-10-CM | POA: Diagnosis not present

## 2018-02-06 DIAGNOSIS — S7290XD Unspecified fracture of unspecified femur, subsequent encounter for closed fracture with routine healing: Secondary | ICD-10-CM | POA: Diagnosis not present

## 2018-02-06 DIAGNOSIS — I509 Heart failure, unspecified: Secondary | ICD-10-CM | POA: Diagnosis not present

## 2018-02-08 DIAGNOSIS — D696 Thrombocytopenia, unspecified: Secondary | ICD-10-CM | POA: Diagnosis not present

## 2018-02-08 DIAGNOSIS — N183 Chronic kidney disease, stage 3 (moderate): Secondary | ICD-10-CM | POA: Diagnosis not present

## 2018-02-08 DIAGNOSIS — D72829 Elevated white blood cell count, unspecified: Secondary | ICD-10-CM | POA: Diagnosis not present

## 2018-02-08 DIAGNOSIS — E871 Hypo-osmolality and hyponatremia: Secondary | ICD-10-CM | POA: Diagnosis not present

## 2018-02-08 DIAGNOSIS — J449 Chronic obstructive pulmonary disease, unspecified: Secondary | ICD-10-CM | POA: Diagnosis not present

## 2018-02-08 DIAGNOSIS — S7290XD Unspecified fracture of unspecified femur, subsequent encounter for closed fracture with routine healing: Secondary | ICD-10-CM | POA: Diagnosis not present

## 2018-02-08 DIAGNOSIS — Z4789 Encounter for other orthopedic aftercare: Secondary | ICD-10-CM | POA: Diagnosis not present

## 2018-02-08 DIAGNOSIS — M6281 Muscle weakness (generalized): Secondary | ICD-10-CM | POA: Diagnosis not present

## 2018-02-08 DIAGNOSIS — I10 Essential (primary) hypertension: Secondary | ICD-10-CM | POA: Diagnosis not present

## 2018-02-08 DIAGNOSIS — Z85118 Personal history of other malignant neoplasm of bronchus and lung: Secondary | ICD-10-CM | POA: Diagnosis not present

## 2018-02-08 DIAGNOSIS — R52 Pain, unspecified: Secondary | ICD-10-CM | POA: Diagnosis not present

## 2018-02-08 DIAGNOSIS — D509 Iron deficiency anemia, unspecified: Secondary | ICD-10-CM | POA: Diagnosis not present

## 2018-02-12 DIAGNOSIS — Z4789 Encounter for other orthopedic aftercare: Secondary | ICD-10-CM | POA: Diagnosis not present

## 2018-02-12 DIAGNOSIS — J449 Chronic obstructive pulmonary disease, unspecified: Secondary | ICD-10-CM | POA: Diagnosis not present

## 2018-02-12 DIAGNOSIS — D72829 Elevated white blood cell count, unspecified: Secondary | ICD-10-CM | POA: Diagnosis not present

## 2018-02-12 DIAGNOSIS — D509 Iron deficiency anemia, unspecified: Secondary | ICD-10-CM | POA: Diagnosis not present

## 2018-02-12 DIAGNOSIS — M6281 Muscle weakness (generalized): Secondary | ICD-10-CM | POA: Diagnosis not present

## 2018-02-12 DIAGNOSIS — Z85118 Personal history of other malignant neoplasm of bronchus and lung: Secondary | ICD-10-CM | POA: Diagnosis not present

## 2018-02-12 DIAGNOSIS — S7290XD Unspecified fracture of unspecified femur, subsequent encounter for closed fracture with routine healing: Secondary | ICD-10-CM | POA: Diagnosis not present

## 2018-02-12 DIAGNOSIS — N183 Chronic kidney disease, stage 3 (moderate): Secondary | ICD-10-CM | POA: Diagnosis not present

## 2018-02-12 DIAGNOSIS — D696 Thrombocytopenia, unspecified: Secondary | ICD-10-CM | POA: Diagnosis not present

## 2018-02-12 DIAGNOSIS — I10 Essential (primary) hypertension: Secondary | ICD-10-CM | POA: Diagnosis not present

## 2018-02-12 DIAGNOSIS — R52 Pain, unspecified: Secondary | ICD-10-CM | POA: Diagnosis not present

## 2018-02-12 DIAGNOSIS — E871 Hypo-osmolality and hyponatremia: Secondary | ICD-10-CM | POA: Diagnosis not present

## 2018-02-14 ENCOUNTER — Telehealth: Payer: Self-pay | Admitting: Medical Oncology

## 2018-02-14 ENCOUNTER — Telehealth: Payer: Self-pay | Admitting: Internal Medicine

## 2018-02-14 DIAGNOSIS — Z4789 Encounter for other orthopedic aftercare: Secondary | ICD-10-CM | POA: Diagnosis not present

## 2018-02-14 NOTE — Telephone Encounter (Signed)
Scheduled appt per 4/10 sch message -pt Son is aware of appts date and time.

## 2018-02-14 NOTE — Telephone Encounter (Signed)
Told son I will requests appts for end of May. Schedule request sent.

## 2018-02-14 NOTE — Telephone Encounter (Signed)
Son called. Pt broke hip and is in [facility] getting rehab until may. Requested we cancel her CT , lab and OV. Done

## 2018-02-19 ENCOUNTER — Other Ambulatory Visit: Payer: Medicare Other

## 2018-02-19 DIAGNOSIS — R0600 Dyspnea, unspecified: Secondary | ICD-10-CM | POA: Diagnosis not present

## 2018-02-19 DIAGNOSIS — N183 Chronic kidney disease, stage 3 (moderate): Secondary | ICD-10-CM | POA: Diagnosis not present

## 2018-02-19 DIAGNOSIS — I10 Essential (primary) hypertension: Secondary | ICD-10-CM | POA: Diagnosis not present

## 2018-02-19 DIAGNOSIS — E871 Hypo-osmolality and hyponatremia: Secondary | ICD-10-CM | POA: Diagnosis not present

## 2018-02-19 DIAGNOSIS — Z4789 Encounter for other orthopedic aftercare: Secondary | ICD-10-CM | POA: Diagnosis not present

## 2018-02-19 DIAGNOSIS — R52 Pain, unspecified: Secondary | ICD-10-CM | POA: Diagnosis not present

## 2018-02-19 DIAGNOSIS — J449 Chronic obstructive pulmonary disease, unspecified: Secondary | ICD-10-CM | POA: Diagnosis not present

## 2018-02-19 DIAGNOSIS — D509 Iron deficiency anemia, unspecified: Secondary | ICD-10-CM | POA: Diagnosis not present

## 2018-02-19 DIAGNOSIS — S7290XD Unspecified fracture of unspecified femur, subsequent encounter for closed fracture with routine healing: Secondary | ICD-10-CM | POA: Diagnosis not present

## 2018-02-19 DIAGNOSIS — I509 Heart failure, unspecified: Secondary | ICD-10-CM | POA: Diagnosis not present

## 2018-02-19 DIAGNOSIS — R05 Cough: Secondary | ICD-10-CM | POA: Diagnosis not present

## 2018-02-19 DIAGNOSIS — M6281 Muscle weakness (generalized): Secondary | ICD-10-CM | POA: Diagnosis not present

## 2018-02-21 ENCOUNTER — Ambulatory Visit: Payer: Medicare Other | Admitting: Internal Medicine

## 2018-02-22 DIAGNOSIS — I509 Heart failure, unspecified: Secondary | ICD-10-CM | POA: Diagnosis not present

## 2018-02-22 DIAGNOSIS — J449 Chronic obstructive pulmonary disease, unspecified: Secondary | ICD-10-CM | POA: Diagnosis not present

## 2018-02-22 DIAGNOSIS — R0600 Dyspnea, unspecified: Secondary | ICD-10-CM | POA: Diagnosis not present

## 2018-02-26 ENCOUNTER — Ambulatory Visit: Payer: Medicare Other | Admitting: Cardiology

## 2018-03-02 DIAGNOSIS — R600 Localized edema: Secondary | ICD-10-CM | POA: Diagnosis not present

## 2018-03-02 DIAGNOSIS — R05 Cough: Secondary | ICD-10-CM | POA: Diagnosis not present

## 2018-03-02 DIAGNOSIS — R0902 Hypoxemia: Secondary | ICD-10-CM | POA: Diagnosis not present

## 2018-03-02 DIAGNOSIS — M6281 Muscle weakness (generalized): Secondary | ICD-10-CM | POA: Diagnosis not present

## 2018-03-05 DIAGNOSIS — J181 Lobar pneumonia, unspecified organism: Secondary | ICD-10-CM | POA: Diagnosis not present

## 2018-03-07 ENCOUNTER — Other Ambulatory Visit: Payer: Self-pay | Admitting: Internal Medicine

## 2018-03-07 ENCOUNTER — Ambulatory Visit
Admission: RE | Admit: 2018-03-07 | Discharge: 2018-03-07 | Disposition: A | Discharge status: Home / Self Care | Payer: Medicare Other | Source: Ambulatory Visit | Attending: Internal Medicine | Admitting: Internal Medicine

## 2018-03-07 DIAGNOSIS — I509 Heart failure, unspecified: Secondary | ICD-10-CM | POA: Diagnosis not present

## 2018-03-07 DIAGNOSIS — N183 Chronic kidney disease, stage 3 (moderate): Secondary | ICD-10-CM | POA: Diagnosis not present

## 2018-03-07 DIAGNOSIS — D509 Iron deficiency anemia, unspecified: Secondary | ICD-10-CM | POA: Diagnosis not present

## 2018-03-07 DIAGNOSIS — J90 Pleural effusion, not elsewhere classified: Secondary | ICD-10-CM | POA: Diagnosis not present

## 2018-03-07 DIAGNOSIS — J9811 Atelectasis: Secondary | ICD-10-CM | POA: Diagnosis not present

## 2018-03-07 DIAGNOSIS — E871 Hypo-osmolality and hyponatremia: Secondary | ICD-10-CM | POA: Diagnosis not present

## 2018-03-07 DIAGNOSIS — Z4789 Encounter for other orthopedic aftercare: Secondary | ICD-10-CM | POA: Diagnosis not present

## 2018-03-07 DIAGNOSIS — S7290XD Unspecified fracture of unspecified femur, subsequent encounter for closed fracture with routine healing: Secondary | ICD-10-CM | POA: Diagnosis not present

## 2018-03-07 DIAGNOSIS — R0902 Hypoxemia: Secondary | ICD-10-CM

## 2018-03-07 DIAGNOSIS — J449 Chronic obstructive pulmonary disease, unspecified: Secondary | ICD-10-CM | POA: Diagnosis not present

## 2018-03-07 DIAGNOSIS — R05 Cough: Secondary | ICD-10-CM | POA: Diagnosis not present

## 2018-03-07 DIAGNOSIS — M6281 Muscle weakness (generalized): Secondary | ICD-10-CM | POA: Diagnosis not present

## 2018-03-07 DIAGNOSIS — I10 Essential (primary) hypertension: Secondary | ICD-10-CM | POA: Diagnosis not present

## 2018-03-07 DIAGNOSIS — R0600 Dyspnea, unspecified: Secondary | ICD-10-CM | POA: Diagnosis not present

## 2018-03-07 MED ORDER — IOPAMIDOL (ISOVUE-370) INJECTION 76%
75.0000 mL | Freq: Once | INTRAVENOUS | Status: AC | PRN
  Administered 2018-03-07: 75 mL via INTRAVENOUS

## 2018-03-08 DIAGNOSIS — S72012D Unspecified intracapsular fracture of left femur, subsequent encounter for closed fracture with routine healing: Secondary | ICD-10-CM | POA: Diagnosis not present

## 2018-03-08 DIAGNOSIS — M25552 Pain in left hip: Secondary | ICD-10-CM | POA: Diagnosis not present

## 2018-03-19 DIAGNOSIS — S7290XD Unspecified fracture of unspecified femur, subsequent encounter for closed fracture with routine healing: Secondary | ICD-10-CM | POA: Diagnosis not present

## 2018-03-19 DIAGNOSIS — R52 Pain, unspecified: Secondary | ICD-10-CM | POA: Diagnosis not present

## 2018-03-19 DIAGNOSIS — I10 Essential (primary) hypertension: Secondary | ICD-10-CM | POA: Diagnosis not present

## 2018-03-19 DIAGNOSIS — M818 Other osteoporosis without current pathological fracture: Secondary | ICD-10-CM | POA: Diagnosis not present

## 2018-03-19 DIAGNOSIS — D509 Iron deficiency anemia, unspecified: Secondary | ICD-10-CM | POA: Diagnosis not present

## 2018-03-19 DIAGNOSIS — J309 Allergic rhinitis, unspecified: Secondary | ICD-10-CM | POA: Diagnosis not present

## 2018-03-19 DIAGNOSIS — J449 Chronic obstructive pulmonary disease, unspecified: Secondary | ICD-10-CM | POA: Diagnosis not present

## 2018-03-19 DIAGNOSIS — I509 Heart failure, unspecified: Secondary | ICD-10-CM | POA: Diagnosis not present

## 2018-03-19 DIAGNOSIS — M6281 Muscle weakness (generalized): Secondary | ICD-10-CM | POA: Diagnosis not present

## 2018-03-19 DIAGNOSIS — Z85118 Personal history of other malignant neoplasm of bronchus and lung: Secondary | ICD-10-CM | POA: Diagnosis not present

## 2018-03-19 DIAGNOSIS — N183 Chronic kidney disease, stage 3 (moderate): Secondary | ICD-10-CM | POA: Diagnosis not present

## 2018-03-19 DIAGNOSIS — I2510 Atherosclerotic heart disease of native coronary artery without angina pectoris: Secondary | ICD-10-CM | POA: Diagnosis not present

## 2018-03-20 DIAGNOSIS — M6281 Muscle weakness (generalized): Secondary | ICD-10-CM | POA: Diagnosis not present

## 2018-03-20 DIAGNOSIS — R0902 Hypoxemia: Secondary | ICD-10-CM | POA: Diagnosis not present

## 2018-03-20 DIAGNOSIS — I10 Essential (primary) hypertension: Secondary | ICD-10-CM | POA: Diagnosis not present

## 2018-03-20 DIAGNOSIS — N183 Chronic kidney disease, stage 3 (moderate): Secondary | ICD-10-CM | POA: Diagnosis not present

## 2018-03-20 DIAGNOSIS — J189 Pneumonia, unspecified organism: Secondary | ICD-10-CM | POA: Diagnosis not present

## 2018-03-20 DIAGNOSIS — S7290XD Unspecified fracture of unspecified femur, subsequent encounter for closed fracture with routine healing: Secondary | ICD-10-CM | POA: Diagnosis not present

## 2018-03-20 DIAGNOSIS — J449 Chronic obstructive pulmonary disease, unspecified: Secondary | ICD-10-CM | POA: Diagnosis not present

## 2018-03-20 DIAGNOSIS — Z4789 Encounter for other orthopedic aftercare: Secondary | ICD-10-CM | POA: Diagnosis not present

## 2018-03-20 DIAGNOSIS — D509 Iron deficiency anemia, unspecified: Secondary | ICD-10-CM | POA: Diagnosis not present

## 2018-03-20 DIAGNOSIS — I509 Heart failure, unspecified: Secondary | ICD-10-CM | POA: Diagnosis not present

## 2018-03-20 DIAGNOSIS — R52 Pain, unspecified: Secondary | ICD-10-CM | POA: Diagnosis not present

## 2018-03-20 DIAGNOSIS — Z85118 Personal history of other malignant neoplasm of bronchus and lung: Secondary | ICD-10-CM | POA: Diagnosis not present

## 2018-03-22 DIAGNOSIS — G894 Chronic pain syndrome: Secondary | ICD-10-CM | POA: Diagnosis not present

## 2018-03-22 DIAGNOSIS — N183 Chronic kidney disease, stage 3 (moderate): Secondary | ICD-10-CM | POA: Diagnosis not present

## 2018-03-22 DIAGNOSIS — E0590 Thyrotoxicosis, unspecified without thyrotoxic crisis or storm: Secondary | ICD-10-CM | POA: Diagnosis not present

## 2018-03-22 DIAGNOSIS — F0390 Unspecified dementia without behavioral disturbance: Secondary | ICD-10-CM | POA: Diagnosis not present

## 2018-03-22 DIAGNOSIS — M80052D Age-related osteoporosis with current pathological fracture, left femur, subsequent encounter for fracture with routine healing: Secondary | ICD-10-CM | POA: Diagnosis not present

## 2018-03-22 DIAGNOSIS — I509 Heart failure, unspecified: Secondary | ICD-10-CM | POA: Diagnosis not present

## 2018-03-22 DIAGNOSIS — I2510 Atherosclerotic heart disease of native coronary artery without angina pectoris: Secondary | ICD-10-CM | POA: Diagnosis not present

## 2018-03-22 DIAGNOSIS — J449 Chronic obstructive pulmonary disease, unspecified: Secondary | ICD-10-CM | POA: Diagnosis not present

## 2018-03-22 DIAGNOSIS — F329 Major depressive disorder, single episode, unspecified: Secondary | ICD-10-CM | POA: Diagnosis not present

## 2018-03-22 DIAGNOSIS — I130 Hypertensive heart and chronic kidney disease with heart failure and stage 1 through stage 4 chronic kidney disease, or unspecified chronic kidney disease: Secondary | ICD-10-CM | POA: Diagnosis not present

## 2018-03-27 DIAGNOSIS — G894 Chronic pain syndrome: Secondary | ICD-10-CM | POA: Diagnosis not present

## 2018-03-27 DIAGNOSIS — M80052D Age-related osteoporosis with current pathological fracture, left femur, subsequent encounter for fracture with routine healing: Secondary | ICD-10-CM | POA: Diagnosis not present

## 2018-03-27 DIAGNOSIS — I10 Essential (primary) hypertension: Secondary | ICD-10-CM | POA: Diagnosis not present

## 2018-03-27 DIAGNOSIS — D62 Acute posthemorrhagic anemia: Secondary | ICD-10-CM | POA: Diagnosis not present

## 2018-03-27 DIAGNOSIS — J449 Chronic obstructive pulmonary disease, unspecified: Secondary | ICD-10-CM | POA: Diagnosis not present

## 2018-03-27 DIAGNOSIS — Z0289 Encounter for other administrative examinations: Secondary | ICD-10-CM | POA: Diagnosis not present

## 2018-03-27 DIAGNOSIS — I509 Heart failure, unspecified: Secondary | ICD-10-CM | POA: Diagnosis not present

## 2018-03-27 DIAGNOSIS — R197 Diarrhea, unspecified: Secondary | ICD-10-CM | POA: Diagnosis not present

## 2018-03-27 DIAGNOSIS — I2510 Atherosclerotic heart disease of native coronary artery without angina pectoris: Secondary | ICD-10-CM | POA: Diagnosis not present

## 2018-03-27 DIAGNOSIS — Z6822 Body mass index (BMI) 22.0-22.9, adult: Secondary | ICD-10-CM | POA: Diagnosis not present

## 2018-03-27 DIAGNOSIS — S72142A Displaced intertrochanteric fracture of left femur, initial encounter for closed fracture: Secondary | ICD-10-CM | POA: Diagnosis not present

## 2018-03-27 DIAGNOSIS — I130 Hypertensive heart and chronic kidney disease with heart failure and stage 1 through stage 4 chronic kidney disease, or unspecified chronic kidney disease: Secondary | ICD-10-CM | POA: Diagnosis not present

## 2018-03-28 DIAGNOSIS — G894 Chronic pain syndrome: Secondary | ICD-10-CM | POA: Diagnosis not present

## 2018-03-28 DIAGNOSIS — Z4789 Encounter for other orthopedic aftercare: Secondary | ICD-10-CM | POA: Diagnosis not present

## 2018-03-28 DIAGNOSIS — M80052D Age-related osteoporosis with current pathological fracture, left femur, subsequent encounter for fracture with routine healing: Secondary | ICD-10-CM | POA: Diagnosis not present

## 2018-03-28 DIAGNOSIS — J449 Chronic obstructive pulmonary disease, unspecified: Secondary | ICD-10-CM | POA: Diagnosis not present

## 2018-03-28 DIAGNOSIS — I2510 Atherosclerotic heart disease of native coronary artery without angina pectoris: Secondary | ICD-10-CM | POA: Diagnosis not present

## 2018-03-28 DIAGNOSIS — I130 Hypertensive heart and chronic kidney disease with heart failure and stage 1 through stage 4 chronic kidney disease, or unspecified chronic kidney disease: Secondary | ICD-10-CM | POA: Diagnosis not present

## 2018-03-28 DIAGNOSIS — I509 Heart failure, unspecified: Secondary | ICD-10-CM | POA: Diagnosis not present

## 2018-03-29 DIAGNOSIS — G894 Chronic pain syndrome: Secondary | ICD-10-CM | POA: Diagnosis not present

## 2018-03-29 DIAGNOSIS — I130 Hypertensive heart and chronic kidney disease with heart failure and stage 1 through stage 4 chronic kidney disease, or unspecified chronic kidney disease: Secondary | ICD-10-CM | POA: Diagnosis not present

## 2018-03-29 DIAGNOSIS — J449 Chronic obstructive pulmonary disease, unspecified: Secondary | ICD-10-CM | POA: Diagnosis not present

## 2018-03-29 DIAGNOSIS — I2510 Atherosclerotic heart disease of native coronary artery without angina pectoris: Secondary | ICD-10-CM | POA: Diagnosis not present

## 2018-03-29 DIAGNOSIS — M80052D Age-related osteoporosis with current pathological fracture, left femur, subsequent encounter for fracture with routine healing: Secondary | ICD-10-CM | POA: Diagnosis not present

## 2018-03-29 DIAGNOSIS — I509 Heart failure, unspecified: Secondary | ICD-10-CM | POA: Diagnosis not present

## 2018-04-02 DIAGNOSIS — G894 Chronic pain syndrome: Secondary | ICD-10-CM | POA: Diagnosis not present

## 2018-04-02 DIAGNOSIS — M80052D Age-related osteoporosis with current pathological fracture, left femur, subsequent encounter for fracture with routine healing: Secondary | ICD-10-CM | POA: Diagnosis not present

## 2018-04-02 DIAGNOSIS — J449 Chronic obstructive pulmonary disease, unspecified: Secondary | ICD-10-CM | POA: Diagnosis not present

## 2018-04-02 DIAGNOSIS — I509 Heart failure, unspecified: Secondary | ICD-10-CM | POA: Diagnosis not present

## 2018-04-02 DIAGNOSIS — I130 Hypertensive heart and chronic kidney disease with heart failure and stage 1 through stage 4 chronic kidney disease, or unspecified chronic kidney disease: Secondary | ICD-10-CM | POA: Diagnosis not present

## 2018-04-02 DIAGNOSIS — I2510 Atherosclerotic heart disease of native coronary artery without angina pectoris: Secondary | ICD-10-CM | POA: Diagnosis not present

## 2018-04-03 ENCOUNTER — Inpatient Hospital Stay: Payer: Medicare Other | Attending: Internal Medicine

## 2018-04-03 DIAGNOSIS — I2510 Atherosclerotic heart disease of native coronary artery without angina pectoris: Secondary | ICD-10-CM | POA: Diagnosis not present

## 2018-04-03 DIAGNOSIS — Z923 Personal history of irradiation: Secondary | ICD-10-CM | POA: Insufficient documentation

## 2018-04-03 DIAGNOSIS — D62 Acute posthemorrhagic anemia: Secondary | ICD-10-CM | POA: Diagnosis not present

## 2018-04-03 DIAGNOSIS — I272 Pulmonary hypertension, unspecified: Secondary | ICD-10-CM | POA: Diagnosis not present

## 2018-04-03 DIAGNOSIS — J449 Chronic obstructive pulmonary disease, unspecified: Secondary | ICD-10-CM | POA: Insufficient documentation

## 2018-04-03 DIAGNOSIS — C3411 Malignant neoplasm of upper lobe, right bronchus or lung: Secondary | ICD-10-CM | POA: Insufficient documentation

## 2018-04-03 DIAGNOSIS — I509 Heart failure, unspecified: Secondary | ICD-10-CM | POA: Insufficient documentation

## 2018-04-03 DIAGNOSIS — I10 Essential (primary) hypertension: Secondary | ICD-10-CM | POA: Diagnosis not present

## 2018-04-03 DIAGNOSIS — C3491 Malignant neoplasm of unspecified part of right bronchus or lung: Secondary | ICD-10-CM

## 2018-04-03 DIAGNOSIS — Z79899 Other long term (current) drug therapy: Secondary | ICD-10-CM | POA: Diagnosis not present

## 2018-04-03 DIAGNOSIS — Z7982 Long term (current) use of aspirin: Secondary | ICD-10-CM | POA: Insufficient documentation

## 2018-04-03 DIAGNOSIS — Z6821 Body mass index (BMI) 21.0-21.9, adult: Secondary | ICD-10-CM | POA: Diagnosis not present

## 2018-04-03 DIAGNOSIS — I110 Hypertensive heart disease with heart failure: Secondary | ICD-10-CM | POA: Diagnosis not present

## 2018-04-03 LAB — CBC WITH DIFFERENTIAL/PLATELET
BASOS PCT: 1 %
Basophils Absolute: 0.1 10*3/uL (ref 0.0–0.1)
Eosinophils Absolute: 0.2 10*3/uL (ref 0.0–0.5)
Eosinophils Relative: 2 %
HEMATOCRIT: 36.5 % (ref 34.8–46.6)
HEMOGLOBIN: 11.6 g/dL (ref 11.6–15.9)
LYMPHS ABS: 1.5 10*3/uL (ref 0.9–3.3)
Lymphocytes Relative: 24 %
MCH: 29.6 pg (ref 25.1–34.0)
MCHC: 31.7 g/dL (ref 31.5–36.0)
MCV: 93.4 fL (ref 79.5–101.0)
MONO ABS: 0.6 10*3/uL (ref 0.1–0.9)
MONOS PCT: 10 %
NEUTROS ABS: 3.9 10*3/uL (ref 1.5–6.5)
NEUTROS PCT: 63 %
Platelets: 196 10*3/uL (ref 145–400)
RBC: 3.9 MIL/uL (ref 3.70–5.45)
RDW: 17.6 % — AB (ref 11.2–14.5)
WBC: 6.3 10*3/uL (ref 3.9–10.3)

## 2018-04-03 LAB — COMPREHENSIVE METABOLIC PANEL
ALK PHOS: 79 U/L (ref 40–150)
ALT: 19 U/L (ref 0–55)
ANION GAP: 7 (ref 3–11)
AST: 24 U/L (ref 5–34)
Albumin: 3.4 g/dL — ABNORMAL LOW (ref 3.5–5.0)
BILIRUBIN TOTAL: 0.6 mg/dL (ref 0.2–1.2)
BUN: 15 mg/dL (ref 7–26)
CALCIUM: 9.5 mg/dL (ref 8.4–10.4)
CO2: 30 mmol/L — ABNORMAL HIGH (ref 22–29)
Chloride: 105 mmol/L (ref 98–109)
Creatinine, Ser: 0.94 mg/dL (ref 0.60–1.10)
GFR calc Af Amer: 59 mL/min — ABNORMAL LOW (ref >60)
GFR, EST NON AFRICAN AMERICAN: 51 mL/min — AB (ref >60)
GLUCOSE: 148 mg/dL — AB (ref 70–140)
POTASSIUM: 3.9 mmol/L (ref 3.5–5.1)
Sodium: 142 mmol/L (ref 136–145)
TOTAL PROTEIN: 6.4 g/dL (ref 6.4–8.3)

## 2018-04-05 ENCOUNTER — Other Ambulatory Visit: Payer: Self-pay

## 2018-04-05 ENCOUNTER — Encounter: Payer: Self-pay | Admitting: Oncology

## 2018-04-05 ENCOUNTER — Telehealth: Payer: Self-pay | Admitting: Internal Medicine

## 2018-04-05 ENCOUNTER — Inpatient Hospital Stay (HOSPITAL_BASED_OUTPATIENT_CLINIC_OR_DEPARTMENT_OTHER): Payer: Medicare Other | Admitting: Oncology

## 2018-04-05 VITALS — BP 131/80 | HR 72 | Temp 98.5°F | Resp 17 | Ht 65.0 in | Wt 124.6 lb

## 2018-04-05 DIAGNOSIS — N189 Chronic kidney disease, unspecified: Secondary | ICD-10-CM | POA: Diagnosis not present

## 2018-04-05 DIAGNOSIS — I110 Hypertensive heart disease with heart failure: Secondary | ICD-10-CM | POA: Diagnosis not present

## 2018-04-05 DIAGNOSIS — Z923 Personal history of irradiation: Secondary | ICD-10-CM

## 2018-04-05 DIAGNOSIS — E0590 Thyrotoxicosis, unspecified without thyrotoxic crisis or storm: Secondary | ICD-10-CM | POA: Diagnosis not present

## 2018-04-05 DIAGNOSIS — J449 Chronic obstructive pulmonary disease, unspecified: Secondary | ICD-10-CM

## 2018-04-05 DIAGNOSIS — I2510 Atherosclerotic heart disease of native coronary artery without angina pectoris: Secondary | ICD-10-CM | POA: Diagnosis not present

## 2018-04-05 DIAGNOSIS — K5900 Constipation, unspecified: Secondary | ICD-10-CM | POA: Diagnosis not present

## 2018-04-05 DIAGNOSIS — R062 Wheezing: Secondary | ICD-10-CM | POA: Diagnosis not present

## 2018-04-05 DIAGNOSIS — Z7982 Long term (current) use of aspirin: Secondary | ICD-10-CM | POA: Diagnosis not present

## 2018-04-05 DIAGNOSIS — I272 Pulmonary hypertension, unspecified: Secondary | ICD-10-CM | POA: Diagnosis not present

## 2018-04-05 DIAGNOSIS — R52 Pain, unspecified: Secondary | ICD-10-CM | POA: Diagnosis not present

## 2018-04-05 DIAGNOSIS — I509 Heart failure, unspecified: Secondary | ICD-10-CM

## 2018-04-05 DIAGNOSIS — M8000XA Age-related osteoporosis with current pathological fracture, unspecified site, initial encounter for fracture: Secondary | ICD-10-CM | POA: Diagnosis not present

## 2018-04-05 DIAGNOSIS — C3491 Malignant neoplasm of unspecified part of right bronchus or lung: Secondary | ICD-10-CM

## 2018-04-05 DIAGNOSIS — J309 Allergic rhinitis, unspecified: Secondary | ICD-10-CM | POA: Diagnosis not present

## 2018-04-05 DIAGNOSIS — D509 Iron deficiency anemia, unspecified: Secondary | ICD-10-CM | POA: Diagnosis not present

## 2018-04-05 DIAGNOSIS — Z79899 Other long term (current) drug therapy: Secondary | ICD-10-CM | POA: Diagnosis not present

## 2018-04-05 DIAGNOSIS — I10 Essential (primary) hypertension: Secondary | ICD-10-CM | POA: Diagnosis not present

## 2018-04-05 DIAGNOSIS — C3411 Malignant neoplasm of upper lobe, right bronchus or lung: Secondary | ICD-10-CM

## 2018-04-05 DIAGNOSIS — M6281 Muscle weakness (generalized): Secondary | ICD-10-CM | POA: Diagnosis not present

## 2018-04-05 NOTE — Telephone Encounter (Signed)
Appointments scheduled AVS/Calendar printed per 5/30 los °

## 2018-04-05 NOTE — Assessment & Plan Note (Signed)
This is a very pleasant 84 year old white female diagnosed with metastatic non-small cell lung cancer, adenocarcinoma with right upper lobe lung nodules in addition to smaller bilateral pulmonary nodules and positive MET mutations. She is status post stereotactic radiotherapy to the right upper lobe pulmonary nodule and has been observation since that time. She had a recent CT scan of the chest and is here to discuss the results.  The patient was seen with Dr. Julien Nordmann.  CT scan results were discussed with the patient and her granddaughter. Her recent CT scan of the chest showed no evidence for disease progression. I discussed the scan results with the patient today. Recommend for her to continue observation.  She will follow-up in 1 year with a restaging CT scan of the chest prior to that visit.  She was advised to call immediately if she has any concerning symptoms in the interval. The patient voices understanding of current disease status and treatment options and is in agreement with the current care plan. All questions were answered. The patient knows to call the clinic with any problems, questions or concerns. We can certainly see the patient much sooner if necessary.

## 2018-04-05 NOTE — Progress Notes (Signed)
[facility] OFFICE PROGRESS NOTE  Crist Infante, MD [place] [address]  DIAGNOSIS: non-small cell lung cancer, adenocarcinoma, with positive MET mutation, with multiple pulmonary nodules but at least a stage IA (T1a, N0, M0) involving the right upper lobe, with other bilateral pulmonary nodule suspicious for metastatic disease or synchronous primaries.  MOLECULAR STUDIES: [facility]: Positive for MET exon 14 splice site ([id number]>Y, TET2 Q 1524*. Negative for EGFR, ALK, RET, BRAF, KRAS, ERBB2.  PRIOR THERAPY: Stereotactic radiotherapy to the right upper lobe lung nodule expected to be completed on 05/06/2014  CURRENT THERAPY: None  INTERVAL HISTORY: Michelle Levine 84 y.o. female returns for routine follow-up visit accompanied by her granddaughter.  The patient is feeling fine today with no specific complaints.  Since we saw last saw the patient she fractured her left hip and underwent surgery.  While in rehab she developed pneumonia and is now wearing oxygen.  She continues to reside at the skilled nursing facility.  The patient denies fevers and chills.  Denies chest pain, cough, hemoptysis.  She has her baseline shortness of breath.  Denies nausea, vomiting, constipation, diarrhea.  The patient had a recent CT scan and is here to discuss the results.  MEDICAL HISTORY: Past Medical History:  Diagnosis Date  . Aortic insufficiency   . CAD (coronary artery disease)   . Cancer ([place])   . Cataracts, bilateral   . CHF (congestive heart failure) ([place])   . COPD (chronic obstructive pulmonary disease) ([place])   . Hypertension   . Mitral insufficiency   . Pulmonary hypertension ([place])   . Thyroid disease   . Tricuspid regurgitation     ALLERGIES:  has No Known Allergies.  MEDICATIONS:  Current Outpatient Medications  Medication Sig Dispense Refill  . acetaminophen (TYLENOL) 325 MG tablet Take 1-2 tablets (325-650 mg total) by mouth every 6 (six) hours  as needed for mild pain (pain score 1-3 or temp > 100.5).    Marland Kitchen amLODipine (NORVASC) 5 MG tablet Take 5 mg by mouth daily.    Marland Kitchen aspirin EC 325 MG EC tablet Take 1 tablet (325 mg total) by mouth 2 (two) times daily. Take for 4 weeks, then resume regular dose. 60 tablet 0  . bisoprolol (ZEBETA) 5 MG tablet TAKE 1 TABLET BY MOUTH EVERY DAY 30 tablet 11  . Calcium Carbonate-Vitamin D (CALCIUM + D PO) Take 1 capsule by mouth daily.     . citalopram (CELEXA) 20 MG tablet Take 20 mg by mouth daily.     Marland Kitchen docusate sodium (COLACE) 100 MG capsule Take 1 capsule (100 mg total) by mouth 2 (two) times daily. 10 capsule 0  . ferrous sulfate 325 (65 FE) MG tablet Take 1 tablet (325 mg total) by mouth 3 (three) times daily after meals.  3  . furosemide (LASIX) 40 MG tablet Take 1 tablet (40 mg total) by mouth daily. 30 tablet   . galantamine (RAZADYNE) 8 MG tablet TAKE 1 TABLET BY MOUTH EACH MORNING WITH FOOD  11  . HYDROcodone-acetaminophen (NORCO/VICODIN) 5-325 MG tablet Take 1-2 tablets by mouth every 4 (four) hours as needed for moderate pain or severe pain. 60 tablet 0  . KLOR-CON 10 10 MEQ tablet Take 10 mEq by mouth daily.  3  . methimazole (TAPAZOLE) 10 MG tablet Take 5 mg by mouth daily.     . Multiple Vitamin (MULITIVITAMIN WITH MINERALS) TABS Take 1 tablet by mouth daily.     . zoledronic  acid (RECLAST) 5 MG/100ML SOLN Inject 5 mg into the vein. Reported on 11/30/2015    . fluticasone (FLONASE) 50 MCG/ACT nasal spray Place 1 spray into both nostrils daily as needed for allergies.     Marland Kitchen ipratropium-albuterol (DUONEB) 0.5-2.5 (3) MG/3ML SOLN Take 3 mLs by nebulization every 6 (six) hours as needed. (Patient not taking: Reported on 04/05/2018) 360 mL    No current facility-administered medications for this visit.     SURGICAL HISTORY:  Past Surgical History:  Procedure Laterality Date  . ABDOMINAL HYSTERECTOMY    . APPENDECTOMY    . BREAST LUMPECTOMY    . CATARACT EXTRACTION    . EYE SURGERY    .  FEMUR IM NAIL Left 01/30/2018   Procedure: INTRAMEDULLARY (IM) NAIL FEMORAL LEFT;  Surgeon: Paralee Cancel, MD;  Location: [facility];  Service: Orthopedics;  Laterality: Left;    REVIEW OF SYSTEMS:   Review of Systems  Constitutional: Negative for appetite change, chills, fatigue, fever and unexpected weight change.  HENT:   Negative for mouth sores, nosebleeds, sore throat and trouble swallowing.   Eyes: Negative for eye problems and icterus.  Respiratory: Negative for cough, hemoptysis, and wheezing.  She has baseline shortness of breath and wears home oxygen. Cardiovascular: Negative for chest pain and leg swelling.  Gastrointestinal: Negative for abdominal pain, constipation, diarrhea, nausea and vomiting.  Genitourinary: Negative for bladder incontinence, difficulty urinating, dysuria, frequency and hematuria.   Musculoskeletal: Negative for back pain, neck pain and neck stiffness.  Skin: Negative for itching and rash.  Neurological: Negative for dizziness, extremity weakness, headaches, light-headedness and seizures.  Hematological: Negative for adenopathy. Does not bruise/bleed easily.  Psychiatric/Behavioral: Negative for confusion, depression and sleep disturbance. The patient is not nervous/anxious.     PHYSICAL EXAMINATION:  Blood pressure 131/80, pulse 72, temperature 98.5 F (36.9 C), temperature source Oral, resp. rate 17, height 5' 5"  (1.651 m), weight 124 lb 9.6 oz (56.5 kg), SpO2 96 %.  ECOG PERFORMANCE STATUS: 1 - Symptomatic but completely ambulatory  Physical Exam  Constitutional: Oriented to person, place, and time and well-developed, well-nourished, and in no distress. No distress.  HENT:  Head: Normocephalic and atraumatic.  Mouth/Throat: Oropharynx is clear and moist. No oropharyngeal exudate.  Eyes: Conjunctivae are normal. Right eye exhibits no discharge. Left eye exhibits no discharge. No scleral icterus.  Neck: Normal range of motion. Neck supple.   Cardiovascular: Normal rate, regular rhythm, normal heart sounds and intact distal pulses.   Pulmonary/Chest: Effort normal and breath sounds normal. No respiratory distress. No wheezes. No rales.  Abdominal: Soft. Bowel sounds are normal. Exhibits no distension and no mass. There is no tenderness.  Musculoskeletal: Normal range of motion. Exhibits no edema.  Lymphadenopathy:    No cervical adenopathy.  Neurological: Alert and oriented to person, place, and time. Exhibits normal muscle tone. Gait normal. Coordination normal.  Skin: Skin is warm and dry. No rash noted. Not diaphoretic. No erythema. No pallor.  Psychiatric: Mood, memory and judgment normal.  Vitals reviewed.  LABORATORY DATA: Lab Results  Component Value Date   WBC 6.3 04/03/2018   HGB 11.6 04/03/2018   HCT 36.5 04/03/2018   MCV 93.4 04/03/2018   PLT 196 04/03/2018      Chemistry      Component Value Date/Time   NA 142 04/03/2018 1252   NA 143 02/15/2017 0941   K 3.9 04/03/2018 1252   K 4.2 02/15/2017 0941   CL 105 04/03/2018 1252   CO2 30 (  H) 04/03/2018 1252   CO2 28 02/15/2017 0941   BUN 15 04/03/2018 1252   BUN 18.9 02/15/2017 0941   CREATININE 0.94 04/03/2018 1252   CREATININE 0.9 02/15/2017 0941      Component Value Date/Time   CALCIUM 9.5 04/03/2018 1252   CALCIUM 9.7 02/15/2017 0941   ALKPHOS 79 04/03/2018 1252   ALKPHOS 74 02/15/2017 0941   AST 24 04/03/2018 1252   AST 29 02/15/2017 0941   ALT 19 04/03/2018 1252   ALT 19 02/15/2017 0941   BILITOT 0.6 04/03/2018 1252   BILITOT 0.56 02/15/2017 0941       RADIOGRAPHIC STUDIES:  Ct Angio Chest Pe W Or Wo Contrast  Result Date: 03/07/2018 CLINICAL DATA:  84 year old female with a history of recent pneumonia EXAM: CT ANGIOGRAPHY CHEST WITH CONTRAST TECHNIQUE: Multidetector CT imaging of the chest was performed using the standard protocol during bolus administration of intravenous contrast. Multiplanar CT image reconstructions and MIPs were  obtained to evaluate the vascular anatomy. CONTRAST:  13m ISOVUE-370 IOPAMIDOL (ISOVUE-370) INJECTION 76% COMPARISON:  CT 02/21/2017, plain film 01/29/2018 FINDINGS: Cardiovascular: Heart: Redemonstration of cardiomegaly, within large min of all chambers. Calcifications of papillary muscles. No pericardial fluid/thickening. Aorta: Calcifications of the aortic arch. Calcifications of descending thoracic aorta. Pulmonary arteries: No central, lobar, segmental, or proximal subsegmental filling defects. Mediastinum/Nodes: Prevascular node/soft tissues again demonstrated, which measures slightly larger than the comparison CT. This was previously 12 mm, currently 16 mm-17 mm. Streak artifact somewhat limits evaluation of additional paratracheal nodes, however, the nodes are persisting on the current CT. Estimated diameter of lowest paratracheal node measures 12 mm, similar to prior. Unremarkable appearance of the thoracic esophagus. Unremarkable appearance of the thoracic inlet. Multinodular thyroid again demonstrated. Lungs/Pleura: Interval development of bilateral pleural effusions. Interval development of lung volume loss/atelectasis at the left lung base, predominantly involving the medial and posterior basal segments. Similar appearance of consolidation of the upper right lung measuring 4.2 cm. Unchanged nodule at the right lung base, measuring 5 mm (image 28 of series 6). Unchanged scarring of the anterior right upper lobe with associated with the fissure. No interlobular septal thickening.  No confluent airspace disease. Upper Abdomen: No acute. Musculoskeletal: Accentuated kyphotic curvature. No acute displaced fracture. Diffuse osteopenia. Similar appearance of deformity of the sternum. No bony canal narrowing. Review of the MIP images confirms the above findings. IMPRESSION: Study is negative for pulmonary emboli. Bilateral pleural effusions with associated atelectasis. Volume loss/atelectasis of the left lower  lobe involving medial and posterior basal segments. Follow-up chest x-ray may be useful to assure resolution in 6 weeks. Mediastinal lymph nodes again demonstrated. The prevascular lymph node measures slightly larger on the current, 16 mm compared to prior 12 mm, potentially reactive. Nodular changes/scarring of the right upper lobe persist, potentially post treatment. Unchanged 5 mm nodule at the right lung base. Electronically Signed   By: JCorrie MckusickD.O.   On: 03/07/2018 16:39     ASSESSMENT/PLAN:  Bronchogenic cancer of right lung This is a very pleasant 84year old white female diagnosed with metastatic non-small cell lung cancer, adenocarcinoma with right upper lobe lung nodules in addition to smaller bilateral pulmonary nodules and positive MET mutations. She is status post stereotactic radiotherapy to the right upper lobe pulmonary nodule and has been observation since that time. She had a recent CT scan of the chest and is here to discuss the results.  The patient was seen with Dr. MJulien Nordmann  CT scan results were discussed with  the patient and her granddaughter. Her recent CT scan of the chest showed no evidence for disease progression. I discussed the scan results with the patient today. Recommend for her to continue observation.  She will follow-up in 1 year with a restaging CT scan of the chest prior to that visit.  She was advised to call immediately if she has any concerning symptoms in the interval. The patient voices understanding of current disease status and treatment options and is in agreement with the current care plan. All questions were answered. The patient knows to call the clinic with any problems, questions or concerns. We can certainly see the patient much sooner if necessary.   Orders Placed This Encounter  Procedures  . CT CHEST W CONTRAST    Standing Status:   Future    Standing Expiration Date:   04/12/2019    Order Specific Question:   If indicated for the  ordered procedure, I authorize the administration of contrast media per Radiology protocol    Answer:   Yes    Order Specific Question:   Preferred imaging location?    Answer:   [facility]    Order Specific Question:   Radiology Contrast Protocol - do NOT remove file path    Answer:   [email or web address]    Order Specific Question:   Reason for Exam additional comments    Answer:   Lung cancer. Restaging.  Marland Kitchen CBC with Differential (Cancer Center Only)    Standing Status:   Future    Standing Expiration Date:   04/15/2019  . CMP ([facility] only)    Standing Status:   Future    Standing Expiration Date:   05/13/2019   Mikey Bussing, [place], AGPCNP-BC, AOCNP 04/05/18  ADDENDUM: Hematology/Oncology Attending: I had a face-to-face encounter with the patient.  I recommended her care plan.  This is a very pleasant 84 years old white female with metastatic non-small cell lung cancer, adenocarcinoma presented with bilateral pulmonary nodules and positive MET mutations.  She is status post stereotactic radiotherapy to the right upper lobe pulmonary nodule and she is currently on observation.  Her recent CT scan of the chest showed no concerning findings for disease progression.  I discussed the scan results with the patient and recommended for her to continue in observation with repeat CT scan of the chest in 1 year. She was advised to call immediately if she has any concerning symptoms in the interval.  Disclaimer: This note was dictated with voice recognition software. Similar sounding words can inadvertently be transcribed and may be missed upon review. Eilleen Kempf, MD 04/06/18

## 2018-04-09 DIAGNOSIS — K5900 Constipation, unspecified: Secondary | ICD-10-CM | POA: Diagnosis not present

## 2018-04-09 DIAGNOSIS — R062 Wheezing: Secondary | ICD-10-CM | POA: Diagnosis not present

## 2018-04-09 DIAGNOSIS — E0590 Thyrotoxicosis, unspecified without thyrotoxic crisis or storm: Secondary | ICD-10-CM | POA: Diagnosis not present

## 2018-04-09 DIAGNOSIS — I509 Heart failure, unspecified: Secondary | ICD-10-CM | POA: Diagnosis not present

## 2018-04-09 DIAGNOSIS — M8000XA Age-related osteoporosis with current pathological fracture, unspecified site, initial encounter for fracture: Secondary | ICD-10-CM | POA: Diagnosis not present

## 2018-04-09 DIAGNOSIS — M6281 Muscle weakness (generalized): Secondary | ICD-10-CM | POA: Diagnosis not present

## 2018-04-09 DIAGNOSIS — N189 Chronic kidney disease, unspecified: Secondary | ICD-10-CM | POA: Diagnosis not present

## 2018-04-09 DIAGNOSIS — R52 Pain, unspecified: Secondary | ICD-10-CM | POA: Diagnosis not present

## 2018-04-09 DIAGNOSIS — I10 Essential (primary) hypertension: Secondary | ICD-10-CM | POA: Diagnosis not present

## 2018-04-09 DIAGNOSIS — J309 Allergic rhinitis, unspecified: Secondary | ICD-10-CM | POA: Diagnosis not present

## 2018-04-09 DIAGNOSIS — J449 Chronic obstructive pulmonary disease, unspecified: Secondary | ICD-10-CM | POA: Diagnosis not present

## 2018-04-09 DIAGNOSIS — D509 Iron deficiency anemia, unspecified: Secondary | ICD-10-CM | POA: Diagnosis not present

## 2018-04-10 DIAGNOSIS — G3184 Mild cognitive impairment, so stated: Secondary | ICD-10-CM | POA: Diagnosis not present

## 2018-04-10 DIAGNOSIS — D509 Iron deficiency anemia, unspecified: Secondary | ICD-10-CM | POA: Diagnosis not present

## 2018-04-10 DIAGNOSIS — J449 Chronic obstructive pulmonary disease, unspecified: Secondary | ICD-10-CM | POA: Diagnosis not present

## 2018-04-10 DIAGNOSIS — E0590 Thyrotoxicosis, unspecified without thyrotoxic crisis or storm: Secondary | ICD-10-CM | POA: Diagnosis not present

## 2018-04-10 DIAGNOSIS — N189 Chronic kidney disease, unspecified: Secondary | ICD-10-CM | POA: Diagnosis not present

## 2018-04-10 DIAGNOSIS — I509 Heart failure, unspecified: Secondary | ICD-10-CM | POA: Diagnosis not present

## 2018-04-10 DIAGNOSIS — R52 Pain, unspecified: Secondary | ICD-10-CM | POA: Diagnosis not present

## 2018-04-10 DIAGNOSIS — M6281 Muscle weakness (generalized): Secondary | ICD-10-CM | POA: Diagnosis not present

## 2018-04-10 DIAGNOSIS — J309 Allergic rhinitis, unspecified: Secondary | ICD-10-CM | POA: Diagnosis not present

## 2018-04-10 DIAGNOSIS — M8000XA Age-related osteoporosis with current pathological fracture, unspecified site, initial encounter for fracture: Secondary | ICD-10-CM | POA: Diagnosis not present

## 2018-04-10 DIAGNOSIS — I10 Essential (primary) hypertension: Secondary | ICD-10-CM | POA: Diagnosis not present

## 2018-04-10 DIAGNOSIS — K5900 Constipation, unspecified: Secondary | ICD-10-CM | POA: Diagnosis not present

## 2018-04-11 DIAGNOSIS — J449 Chronic obstructive pulmonary disease, unspecified: Secondary | ICD-10-CM | POA: Diagnosis not present

## 2018-04-11 DIAGNOSIS — S7290XD Unspecified fracture of unspecified femur, subsequent encounter for closed fracture with routine healing: Secondary | ICD-10-CM | POA: Diagnosis not present

## 2018-04-11 DIAGNOSIS — M6281 Muscle weakness (generalized): Secondary | ICD-10-CM | POA: Diagnosis not present

## 2018-04-11 DIAGNOSIS — R262 Difficulty in walking, not elsewhere classified: Secondary | ICD-10-CM | POA: Diagnosis not present

## 2018-04-11 DIAGNOSIS — J129 Viral pneumonia, unspecified: Secondary | ICD-10-CM | POA: Diagnosis not present

## 2018-04-12 ENCOUNTER — Encounter: Payer: Self-pay | Admitting: Physician Assistant

## 2018-04-12 ENCOUNTER — Ambulatory Visit (INDEPENDENT_AMBULATORY_CARE_PROVIDER_SITE_OTHER): Payer: Medicare Other | Admitting: Physician Assistant

## 2018-04-12 VITALS — BP 147/77 | HR 56 | Ht 63.0 in | Wt 118.3 lb

## 2018-04-12 DIAGNOSIS — C3491 Malignant neoplasm of unspecified part of right bronchus or lung: Secondary | ICD-10-CM | POA: Diagnosis not present

## 2018-04-12 DIAGNOSIS — I10 Essential (primary) hypertension: Secondary | ICD-10-CM | POA: Diagnosis not present

## 2018-04-12 DIAGNOSIS — R262 Difficulty in walking, not elsewhere classified: Secondary | ICD-10-CM | POA: Diagnosis not present

## 2018-04-12 DIAGNOSIS — I351 Nonrheumatic aortic (valve) insufficiency: Secondary | ICD-10-CM

## 2018-04-12 DIAGNOSIS — I4891 Unspecified atrial fibrillation: Secondary | ICD-10-CM | POA: Diagnosis not present

## 2018-04-12 DIAGNOSIS — S7290XD Unspecified fracture of unspecified femur, subsequent encounter for closed fracture with routine healing: Secondary | ICD-10-CM | POA: Diagnosis not present

## 2018-04-12 DIAGNOSIS — J449 Chronic obstructive pulmonary disease, unspecified: Secondary | ICD-10-CM | POA: Diagnosis not present

## 2018-04-12 DIAGNOSIS — M6281 Muscle weakness (generalized): Secondary | ICD-10-CM | POA: Diagnosis not present

## 2018-04-12 DIAGNOSIS — J129 Viral pneumonia, unspecified: Secondary | ICD-10-CM | POA: Diagnosis not present

## 2018-04-12 DIAGNOSIS — E039 Hypothyroidism, unspecified: Secondary | ICD-10-CM

## 2018-04-12 MED ORDER — APIXABAN 2.5 MG PO TABS: 2.5000 mg | ORAL_TABLET | Freq: Two times a day (BID) | ORAL | 5 refills | Status: AC

## 2018-04-12 MED ORDER — BISOPROLOL FUMARATE 10 MG PO TABS: 5.0000 mg | ORAL_TABLET | Freq: Every day | ORAL | 5 refills | Status: AC

## 2018-04-12 NOTE — Patient Instructions (Signed)
Medication Instructions:  STOP ASPIRIN  START ELIQUIS 2.5MG  TWICE DAILY  START BISOPROLOL 10MG  DAILY  If you need a refill on your cardiac medications before your next appointment, please call your pharmacy.  Follow-Up: Your physician wants you to follow-up in: [place]    Thank you for choosing [facility] at [facility]!!
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
## 2018-04-13 DIAGNOSIS — J449 Chronic obstructive pulmonary disease, unspecified: Secondary | ICD-10-CM | POA: Diagnosis not present

## 2018-04-13 DIAGNOSIS — S7290XD Unspecified fracture of unspecified femur, subsequent encounter for closed fracture with routine healing: Secondary | ICD-10-CM | POA: Diagnosis not present

## 2018-04-13 DIAGNOSIS — J129 Viral pneumonia, unspecified: Secondary | ICD-10-CM | POA: Diagnosis not present

## 2018-04-13 DIAGNOSIS — M6281 Muscle weakness (generalized): Secondary | ICD-10-CM | POA: Diagnosis not present

## 2018-04-13 DIAGNOSIS — R262 Difficulty in walking, not elsewhere classified: Secondary | ICD-10-CM | POA: Diagnosis not present

## 2018-04-16 DIAGNOSIS — S7290XD Unspecified fracture of unspecified femur, subsequent encounter for closed fracture with routine healing: Secondary | ICD-10-CM | POA: Diagnosis not present

## 2018-04-16 DIAGNOSIS — R262 Difficulty in walking, not elsewhere classified: Secondary | ICD-10-CM | POA: Diagnosis not present

## 2018-04-16 DIAGNOSIS — J449 Chronic obstructive pulmonary disease, unspecified: Secondary | ICD-10-CM | POA: Diagnosis not present

## 2018-04-16 DIAGNOSIS — M6281 Muscle weakness (generalized): Secondary | ICD-10-CM | POA: Diagnosis not present

## 2018-04-16 DIAGNOSIS — J129 Viral pneumonia, unspecified: Secondary | ICD-10-CM | POA: Diagnosis not present

## 2018-04-17 DIAGNOSIS — J129 Viral pneumonia, unspecified: Secondary | ICD-10-CM | POA: Diagnosis not present

## 2018-04-17 DIAGNOSIS — M6281 Muscle weakness (generalized): Secondary | ICD-10-CM | POA: Diagnosis not present

## 2018-04-17 DIAGNOSIS — F0280 Dementia in other diseases classified elsewhere without behavioral disturbance: Secondary | ICD-10-CM | POA: Diagnosis not present

## 2018-04-17 DIAGNOSIS — S7290XD Unspecified fracture of unspecified femur, subsequent encounter for closed fracture with routine healing: Secondary | ICD-10-CM | POA: Diagnosis not present

## 2018-04-17 DIAGNOSIS — R262 Difficulty in walking, not elsewhere classified: Secondary | ICD-10-CM | POA: Diagnosis not present

## 2018-04-17 DIAGNOSIS — G3184 Mild cognitive impairment, so stated: Secondary | ICD-10-CM | POA: Diagnosis not present

## 2018-04-17 DIAGNOSIS — J449 Chronic obstructive pulmonary disease, unspecified: Secondary | ICD-10-CM | POA: Diagnosis not present

## 2018-04-18 DIAGNOSIS — R262 Difficulty in walking, not elsewhere classified: Secondary | ICD-10-CM | POA: Diagnosis not present

## 2018-04-18 DIAGNOSIS — S7290XD Unspecified fracture of unspecified femur, subsequent encounter for closed fracture with routine healing: Secondary | ICD-10-CM | POA: Diagnosis not present

## 2018-04-18 DIAGNOSIS — M6281 Muscle weakness (generalized): Secondary | ICD-10-CM | POA: Diagnosis not present

## 2018-04-18 DIAGNOSIS — J449 Chronic obstructive pulmonary disease, unspecified: Secondary | ICD-10-CM | POA: Diagnosis not present

## 2018-04-18 DIAGNOSIS — G4700 Insomnia, unspecified: Secondary | ICD-10-CM | POA: Diagnosis not present

## 2018-04-18 DIAGNOSIS — J129 Viral pneumonia, unspecified: Secondary | ICD-10-CM | POA: Diagnosis not present

## 2018-04-19 DIAGNOSIS — J449 Chronic obstructive pulmonary disease, unspecified: Secondary | ICD-10-CM | POA: Diagnosis not present

## 2018-04-19 DIAGNOSIS — J129 Viral pneumonia, unspecified: Secondary | ICD-10-CM | POA: Diagnosis not present

## 2018-04-19 DIAGNOSIS — R262 Difficulty in walking, not elsewhere classified: Secondary | ICD-10-CM | POA: Diagnosis not present

## 2018-04-19 DIAGNOSIS — M6281 Muscle weakness (generalized): Secondary | ICD-10-CM | POA: Diagnosis not present

## 2018-04-19 DIAGNOSIS — S7290XD Unspecified fracture of unspecified femur, subsequent encounter for closed fracture with routine healing: Secondary | ICD-10-CM | POA: Diagnosis not present

## 2018-04-20 DIAGNOSIS — J449 Chronic obstructive pulmonary disease, unspecified: Secondary | ICD-10-CM | POA: Diagnosis not present

## 2018-04-20 DIAGNOSIS — N189 Chronic kidney disease, unspecified: Secondary | ICD-10-CM | POA: Diagnosis not present

## 2018-04-20 DIAGNOSIS — M818 Other osteoporosis without current pathological fracture: Secondary | ICD-10-CM | POA: Diagnosis not present

## 2018-04-20 DIAGNOSIS — R262 Difficulty in walking, not elsewhere classified: Secondary | ICD-10-CM | POA: Diagnosis not present

## 2018-04-20 DIAGNOSIS — K5900 Constipation, unspecified: Secondary | ICD-10-CM | POA: Diagnosis not present

## 2018-04-20 DIAGNOSIS — M8000XA Age-related osteoporosis with current pathological fracture, unspecified site, initial encounter for fracture: Secondary | ICD-10-CM | POA: Diagnosis not present

## 2018-04-20 DIAGNOSIS — J309 Allergic rhinitis, unspecified: Secondary | ICD-10-CM | POA: Diagnosis not present

## 2018-04-20 DIAGNOSIS — M6281 Muscle weakness (generalized): Secondary | ICD-10-CM | POA: Diagnosis not present

## 2018-04-20 DIAGNOSIS — I509 Heart failure, unspecified: Secondary | ICD-10-CM | POA: Diagnosis not present

## 2018-04-20 DIAGNOSIS — S7290XD Unspecified fracture of unspecified femur, subsequent encounter for closed fracture with routine healing: Secondary | ICD-10-CM | POA: Diagnosis not present

## 2018-04-20 DIAGNOSIS — D509 Iron deficiency anemia, unspecified: Secondary | ICD-10-CM | POA: Diagnosis not present

## 2018-04-20 DIAGNOSIS — E0590 Thyrotoxicosis, unspecified without thyrotoxic crisis or storm: Secondary | ICD-10-CM | POA: Diagnosis not present

## 2018-04-20 DIAGNOSIS — I10 Essential (primary) hypertension: Secondary | ICD-10-CM | POA: Diagnosis not present

## 2018-04-20 DIAGNOSIS — R52 Pain, unspecified: Secondary | ICD-10-CM | POA: Diagnosis not present

## 2018-04-20 DIAGNOSIS — J129 Viral pneumonia, unspecified: Secondary | ICD-10-CM | POA: Diagnosis not present

## 2018-04-22 DIAGNOSIS — J129 Viral pneumonia, unspecified: Secondary | ICD-10-CM | POA: Diagnosis not present

## 2018-04-22 DIAGNOSIS — R262 Difficulty in walking, not elsewhere classified: Secondary | ICD-10-CM | POA: Diagnosis not present

## 2018-04-22 DIAGNOSIS — J449 Chronic obstructive pulmonary disease, unspecified: Secondary | ICD-10-CM | POA: Diagnosis not present

## 2018-04-22 DIAGNOSIS — M6281 Muscle weakness (generalized): Secondary | ICD-10-CM | POA: Diagnosis not present

## 2018-04-22 DIAGNOSIS — S7290XD Unspecified fracture of unspecified femur, subsequent encounter for closed fracture with routine healing: Secondary | ICD-10-CM | POA: Diagnosis not present

## 2018-04-23 DIAGNOSIS — M6281 Muscle weakness (generalized): Secondary | ICD-10-CM | POA: Diagnosis not present

## 2018-04-23 DIAGNOSIS — J449 Chronic obstructive pulmonary disease, unspecified: Secondary | ICD-10-CM | POA: Diagnosis not present

## 2018-04-23 DIAGNOSIS — R262 Difficulty in walking, not elsewhere classified: Secondary | ICD-10-CM | POA: Diagnosis not present

## 2018-04-23 DIAGNOSIS — S7290XD Unspecified fracture of unspecified femur, subsequent encounter for closed fracture with routine healing: Secondary | ICD-10-CM | POA: Diagnosis not present

## 2018-04-23 DIAGNOSIS — J129 Viral pneumonia, unspecified: Secondary | ICD-10-CM | POA: Diagnosis not present

## 2018-04-24 DIAGNOSIS — J449 Chronic obstructive pulmonary disease, unspecified: Secondary | ICD-10-CM | POA: Diagnosis not present

## 2018-04-24 DIAGNOSIS — S7290XD Unspecified fracture of unspecified femur, subsequent encounter for closed fracture with routine healing: Secondary | ICD-10-CM | POA: Diagnosis not present

## 2018-04-24 DIAGNOSIS — R0902 Hypoxemia: Secondary | ICD-10-CM | POA: Diagnosis not present

## 2018-04-24 DIAGNOSIS — R52 Pain, unspecified: Secondary | ICD-10-CM | POA: Diagnosis not present

## 2018-04-24 DIAGNOSIS — R262 Difficulty in walking, not elsewhere classified: Secondary | ICD-10-CM | POA: Diagnosis not present

## 2018-04-24 DIAGNOSIS — R05 Cough: Secondary | ICD-10-CM | POA: Diagnosis not present

## 2018-04-24 DIAGNOSIS — M6281 Muscle weakness (generalized): Secondary | ICD-10-CM | POA: Diagnosis not present

## 2018-04-24 DIAGNOSIS — R0989 Other specified symptoms and signs involving the circulatory and respiratory systems: Secondary | ICD-10-CM | POA: Diagnosis not present

## 2018-04-24 DIAGNOSIS — J129 Viral pneumonia, unspecified: Secondary | ICD-10-CM | POA: Diagnosis not present

## 2018-04-25 DIAGNOSIS — R0600 Dyspnea, unspecified: Secondary | ICD-10-CM | POA: Diagnosis not present

## 2018-04-25 DIAGNOSIS — R0902 Hypoxemia: Secondary | ICD-10-CM | POA: Diagnosis not present

## 2018-04-25 DIAGNOSIS — J449 Chronic obstructive pulmonary disease, unspecified: Secondary | ICD-10-CM | POA: Diagnosis not present

## 2018-04-25 DIAGNOSIS — S7290XD Unspecified fracture of unspecified femur, subsequent encounter for closed fracture with routine healing: Secondary | ICD-10-CM | POA: Diagnosis not present

## 2018-04-25 DIAGNOSIS — J129 Viral pneumonia, unspecified: Secondary | ICD-10-CM | POA: Diagnosis not present

## 2018-04-25 DIAGNOSIS — J441 Chronic obstructive pulmonary disease with (acute) exacerbation: Secondary | ICD-10-CM | POA: Diagnosis not present

## 2018-04-25 DIAGNOSIS — R262 Difficulty in walking, not elsewhere classified: Secondary | ICD-10-CM | POA: Diagnosis not present

## 2018-04-25 DIAGNOSIS — M6281 Muscle weakness (generalized): Secondary | ICD-10-CM | POA: Diagnosis not present

## 2018-04-25 DIAGNOSIS — J90 Pleural effusion, not elsewhere classified: Secondary | ICD-10-CM | POA: Diagnosis not present

## 2018-04-25 DIAGNOSIS — I517 Cardiomegaly: Secondary | ICD-10-CM | POA: Diagnosis not present

## 2018-04-26 DIAGNOSIS — R262 Difficulty in walking, not elsewhere classified: Secondary | ICD-10-CM | POA: Diagnosis not present

## 2018-04-26 DIAGNOSIS — J129 Viral pneumonia, unspecified: Secondary | ICD-10-CM | POA: Diagnosis not present

## 2018-04-26 DIAGNOSIS — M6281 Muscle weakness (generalized): Secondary | ICD-10-CM | POA: Diagnosis not present

## 2018-04-26 DIAGNOSIS — J449 Chronic obstructive pulmonary disease, unspecified: Secondary | ICD-10-CM | POA: Diagnosis not present

## 2018-04-26 DIAGNOSIS — S7290XD Unspecified fracture of unspecified femur, subsequent encounter for closed fracture with routine healing: Secondary | ICD-10-CM | POA: Diagnosis not present

## 2018-04-27 ENCOUNTER — Ambulatory Visit: Payer: Medicare Other | Admitting: Pulmonary Disease

## 2018-04-27 DIAGNOSIS — J129 Viral pneumonia, unspecified: Secondary | ICD-10-CM | POA: Diagnosis not present

## 2018-04-27 DIAGNOSIS — M6281 Muscle weakness (generalized): Secondary | ICD-10-CM | POA: Diagnosis not present

## 2018-04-27 DIAGNOSIS — R262 Difficulty in walking, not elsewhere classified: Secondary | ICD-10-CM | POA: Diagnosis not present

## 2018-04-27 DIAGNOSIS — J449 Chronic obstructive pulmonary disease, unspecified: Secondary | ICD-10-CM | POA: Diagnosis not present

## 2018-04-27 DIAGNOSIS — S7290XD Unspecified fracture of unspecified femur, subsequent encounter for closed fracture with routine healing: Secondary | ICD-10-CM | POA: Diagnosis not present

## 2018-04-29 DIAGNOSIS — M6281 Muscle weakness (generalized): Secondary | ICD-10-CM | POA: Diagnosis not present

## 2018-04-29 DIAGNOSIS — R262 Difficulty in walking, not elsewhere classified: Secondary | ICD-10-CM | POA: Diagnosis not present

## 2018-04-29 DIAGNOSIS — J449 Chronic obstructive pulmonary disease, unspecified: Secondary | ICD-10-CM | POA: Diagnosis not present

## 2018-04-29 DIAGNOSIS — J129 Viral pneumonia, unspecified: Secondary | ICD-10-CM | POA: Diagnosis not present

## 2018-04-29 DIAGNOSIS — S7290XD Unspecified fracture of unspecified femur, subsequent encounter for closed fracture with routine healing: Secondary | ICD-10-CM | POA: Diagnosis not present

## 2018-04-29 NOTE — Progress Notes (Signed)
@Patient  ID: Michelle Levine, female    DOB: 1922/10/08, 84 y.o.   MRN: [id number]  Chief Complaint  Patient presents with  . Acute Visit    Has had PNE and has been started on oxygen since moving into rehab facility. Currently taking rocephin IM and prednisone. Started on Eliquis for A-fib. Currently on 2L continous. Per facility patient is not sleeping well.     Referring provider: Crist Infante, MD  HPI: 84 yo female never smoker with non small cell cancer, adenocarcinoma dx [phone]  Dominant 1.8 cm RUL nodule with small bilateral pulmonary ndoules suspcious for mets. S/p XRT to RUL nodule in 04/2014    Recent [facility] Encounters:   TEST  Echo in 9/14 showed moderate MR, moderate TR with RVSP of 47.  08/2015 CT chest >Progressive volume loss and wedge-shaped subpleural fibrosis/atelectasis posteriorly in the right upper lobe, attributed to prior radiation therapy.  2. No evidence of local recurrence or metastatic disease.   CT chest 02/2016 >stable , no evidence of local recurrence or mets, right apical wedge shaped consolidation attributed to XRT , unchanged scattered lung nodules.    09/19/2016 Follow up: Chronic Cough /Lung cancer / Pt returns for 6 week follow up .  Seen last ov with cough /bronchitis flare . Tx w/ zpack and steroid taper.  Nadolol was changed to bisoprolol.  She is feeling much better. Cough is decreased a lot . Has minimal mucus production   Tolerating bisoprolol . B/p today is good.  CXR last ov w/ no change in chronic changes.    Chart Review:     04/30/18 OV Pleasant 84 year old patient seen in office today.  Patient presented to our office at her suggestion of granddaughter who is with her.  Wanting to update [place] that she is now on 2 L continuous oxygen, as well as wanting to be walked for POC.  Patient is currently at assisted living [facility].  Patient was recently treated for upper respiratory infection in April/2019.  Patient was  treated with IM Rocephin as well as prednisone taper. Chest XRAY radiology report that pt brought with her should small pleural effusions.   Patient reporting that she is been having some issues with sleeping, they have tried melatonin this did not help.  Patient and granddaughter report the patient is feeling better after initial treatment with the antibiotics and prednisone taper.  They wanted to keep this appointment as they know that they have not been in the office in quite some time and wanted to make sure that she was getting routine follow-up care via pulmonary.  No Known Allergies  Immunization History  Administered Date(s) Administered  . Influenza Split 08/05/2014  . Influenza, High Dose Seasonal PF 09/02/2013, 08/05/2016, 08/09/2017  . Influenza-Unspecified 09/12/2015    Past Medical History:  Diagnosis Date  . Aortic insufficiency   . CAD (coronary artery disease)   . Cancer ([place])   . Cataracts, bilateral   . CHF (congestive heart failure) ([place])   . COPD (chronic obstructive pulmonary disease) ([place])   . Hypertension   . Mitral insufficiency   . Pulmonary hypertension ([place])   . Thyroid disease   . Tricuspid regurgitation     Tobacco History: Social History   Tobacco Use  Smoking Status Never Smoker  Smokeless Tobacco Never Used   Counseling given: Not Answered Continue not smoking.  Outpatient Encounter Medications as of 04/30/2018  Medication Sig  . acetaminophen (TYLENOL) 325 MG tablet Take 1-2 tablets (  325-650 mg total) by mouth every 6 (six) hours as needed for mild pain (pain score 1-3 or temp > 100.5).  Marland Kitchen amLODipine (NORVASC) 5 MG tablet Take 5 mg by mouth daily.  Marland Kitchen apixaban (ELIQUIS) 2.5 MG TABS tablet Take 1 tablet (2.5 mg total) by mouth 2 (two) times daily.  . bisoprolol (ZEBETA) 10 MG tablet Take 0.5 tablets (5 mg total) by mouth daily.  . Calcium Carbonate-Vitamin D (CALCIUM + D PO) Take 1 capsule by mouth daily.   . citalopram (CELEXA) 20 MG  tablet Take 20 mg by mouth daily.   Marland Kitchen docusate sodium (COLACE) 100 MG capsule Take 1 capsule (100 mg total) by mouth 2 (two) times daily.  . ferrous sulfate 325 (65 FE) MG tablet Take 1 tablet (325 mg total) by mouth 3 (three) times daily after meals.  . fluticasone (FLONASE) 50 MCG/ACT nasal spray Place 1 spray into both nostrils daily as needed for allergies.   . furosemide (LASIX) 40 MG tablet Take 1 tablet (40 mg total) by mouth daily.  Marland Kitchen galantamine (RAZADYNE) 8 MG tablet TAKE 1 TABLET BY MOUTH EACH MORNING WITH FOOD  . ipratropium-albuterol (DUONEB) 0.5-2.5 (3) MG/3ML SOLN Take 3 mLs by nebulization 3 (three) times daily.  Marland Kitchen KLOR-CON 10 10 MEQ tablet Take 10 mEq by mouth daily.  . Melatonin 3 MG CAPS Take 3 mg by mouth at bedtime.  . methimazole (TAPAZOLE) 10 MG tablet Take 5 mg by mouth daily.   . Multiple Vitamin (MULITIVITAMIN WITH MINERALS) TABS Take 1 tablet by mouth daily.   . zoledronic acid (RECLAST) 5 MG/100ML SOLN Inject 5 mg into the vein. Reported on 11/30/2015  . [DISCONTINUED] ipratropium-albuterol (DUONEB) 0.5-2.5 (3) MG/3ML SOLN Take 3 mLs by nebulization every 6 (six) hours as needed.  Marland Kitchen HYDROcodone-acetaminophen (NORCO/VICODIN) 5-325 MG tablet Take 1-2 tablets by mouth every 4 (four) hours as needed for moderate pain or severe pain. (Patient not taking: Reported on 04/30/2018)   No facility-administered encounter medications on file as of 04/30/2018.      Review of Systems  Constitutional: +fatigue   No  weight loss, night sweats,  fevers, chills HEENT:   No headaches,  Difficulty swallowing,  Tooth/dental problems, or  Sore throat, No sneezing, itching, ear ache, nasal congestion, post nasal drip  CV: +bilateral le edema No chest pain,  orthopnea, PND,  anasarca, dizziness, palpitations, syncope  GI: No heartburn, indigestion, abdominal pain, nausea, vomiting, diarrhea, change in bowel habits, loss of appetite, bloody stools Resp: +sob with exertion, dry cough No  shortness of breath at rest.  No excess mucus, no productive cough,  No coughing up of blood.  No change in color of mucus.  No wheezing.  No chest wall deformity Skin: no rash, lesions, no skin changes. GU: no dysuria, change in color of urine, no urgency or frequency.  No flank pain, no hematuria  MS:  No joint pain or swelling.  No decreased range of motion.  No back pain. Psych:  No change in mood or affect. No depression or anxiety.  No memory loss.   Physical Exam  BP 110/68   Pulse 85   Ht 5\' 5"  (1.651 m)   Wt 120 lb (54.4 kg)   SpO2 92%   BMI 19.97 kg/m   Wt Readings from Last 3 Encounters:  04/30/18 120 lb (54.4 kg)  04/12/18 118 lb 4.8 oz (53.7 kg)  04/05/18 124 lb 9.6 oz (56.5 kg)     GEN: A/Ox3; pleasant ,  NAD, well nourished, chronically ill, 2L [place] continuous    HEENT:  /AT,  EACs-clear, TMs-wnl, NOSE-clear, THROAT-clear, no lesions, no postnasal drip or exudate noted.   NECK:  Supple w/ fair ROM; no JVD;  no lymphadenopathy.    RESP:  +diminished air movement in lower lobes, deep breath illcits cough, no audible wheeze  no accessory muscle use, no dullness to percussion  CARD: +bilateral LE swelling with compression stockings on during exam with  Reg irregular, no m/r/g, pulses intact, no cyanosis or clubbing.  GI:   Soft & nt; nml bowel sounds; no organomegaly or masses detected.   Musco: Warm bil, no deformities or joint swelling noted.   Neuro: alert, no focal deficits noted.    Skin: Warm, thin, dry    Lab Results:  CBC    Component Value Date/Time   WBC 6.3 04/03/2018 1252   RBC 3.90 04/03/2018 1252   HGB 11.6 04/03/2018 1252   HGB 13.6 02/15/2017 0941   HCT 36.5 04/03/2018 1252   HCT 40.6 02/15/2017 0941   PLT 196 04/03/2018 1252   PLT 200 02/15/2017 0941   MCV 93.4 04/03/2018 1252   MCV 88.7 02/15/2017 0941   MCH 29.6 04/03/2018 1252   MCHC 31.7 04/03/2018 1252   RDW 17.6 (H) 04/03/2018 1252   RDW 13.9 02/15/2017 0941   LYMPHSABS  1.5 04/03/2018 1252   LYMPHSABS 1.7 02/15/2017 0941   MONOABS 0.6 04/03/2018 1252   MONOABS 0.8 02/15/2017 0941   EOSABS 0.2 04/03/2018 1252   EOSABS 0.2 02/15/2017 0941   BASOSABS 0.1 04/03/2018 1252   BASOSABS 0.1 02/15/2017 0941    BMET    Component Value Date/Time   NA 142 04/03/2018 1252   NA 143 02/15/2017 0941   K 3.9 04/03/2018 1252   K 4.2 02/15/2017 0941   CL 105 04/03/2018 1252   CO2 30 (H) 04/03/2018 1252   CO2 28 02/15/2017 0941   GLUCOSE 148 (H) 04/03/2018 1252   GLUCOSE 107 02/15/2017 0941   BUN 15 04/03/2018 1252   BUN 18.9 02/15/2017 0941   CREATININE 0.94 04/03/2018 1252   CREATININE 0.9 02/15/2017 0941   CALCIUM 9.5 04/03/2018 1252   CALCIUM 9.7 02/15/2017 0941   GFRNONAA 51 (L) 04/03/2018 1252   GFRAA 59 (L) 04/03/2018 1252    BNP No results found for: BNP  ProBNP    Component Value Date/Time   PROBNP 296.0 (H) 03/22/2010 0450    Imaging: No results found.   Assessment & Plan:   Pleasant patient seen in office today.  Patient to continue follow-up with our office.  Will do chest x-ray today.  Walk today for POC patient qualified.  Patient kept oxygen saturation greater than 87% on 3 L pulsed.   Patient to continue oxygen therapy 2 L continuous.  When using POC patient will need 3 L pulsed.  Patient to ensure oxygen saturations remain greater than 88%.  If unable to maintain patient to follow-up with our office.  Will change duoneb nebulizer's to 3 times a day scheduled to see if this helps at all with her lung functioning.  Attempted to do spirometry in office today patient struggled with process and was unable to fully complete.  Will bring patient back in 6 weeks to see Dr. Elsworth Soho get reestablished with him.   COPD mixed type ([place]) Spirometry in office today Duo nebs 3 times a day scheduled Follow-up with Dr. Elsworth Soho in 6 weeks Continue oxygen therapy at this time >>> 2  L continuous when able  >>>2 L pulsed with POC for added  mobility Chest Xray today   Chronic respiratory failure with hypoxia ([place]) Spirometry in office today Duo nebs 3 times a day scheduled Follow-up with Dr. Elsworth Soho in 6 weeks Continue oxygen therapy at this time >>> 2 L continuous when able  >>>3 L pulsed with POC for added mobility Chest Xray today      Lauraine Rinne, NP 04/30/2018

## 2018-04-30 ENCOUNTER — Ambulatory Visit (INDEPENDENT_AMBULATORY_CARE_PROVIDER_SITE_OTHER)
Admission: RE | Admit: 2018-04-30 | Discharge: 2018-04-30 | Disposition: A | Discharge status: Home / Self Care | Payer: Medicare Other | Source: Ambulatory Visit | Attending: Pulmonary Disease | Admitting: Pulmonary Disease

## 2018-04-30 ENCOUNTER — Encounter: Payer: Self-pay | Admitting: Pulmonary Disease

## 2018-04-30 ENCOUNTER — Ambulatory Visit (INDEPENDENT_AMBULATORY_CARE_PROVIDER_SITE_OTHER): Payer: Medicare Other | Admitting: Pulmonary Disease

## 2018-04-30 VITALS — BP 110/68 | HR 85 | Ht 65.0 in | Wt 120.0 lb

## 2018-04-30 DIAGNOSIS — J9611 Chronic respiratory failure with hypoxia: Secondary | ICD-10-CM

## 2018-04-30 DIAGNOSIS — J449 Chronic obstructive pulmonary disease, unspecified: Secondary | ICD-10-CM

## 2018-04-30 DIAGNOSIS — M6281 Muscle weakness (generalized): Secondary | ICD-10-CM | POA: Diagnosis not present

## 2018-04-30 DIAGNOSIS — R262 Difficulty in walking, not elsewhere classified: Secondary | ICD-10-CM | POA: Diagnosis not present

## 2018-04-30 DIAGNOSIS — J129 Viral pneumonia, unspecified: Secondary | ICD-10-CM | POA: Diagnosis not present

## 2018-04-30 DIAGNOSIS — J90 Pleural effusion, not elsewhere classified: Secondary | ICD-10-CM | POA: Diagnosis not present

## 2018-04-30 DIAGNOSIS — S7290XD Unspecified fracture of unspecified femur, subsequent encounter for closed fracture with routine healing: Secondary | ICD-10-CM | POA: Diagnosis not present

## 2018-04-30 MED ORDER — IPRATROPIUM-ALBUTEROL 0.5-2.5 (3) MG/3ML IN SOLN: 3.0000 mL | Freq: Three times a day (TID) | RESPIRATORY_TRACT | 3 refills | Status: AC

## 2018-04-30 NOTE — Patient Instructions (Addendum)
Spirometry in office today Duo nebs 3 times a day scheduled Follow-up with Dr. Elsworth Soho in 6 weeks Continue oxygen therapy at this time >>> 2 L continuous when able  >>>3 L pulsed with POC for added mobility Chest Xray today    Please contact the office if your symptoms worsen or you have concerns that you are not improving.   Thank you for choosing [facility] for your healthcare, and for allowing [place] to partner with you on your healthcare journey. I am thankful to be able to provide care to you today.   Wyn Quaker FNP-C

## 2018-04-30 NOTE — Progress Notes (Signed)
Discussed results with patient in office.  Nothing further is needed at this time.  Michelle Kramlich FNP  

## 2018-04-30 NOTE — Assessment & Plan Note (Signed)
Spirometry in office today Duo nebs 3 times a day scheduled Follow-up with Dr. Elsworth Soho in 6 weeks Continue oxygen therapy at this time >>> 2 L continuous when able  >>>2 L pulsed with POC for added mobility Chest Xray today

## 2018-04-30 NOTE — Assessment & Plan Note (Addendum)
Spirometry in office today Duo nebs 3 times a day scheduled Follow-up with Dr. Elsworth Soho in 6 weeks Continue oxygen therapy at this time >>> 2 L continuous when able  >>>3 L pulsed with POC for added mobility Chest Xray today

## 2018-05-01 ENCOUNTER — Telehealth: Payer: Self-pay | Admitting: Pulmonary Disease

## 2018-05-01 DIAGNOSIS — J449 Chronic obstructive pulmonary disease, unspecified: Secondary | ICD-10-CM | POA: Diagnosis not present

## 2018-05-01 DIAGNOSIS — S7290XD Unspecified fracture of unspecified femur, subsequent encounter for closed fracture with routine healing: Secondary | ICD-10-CM | POA: Diagnosis not present

## 2018-05-01 DIAGNOSIS — R262 Difficulty in walking, not elsewhere classified: Secondary | ICD-10-CM | POA: Diagnosis not present

## 2018-05-01 DIAGNOSIS — J129 Viral pneumonia, unspecified: Secondary | ICD-10-CM | POA: Diagnosis not present

## 2018-05-01 DIAGNOSIS — M6281 Muscle weakness (generalized): Secondary | ICD-10-CM | POA: Diagnosis not present

## 2018-05-01 NOTE — Addendum Note (Signed)
Addended by: Vivia Ewing on: 05/01/2018 09:01 AM   Modules accepted: Orders

## 2018-05-01 NOTE — Telephone Encounter (Signed)
Ok order placed to be sent to [facility] skilled facility. Fax number provided in order. FYI PCC's

## 2018-05-01 NOTE — Telephone Encounter (Signed)
Order faxed to number provided confirmation received

## 2018-05-02 DIAGNOSIS — J129 Viral pneumonia, unspecified: Secondary | ICD-10-CM | POA: Diagnosis not present

## 2018-05-02 DIAGNOSIS — R262 Difficulty in walking, not elsewhere classified: Secondary | ICD-10-CM | POA: Diagnosis not present

## 2018-05-02 DIAGNOSIS — J449 Chronic obstructive pulmonary disease, unspecified: Secondary | ICD-10-CM | POA: Diagnosis not present

## 2018-05-02 DIAGNOSIS — M6281 Muscle weakness (generalized): Secondary | ICD-10-CM | POA: Diagnosis not present

## 2018-05-02 DIAGNOSIS — S7290XD Unspecified fracture of unspecified femur, subsequent encounter for closed fracture with routine healing: Secondary | ICD-10-CM | POA: Diagnosis not present

## 2018-05-03 DIAGNOSIS — S7290XD Unspecified fracture of unspecified femur, subsequent encounter for closed fracture with routine healing: Secondary | ICD-10-CM | POA: Diagnosis not present

## 2018-05-03 DIAGNOSIS — R262 Difficulty in walking, not elsewhere classified: Secondary | ICD-10-CM | POA: Diagnosis not present

## 2018-05-03 DIAGNOSIS — J129 Viral pneumonia, unspecified: Secondary | ICD-10-CM | POA: Diagnosis not present

## 2018-05-03 DIAGNOSIS — M6281 Muscle weakness (generalized): Secondary | ICD-10-CM | POA: Diagnosis not present

## 2018-05-03 DIAGNOSIS — J449 Chronic obstructive pulmonary disease, unspecified: Secondary | ICD-10-CM | POA: Diagnosis not present

## 2018-05-04 DIAGNOSIS — J449 Chronic obstructive pulmonary disease, unspecified: Secondary | ICD-10-CM | POA: Diagnosis not present

## 2018-05-04 DIAGNOSIS — S7290XD Unspecified fracture of unspecified femur, subsequent encounter for closed fracture with routine healing: Secondary | ICD-10-CM | POA: Diagnosis not present

## 2018-05-04 DIAGNOSIS — J129 Viral pneumonia, unspecified: Secondary | ICD-10-CM | POA: Diagnosis not present

## 2018-05-04 DIAGNOSIS — M6281 Muscle weakness (generalized): Secondary | ICD-10-CM | POA: Diagnosis not present

## 2018-05-04 DIAGNOSIS — R262 Difficulty in walking, not elsewhere classified: Secondary | ICD-10-CM | POA: Diagnosis not present

## 2018-05-04 NOTE — Progress Notes (Signed)
Reviewed & agree with plan  

## 2018-05-07 DIAGNOSIS — S7290XD Unspecified fracture of unspecified femur, subsequent encounter for closed fracture with routine healing: Secondary | ICD-10-CM | POA: Diagnosis not present

## 2018-05-07 DIAGNOSIS — J129 Viral pneumonia, unspecified: Secondary | ICD-10-CM | POA: Diagnosis not present

## 2018-05-07 DIAGNOSIS — J449 Chronic obstructive pulmonary disease, unspecified: Secondary | ICD-10-CM | POA: Diagnosis not present

## 2018-05-07 DIAGNOSIS — M6281 Muscle weakness (generalized): Secondary | ICD-10-CM | POA: Diagnosis not present

## 2018-05-07 DIAGNOSIS — R262 Difficulty in walking, not elsewhere classified: Secondary | ICD-10-CM | POA: Diagnosis not present

## 2018-05-08 DIAGNOSIS — S7290XD Unspecified fracture of unspecified femur, subsequent encounter for closed fracture with routine healing: Secondary | ICD-10-CM | POA: Diagnosis not present

## 2018-05-08 DIAGNOSIS — M6281 Muscle weakness (generalized): Secondary | ICD-10-CM | POA: Diagnosis not present

## 2018-05-08 DIAGNOSIS — J449 Chronic obstructive pulmonary disease, unspecified: Secondary | ICD-10-CM | POA: Diagnosis not present

## 2018-05-08 DIAGNOSIS — J129 Viral pneumonia, unspecified: Secondary | ICD-10-CM | POA: Diagnosis not present

## 2018-05-08 DIAGNOSIS — R262 Difficulty in walking, not elsewhere classified: Secondary | ICD-10-CM | POA: Diagnosis not present

## 2018-05-09 ENCOUNTER — Other Ambulatory Visit: Payer: Self-pay | Admitting: *Deleted

## 2018-05-09 DIAGNOSIS — J309 Allergic rhinitis, unspecified: Secondary | ICD-10-CM | POA: Diagnosis not present

## 2018-05-09 DIAGNOSIS — M8000XA Age-related osteoporosis with current pathological fracture, unspecified site, initial encounter for fracture: Secondary | ICD-10-CM | POA: Diagnosis not present

## 2018-05-09 DIAGNOSIS — M818 Other osteoporosis without current pathological fracture: Secondary | ICD-10-CM | POA: Diagnosis not present

## 2018-05-09 DIAGNOSIS — J449 Chronic obstructive pulmonary disease, unspecified: Secondary | ICD-10-CM | POA: Diagnosis not present

## 2018-05-09 DIAGNOSIS — J129 Viral pneumonia, unspecified: Secondary | ICD-10-CM | POA: Diagnosis not present

## 2018-05-09 DIAGNOSIS — R262 Difficulty in walking, not elsewhere classified: Secondary | ICD-10-CM | POA: Diagnosis not present

## 2018-05-09 DIAGNOSIS — K5900 Constipation, unspecified: Secondary | ICD-10-CM | POA: Diagnosis not present

## 2018-05-09 DIAGNOSIS — D509 Iron deficiency anemia, unspecified: Secondary | ICD-10-CM | POA: Diagnosis not present

## 2018-05-09 DIAGNOSIS — E0590 Thyrotoxicosis, unspecified without thyrotoxic crisis or storm: Secondary | ICD-10-CM | POA: Diagnosis not present

## 2018-05-09 DIAGNOSIS — S7290XD Unspecified fracture of unspecified femur, subsequent encounter for closed fracture with routine healing: Secondary | ICD-10-CM | POA: Diagnosis not present

## 2018-05-09 DIAGNOSIS — M6281 Muscle weakness (generalized): Secondary | ICD-10-CM | POA: Diagnosis not present

## 2018-05-09 DIAGNOSIS — N189 Chronic kidney disease, unspecified: Secondary | ICD-10-CM | POA: Diagnosis not present

## 2018-05-09 DIAGNOSIS — R52 Pain, unspecified: Secondary | ICD-10-CM | POA: Diagnosis not present

## 2018-05-09 DIAGNOSIS — I509 Heart failure, unspecified: Secondary | ICD-10-CM | POA: Diagnosis not present

## 2018-05-09 DIAGNOSIS — I10 Essential (primary) hypertension: Secondary | ICD-10-CM | POA: Diagnosis not present

## 2018-05-09 NOTE — Progress Notes (Signed)
Your chest x-ray results showing similar chronic findings to previous exams.  Increased right perihilar opacity we will repeat chest x-ray prior to your next appointment with Dr. Elsworth Soho.   Wyn Quaker FNP

## 2018-05-09 NOTE — Addendum Note (Signed)
Addended by: Lauraine Rinne on: 05/09/2018 08:40 AM   Modules accepted: Orders

## 2018-05-10 DIAGNOSIS — M6281 Muscle weakness (generalized): Secondary | ICD-10-CM | POA: Diagnosis not present

## 2018-05-10 DIAGNOSIS — J449 Chronic obstructive pulmonary disease, unspecified: Secondary | ICD-10-CM | POA: Diagnosis not present

## 2018-05-10 DIAGNOSIS — R262 Difficulty in walking, not elsewhere classified: Secondary | ICD-10-CM | POA: Diagnosis not present

## 2018-05-10 DIAGNOSIS — J129 Viral pneumonia, unspecified: Secondary | ICD-10-CM | POA: Diagnosis not present

## 2018-05-10 DIAGNOSIS — S7290XD Unspecified fracture of unspecified femur, subsequent encounter for closed fracture with routine healing: Secondary | ICD-10-CM | POA: Diagnosis not present

## 2018-05-11 DIAGNOSIS — S7290XD Unspecified fracture of unspecified femur, subsequent encounter for closed fracture with routine healing: Secondary | ICD-10-CM | POA: Diagnosis not present

## 2018-05-11 DIAGNOSIS — R262 Difficulty in walking, not elsewhere classified: Secondary | ICD-10-CM | POA: Diagnosis not present

## 2018-05-11 DIAGNOSIS — M6281 Muscle weakness (generalized): Secondary | ICD-10-CM | POA: Diagnosis not present

## 2018-05-11 DIAGNOSIS — J449 Chronic obstructive pulmonary disease, unspecified: Secondary | ICD-10-CM | POA: Diagnosis not present

## 2018-05-11 DIAGNOSIS — J129 Viral pneumonia, unspecified: Secondary | ICD-10-CM | POA: Diagnosis not present

## 2018-05-14 DIAGNOSIS — R262 Difficulty in walking, not elsewhere classified: Secondary | ICD-10-CM | POA: Diagnosis not present

## 2018-05-14 DIAGNOSIS — J129 Viral pneumonia, unspecified: Secondary | ICD-10-CM | POA: Diagnosis not present

## 2018-05-14 DIAGNOSIS — S7290XD Unspecified fracture of unspecified femur, subsequent encounter for closed fracture with routine healing: Secondary | ICD-10-CM | POA: Diagnosis not present

## 2018-05-14 DIAGNOSIS — M6281 Muscle weakness (generalized): Secondary | ICD-10-CM | POA: Diagnosis not present

## 2018-05-14 DIAGNOSIS — J449 Chronic obstructive pulmonary disease, unspecified: Secondary | ICD-10-CM | POA: Diagnosis not present

## 2018-05-15 DIAGNOSIS — R262 Difficulty in walking, not elsewhere classified: Secondary | ICD-10-CM | POA: Diagnosis not present

## 2018-05-15 DIAGNOSIS — J129 Viral pneumonia, unspecified: Secondary | ICD-10-CM | POA: Diagnosis not present

## 2018-05-15 DIAGNOSIS — M6281 Muscle weakness (generalized): Secondary | ICD-10-CM | POA: Diagnosis not present

## 2018-05-15 DIAGNOSIS — J449 Chronic obstructive pulmonary disease, unspecified: Secondary | ICD-10-CM | POA: Diagnosis not present

## 2018-05-15 DIAGNOSIS — S7290XD Unspecified fracture of unspecified femur, subsequent encounter for closed fracture with routine healing: Secondary | ICD-10-CM | POA: Diagnosis not present

## 2018-05-16 DIAGNOSIS — M6281 Muscle weakness (generalized): Secondary | ICD-10-CM | POA: Diagnosis not present

## 2018-05-16 DIAGNOSIS — J129 Viral pneumonia, unspecified: Secondary | ICD-10-CM | POA: Diagnosis not present

## 2018-05-16 DIAGNOSIS — J449 Chronic obstructive pulmonary disease, unspecified: Secondary | ICD-10-CM | POA: Diagnosis not present

## 2018-05-16 DIAGNOSIS — Z9181 History of falling: Secondary | ICD-10-CM | POA: Diagnosis not present

## 2018-05-16 DIAGNOSIS — S7290XD Unspecified fracture of unspecified femur, subsequent encounter for closed fracture with routine healing: Secondary | ICD-10-CM | POA: Diagnosis not present

## 2018-05-16 DIAGNOSIS — R262 Difficulty in walking, not elsewhere classified: Secondary | ICD-10-CM | POA: Diagnosis not present

## 2018-05-17 DIAGNOSIS — S7290XD Unspecified fracture of unspecified femur, subsequent encounter for closed fracture with routine healing: Secondary | ICD-10-CM | POA: Diagnosis not present

## 2018-05-17 DIAGNOSIS — M25559 Pain in unspecified hip: Secondary | ICD-10-CM | POA: Diagnosis not present

## 2018-05-17 DIAGNOSIS — J449 Chronic obstructive pulmonary disease, unspecified: Secondary | ICD-10-CM | POA: Diagnosis not present

## 2018-05-17 DIAGNOSIS — M6281 Muscle weakness (generalized): Secondary | ICD-10-CM | POA: Diagnosis not present

## 2018-05-17 DIAGNOSIS — J129 Viral pneumonia, unspecified: Secondary | ICD-10-CM | POA: Diagnosis not present

## 2018-05-17 DIAGNOSIS — G894 Chronic pain syndrome: Secondary | ICD-10-CM | POA: Diagnosis not present

## 2018-05-17 DIAGNOSIS — R262 Difficulty in walking, not elsewhere classified: Secondary | ICD-10-CM | POA: Diagnosis not present

## 2018-05-18 DIAGNOSIS — J449 Chronic obstructive pulmonary disease, unspecified: Secondary | ICD-10-CM | POA: Diagnosis not present

## 2018-05-18 DIAGNOSIS — M6281 Muscle weakness (generalized): Secondary | ICD-10-CM | POA: Diagnosis not present

## 2018-05-18 DIAGNOSIS — R262 Difficulty in walking, not elsewhere classified: Secondary | ICD-10-CM | POA: Diagnosis not present

## 2018-05-18 DIAGNOSIS — S7290XD Unspecified fracture of unspecified femur, subsequent encounter for closed fracture with routine healing: Secondary | ICD-10-CM | POA: Diagnosis not present

## 2018-05-18 DIAGNOSIS — J129 Viral pneumonia, unspecified: Secondary | ICD-10-CM | POA: Diagnosis not present

## 2018-05-21 DIAGNOSIS — R262 Difficulty in walking, not elsewhere classified: Secondary | ICD-10-CM | POA: Diagnosis not present

## 2018-05-21 DIAGNOSIS — M6281 Muscle weakness (generalized): Secondary | ICD-10-CM | POA: Diagnosis not present

## 2018-05-21 DIAGNOSIS — J449 Chronic obstructive pulmonary disease, unspecified: Secondary | ICD-10-CM | POA: Diagnosis not present

## 2018-05-21 DIAGNOSIS — S7290XD Unspecified fracture of unspecified femur, subsequent encounter for closed fracture with routine healing: Secondary | ICD-10-CM | POA: Diagnosis not present

## 2018-05-21 DIAGNOSIS — J129 Viral pneumonia, unspecified: Secondary | ICD-10-CM | POA: Diagnosis not present

## 2018-05-22 DIAGNOSIS — M6281 Muscle weakness (generalized): Secondary | ICD-10-CM | POA: Diagnosis not present

## 2018-05-22 DIAGNOSIS — S7290XD Unspecified fracture of unspecified femur, subsequent encounter for closed fracture with routine healing: Secondary | ICD-10-CM | POA: Diagnosis not present

## 2018-05-22 DIAGNOSIS — J449 Chronic obstructive pulmonary disease, unspecified: Secondary | ICD-10-CM | POA: Diagnosis not present

## 2018-05-22 DIAGNOSIS — R262 Difficulty in walking, not elsewhere classified: Secondary | ICD-10-CM | POA: Diagnosis not present

## 2018-05-22 DIAGNOSIS — J129 Viral pneumonia, unspecified: Secondary | ICD-10-CM | POA: Diagnosis not present

## 2018-05-22 DIAGNOSIS — E876 Hypokalemia: Secondary | ICD-10-CM | POA: Diagnosis not present

## 2018-05-24 ENCOUNTER — Ambulatory Visit: Payer: Medicare Other | Admitting: Pulmonary Disease

## 2018-05-30 DIAGNOSIS — F5101 Primary insomnia: Secondary | ICD-10-CM | POA: Diagnosis not present

## 2018-05-30 DIAGNOSIS — F330 Major depressive disorder, recurrent, mild: Secondary | ICD-10-CM | POA: Diagnosis not present

## 2018-06-06 DIAGNOSIS — R600 Localized edema: Secondary | ICD-10-CM | POA: Diagnosis not present

## 2018-06-06 DIAGNOSIS — R635 Abnormal weight gain: Secondary | ICD-10-CM | POA: Diagnosis not present

## 2018-06-11 ENCOUNTER — Ambulatory Visit (INDEPENDENT_AMBULATORY_CARE_PROVIDER_SITE_OTHER): Payer: Medicare Other | Admitting: Pulmonary Disease

## 2018-06-11 ENCOUNTER — Ambulatory Visit: Payer: Medicare Other | Admitting: Cardiology

## 2018-06-11 ENCOUNTER — Encounter: Payer: Self-pay | Admitting: Pulmonary Disease

## 2018-06-11 DIAGNOSIS — J9611 Chronic respiratory failure with hypoxia: Secondary | ICD-10-CM | POA: Diagnosis not present

## 2018-06-11 DIAGNOSIS — R918 Other nonspecific abnormal finding of lung field: Secondary | ICD-10-CM

## 2018-06-11 NOTE — Progress Notes (Signed)
   Subjective:    Patient ID: Michelle Levine, female    DOB: December 09, 1921, 83 y.o.   MRN: [id number]  HPI  84 yo never smoker for follow-up of chronic hypoxic respiratory failure She has a history of non small cell cancer, adenocarcinoma dx 42015 Dominant 1.8 cm RUL nodule with small bilateral pulmonary ndoules suspcious for mets. The other subcentimeter nodules and a precarinal lymph node were negative on PET.  Foundation One markers were positive for MET mutation, but negative for EGFR or ALK. She underwent stereotactic radiotherapy to the right upper lobe lung nodule in 04/2014.   She is on furosemide for valvular regurgitation.  She was seen in 04/2018 and portable concentrator was prescribed.  She has not received this yet but granddaughter [name] who accompanies states that DME at this prescription is working on.  Paperwork has been submitted. She has been doing well since last visit.  Oxygen seems to help and improves her exercise tolerance Reviewed her lab work from [facility]. CT angiogram 03/2018 showed bilateral small effusions, mediastinal lymph nodes borderline enlarged with prevascular lymph node increased from 12 to 16 mm with nodular changes of the right upper lobe related to radiation, other nodules are stable.  There was some atelectasis of the left lower lobe    Review of Systems neg for any significant sore throat, dysphagia, itching, sneezing, nasal congestion or excess/ purulent secretions, fever, chills, sweats, unintended wt loss, pleuritic or exertional cp, hempoptysis, orthopnea pnd or change in chronic leg swelling. Also denies presyncope, palpitations, heartburn, abdominal pain, nausea, vomiting, diarrhea or change in bowel or urinary habits, dysuria,hematuria, rash, arthralgias, visual complaints, headache, numbness weakness or ataxia.     Objective:   Physical Exam   Gen. Pleasant,elderly, well-nourished, in no distress ENT - no thrush, no post nasal  drip Neck: No JVD, no thyromegaly, no carotid bruits Lungs: no use of accessory muscles, no dullness to percussion, decreased BL  without rales or rhonchi  Cardiovascular: Rhythm regular, heart sounds  normal, no murmurs or gallops, 2+ peripheral edema Musculoskeletal: No deformities, no cyanosis or clubbing         Assessment & Plan:

## 2018-06-11 NOTE — Patient Instructions (Signed)
Call us as needed You do have some fluid around both lungs, stay on water pill/ lasix

## 2018-06-11 NOTE — Assessment & Plan Note (Signed)
Prescription for portable concentrator has been sent Oxygen has helped improve her daytime exercise tolerance

## 2018-06-11 NOTE — Assessment & Plan Note (Signed)
Prevascular lymph node is slightly enlarged and needs continued surveillance. She is certainly not a candidate for aggressive treatment should there be recurrence of cancer

## 2018-06-12 DIAGNOSIS — M818 Other osteoporosis without current pathological fracture: Secondary | ICD-10-CM | POA: Diagnosis not present

## 2018-06-12 DIAGNOSIS — R52 Pain, unspecified: Secondary | ICD-10-CM | POA: Diagnosis not present

## 2018-06-12 DIAGNOSIS — E0590 Thyrotoxicosis, unspecified without thyrotoxic crisis or storm: Secondary | ICD-10-CM | POA: Diagnosis not present

## 2018-06-12 DIAGNOSIS — K5900 Constipation, unspecified: Secondary | ICD-10-CM | POA: Diagnosis not present

## 2018-06-12 DIAGNOSIS — D509 Iron deficiency anemia, unspecified: Secondary | ICD-10-CM | POA: Diagnosis not present

## 2018-06-12 DIAGNOSIS — M8000XA Age-related osteoporosis with current pathological fracture, unspecified site, initial encounter for fracture: Secondary | ICD-10-CM | POA: Diagnosis not present

## 2018-06-12 DIAGNOSIS — I509 Heart failure, unspecified: Secondary | ICD-10-CM | POA: Diagnosis not present

## 2018-06-12 DIAGNOSIS — M6281 Muscle weakness (generalized): Secondary | ICD-10-CM | POA: Diagnosis not present

## 2018-06-12 DIAGNOSIS — J449 Chronic obstructive pulmonary disease, unspecified: Secondary | ICD-10-CM | POA: Diagnosis not present

## 2018-06-12 DIAGNOSIS — N189 Chronic kidney disease, unspecified: Secondary | ICD-10-CM | POA: Diagnosis not present

## 2018-06-12 DIAGNOSIS — J309 Allergic rhinitis, unspecified: Secondary | ICD-10-CM | POA: Diagnosis not present

## 2018-06-12 DIAGNOSIS — I10 Essential (primary) hypertension: Secondary | ICD-10-CM | POA: Diagnosis not present

## 2018-06-13 DIAGNOSIS — D509 Iron deficiency anemia, unspecified: Secondary | ICD-10-CM | POA: Diagnosis not present

## 2018-06-13 DIAGNOSIS — R52 Pain, unspecified: Secondary | ICD-10-CM | POA: Diagnosis not present

## 2018-06-13 DIAGNOSIS — G3184 Mild cognitive impairment, so stated: Secondary | ICD-10-CM | POA: Diagnosis not present

## 2018-06-13 DIAGNOSIS — E0590 Thyrotoxicosis, unspecified without thyrotoxic crisis or storm: Secondary | ICD-10-CM | POA: Diagnosis not present

## 2018-06-13 DIAGNOSIS — K5900 Constipation, unspecified: Secondary | ICD-10-CM | POA: Diagnosis not present

## 2018-06-13 DIAGNOSIS — F5101 Primary insomnia: Secondary | ICD-10-CM | POA: Diagnosis not present

## 2018-06-13 DIAGNOSIS — I10 Essential (primary) hypertension: Secondary | ICD-10-CM | POA: Diagnosis not present

## 2018-06-13 DIAGNOSIS — J309 Allergic rhinitis, unspecified: Secondary | ICD-10-CM | POA: Diagnosis not present

## 2018-06-13 DIAGNOSIS — M6281 Muscle weakness (generalized): Secondary | ICD-10-CM | POA: Diagnosis not present

## 2018-06-13 DIAGNOSIS — J449 Chronic obstructive pulmonary disease, unspecified: Secondary | ICD-10-CM | POA: Diagnosis not present

## 2018-06-13 DIAGNOSIS — N189 Chronic kidney disease, unspecified: Secondary | ICD-10-CM | POA: Diagnosis not present

## 2018-06-13 DIAGNOSIS — M8000XA Age-related osteoporosis with current pathological fracture, unspecified site, initial encounter for fracture: Secondary | ICD-10-CM | POA: Diagnosis not present

## 2018-06-13 DIAGNOSIS — F330 Major depressive disorder, recurrent, mild: Secondary | ICD-10-CM | POA: Diagnosis not present

## 2018-06-13 DIAGNOSIS — I509 Heart failure, unspecified: Secondary | ICD-10-CM | POA: Diagnosis not present

## 2018-06-13 DIAGNOSIS — M818 Other osteoporosis without current pathological fracture: Secondary | ICD-10-CM | POA: Diagnosis not present

## 2018-06-14 DIAGNOSIS — R609 Edema, unspecified: Secondary | ICD-10-CM | POA: Diagnosis not present

## 2018-06-14 DIAGNOSIS — D649 Anemia, unspecified: Secondary | ICD-10-CM | POA: Diagnosis not present

## 2018-06-15 NOTE — Progress Notes (Signed)
HPI: FU aortic insufficiency, mitral insufficiency, and tricuspid regurgitation with moderate pulmonary hypertension. Last echocardiogram in September 2014 showed an ejection fraction of 50-55%, moderate aortic insufficiency, bileaflet mitral valve prolapse with moderate mitral regurgitation, moderate tricuspid regurgitation and severe left atrial enlargement. Patient has been diagnosed with lung cancer. At previous ov, we elected not to pursue fu echo as she is clear she would never consider valve surgery.   Patient seen in June 2019 and noted to be in atrial fibrillation.  Her bisoprolol dose was increased.  She was started on apixaban.  Since I last saw her,  patient denies dyspnea, chest pain, palpitations or syncope.  She does have pedal edema.  Current Outpatient Medications  Medication Sig Dispense Refill  . acetaminophen (TYLENOL) 325 MG tablet Take 1-2 tablets (325-650 mg total) by mouth every 6 (six) hours as needed for mild pain (pain score 1-3 or temp > 100.5).    Marland Kitchen amLODipine (NORVASC) 5 MG tablet Take 5 mg by mouth daily.    Marland Kitchen apixaban (ELIQUIS) 2.5 MG TABS tablet Take 1 tablet (2.5 mg total) by mouth 2 (two) times daily. 60 tablet 5  . bisoprolol (ZEBETA) 10 MG tablet Take 0.5 tablets (5 mg total) by mouth daily. 30 tablet 5  . Calcium Carbonate-Vitamin D (CALCIUM + D PO) Take 1 capsule by mouth daily.     . citalopram (CELEXA) 20 MG tablet Take 20 mg by mouth daily.     Marland Kitchen docusate sodium (COLACE) 100 MG capsule Take 1 capsule (100 mg total) by mouth 2 (two) times daily. 10 capsule 0  . ferrous sulfate 325 (65 FE) MG tablet Take 1 tablet (325 mg total) by mouth 3 (three) times daily after meals.  3  . fluticasone (FLONASE) 50 MCG/ACT nasal spray Place 1 spray into both nostrils daily as needed for allergies.     . furosemide (LASIX) 40 MG tablet Take 1 tablet (40 mg total) by mouth daily. 30 tablet   . galantamine (RAZADYNE) 8 MG tablet TAKE 1 TABLET BY MOUTH EACH MORNING WITH  FOOD  11  . HYDROcodone-acetaminophen (NORCO/VICODIN) 5-325 MG tablet Take 1-2 tablets by mouth every 4 (four) hours as needed for moderate pain or severe pain. 60 tablet 0  . ipratropium-albuterol (DUONEB) 0.5-2.5 (3) MG/3ML SOLN Take 3 mLs by nebulization 3 (three) times daily. 360 mL 3  . KLOR-CON 10 10 MEQ tablet Take 10 mEq by mouth daily.  3  . MELATONIN PO Take by mouth.    . methimazole (TAPAZOLE) 10 MG tablet Take 5 mg by mouth daily.     . Multiple Vitamin (MULITIVITAMIN WITH MINERALS) TABS Take 1 tablet by mouth daily.     . zoledronic acid (RECLAST) 5 MG/100ML SOLN Inject 5 mg into the vein. Reported on 11/30/2015     No current facility-administered medications for this visit.      Past Medical History:  Diagnosis Date  . Aortic insufficiency   . CAD (coronary artery disease)   . Cancer ([place])   . Cataracts, bilateral   . CHF (congestive heart failure) ([place])   . COPD (chronic obstructive pulmonary disease) ([place])   . Hypertension   . Mitral insufficiency   . Pulmonary hypertension ([place])   . Thyroid disease   . Tricuspid regurgitation     Past Surgical History:  Procedure Laterality Date  . ABDOMINAL HYSTERECTOMY    . APPENDECTOMY    . BREAST LUMPECTOMY    . CATARACT EXTRACTION    .  EYE SURGERY    . FEMUR IM NAIL Left 01/30/2018   Procedure: INTRAMEDULLARY (IM) NAIL FEMORAL LEFT;  Surgeon: Paralee Cancel, MD;  Location: [facility];  Service: Orthopedics;  Laterality: Left;    Social History   Socioeconomic History  . Marital status: Married    Spouse name: Not on file  . Number of children: Not on file  . Years of education: Not on file  . Highest education level: Not on file  Occupational History  . Occupation: retired  Scientific laboratory technician  . Financial resource strain: Not on file  . Food insecurity:    Worry: Not on file    Inability: Not on file  . Transportation needs:    Medical: Not on file    Non-medical: Not on file  Tobacco Use  . Smoking status: Never  Smoker  . Smokeless tobacco: Never Used  Substance and Sexual Activity  . Alcohol use: No  . Drug use: No  . Sexual activity: Not on file  Lifestyle  . Physical activity:    Days per week: Not on file    Minutes per session: Not on file  . Stress: Not on file  Relationships  . Social connections:    Talks on phone: Not on file    Gets together: Not on file    Attends religious service: Not on file    Active member of club or organization: Not on file    Attends meetings of clubs or organizations: Not on file    Relationship status: Not on file  . Intimate partner violence:    Fear of current or ex partner: Not on file    Emotionally abused: Not on file    Physically abused: Not on file    Forced sexual activity: Not on file  Other Topics Concern  . Not on file  Social History Narrative  . Not on file    Family History  Problem Relation Age of Onset  . Heart attack Mother 72  . Pneumonia Father 51  . Heart attack Brother   . Leukemia Brother   . Heart attack Brother   . Leukemia Brother     ROS: Arthritis and hip pain but no fevers or chills, productive cough, hemoptysis, dysphasia, odynophagia, melena, hematochezia, dysuria, hematuria, rash, seizure activity, orthopnea, PND, claudication. Remaining systems are negative.  Physical Exam: Well-developed frail in no acute distress.  Skin is warm and dry.  HEENT is normal.  Neck is supple.  Chest is clear to auscultation with normal expansion.  Cardiovascular exam is irregular; 3/6 systolic murmur apex Abdominal exam nontender or distended. No masses palpated. Extremities show 2-3+ edema. neuro grossly intact  ECG-atrial fibrillation at a rate of 73.  Cannot rule out prior septal infarct.  Nonspecific ST changes.  Personally reviewed  A/P  1 aortic insufficiency-patient has significant valvular disease.  However she has been clear she would never consider valve surgery and I think this is appropriate given her age  and overall medical condition.  We will continue to treat conservatively and not pursue follow-up echocardiograms.    2 persistent atrial fibrillation-patient remains in atrial fibrillation but her rate is controlled.  Continue present dose of beta-blocker.  Continue apixaban.  3 acute on chronic diastolic congestive heart failure-she is volume overloaded today.  She has significant pedal edema.  Plan increase Lasix to 80 mg daily.  Check potassium and renal function in 1 week.  I will have her see 1 of our physicians assistants  in 4 to 6 weeks to make sure that she is improving.  We discussed the importance of fluid restriction and low-sodium diet.   Kirk Ruths, MD

## 2018-06-19 ENCOUNTER — Ambulatory Visit (INDEPENDENT_AMBULATORY_CARE_PROVIDER_SITE_OTHER): Payer: Medicare Other | Admitting: Cardiology

## 2018-06-19 ENCOUNTER — Encounter: Payer: Self-pay | Admitting: Cardiology

## 2018-06-19 VITALS — BP 118/76 | HR 73 | Ht 65.0 in | Wt 132.0 lb

## 2018-06-19 DIAGNOSIS — S7290XD Unspecified fracture of unspecified femur, subsequent encounter for closed fracture with routine healing: Secondary | ICD-10-CM | POA: Diagnosis not present

## 2018-06-19 DIAGNOSIS — I5033 Acute on chronic diastolic (congestive) heart failure: Secondary | ICD-10-CM | POA: Diagnosis not present

## 2018-06-19 DIAGNOSIS — I4891 Unspecified atrial fibrillation: Secondary | ICD-10-CM

## 2018-06-19 DIAGNOSIS — R601 Generalized edema: Secondary | ICD-10-CM | POA: Diagnosis not present

## 2018-06-19 DIAGNOSIS — I351 Nonrheumatic aortic (valve) insufficiency: Secondary | ICD-10-CM | POA: Diagnosis not present

## 2018-06-19 DIAGNOSIS — M25559 Pain in unspecified hip: Secondary | ICD-10-CM | POA: Diagnosis not present

## 2018-06-19 DIAGNOSIS — G894 Chronic pain syndrome: Secondary | ICD-10-CM | POA: Diagnosis not present

## 2018-06-19 MED ORDER — FUROSEMIDE 40 MG PO TABS: 80.0000 mg | ORAL_TABLET | Freq: Every day | ORAL | 3 refills | Status: DC

## 2018-06-19 NOTE — Patient Instructions (Signed)
Medication Instructions:   INCREASE FUROSEMIDE TO 80 MG ONCE DAILY= 2 OF THE 40 MG TABLETS ONCE DAILY  Labwork:  Your physician recommends that you return for lab work in: [place]:  Your physician recommends that you schedule a follow-up appointment in: 4-6 [place] physician recommends that you schedule a follow-up appointment in: [place]

## 2018-06-26 ENCOUNTER — Telehealth: Payer: Self-pay | Admitting: Pulmonary Disease

## 2018-06-26 ENCOUNTER — Encounter: Payer: Self-pay | Admitting: Cardiology

## 2018-06-26 DIAGNOSIS — I509 Heart failure, unspecified: Secondary | ICD-10-CM | POA: Diagnosis not present

## 2018-06-26 DIAGNOSIS — D649 Anemia, unspecified: Secondary | ICD-10-CM | POA: Diagnosis not present

## 2018-06-26 NOTE — Telephone Encounter (Signed)
Spoke with pt's son, Antony Haste. He states that he recently bought a POC for the pt. While using the POC the pt's oxygen level is falling into the 80's. When this happens the put the pt back on her "regular" oxygen and her level comes right back up. Derek Mound that it sounds like the pt can't tolerate pulsed oxygen. Antony Haste is going to work with the pt and try to turn up the POC to see if she can tolerate it. Derek Mound to let us know if there is anything we can do to help. Nothing further was needed.

## 2018-06-26 NOTE — Telephone Encounter (Signed)
Pt's son is returning call. Cb is [phone]

## 2018-06-26 NOTE — Telephone Encounter (Signed)
Attempted to call pt's son, Antony Haste. I did not receive an answer. I have left a message for Antony Haste to return our call.

## 2018-07-03 ENCOUNTER — Inpatient Hospital Stay (HOSPITAL_COMMUNITY)
Admission: EM | Admit: 2018-07-03 | Discharge: 2018-07-09 | DRG: 291 | Disposition: A | Discharge status: Skilled Nursing Facility | Payer: Medicare Other | Source: Skilled Nursing Facility | Attending: Internal Medicine | Admitting: Internal Medicine

## 2018-07-03 ENCOUNTER — Inpatient Hospital Stay (HOSPITAL_COMMUNITY): Payer: Medicare Other

## 2018-07-03 ENCOUNTER — Emergency Department (HOSPITAL_COMMUNITY): Payer: Medicare Other

## 2018-07-03 ENCOUNTER — Telehealth: Payer: Self-pay | Admitting: Cardiology

## 2018-07-03 ENCOUNTER — Other Ambulatory Visit: Payer: Self-pay

## 2018-07-03 ENCOUNTER — Encounter (HOSPITAL_COMMUNITY): Payer: Self-pay | Admitting: Internal Medicine

## 2018-07-03 DIAGNOSIS — I5043 Acute on chronic combined systolic (congestive) and diastolic (congestive) heart failure: Secondary | ICD-10-CM | POA: Diagnosis present

## 2018-07-03 DIAGNOSIS — Z66 Do not resuscitate: Secondary | ICD-10-CM | POA: Diagnosis present

## 2018-07-03 DIAGNOSIS — M7989 Other specified soft tissue disorders: Secondary | ICD-10-CM | POA: Diagnosis not present

## 2018-07-03 DIAGNOSIS — I2510 Atherosclerotic heart disease of native coronary artery without angina pectoris: Secondary | ICD-10-CM | POA: Diagnosis present

## 2018-07-03 DIAGNOSIS — I481 Persistent atrial fibrillation: Secondary | ICD-10-CM | POA: Diagnosis not present

## 2018-07-03 DIAGNOSIS — C3491 Malignant neoplasm of unspecified part of right bronchus or lung: Secondary | ICD-10-CM | POA: Diagnosis not present

## 2018-07-03 DIAGNOSIS — E876 Hypokalemia: Secondary | ICD-10-CM | POA: Diagnosis present

## 2018-07-03 DIAGNOSIS — J90 Pleural effusion, not elsewhere classified: Secondary | ICD-10-CM | POA: Diagnosis not present

## 2018-07-03 DIAGNOSIS — Z806 Family history of leukemia: Secondary | ICD-10-CM | POA: Diagnosis not present

## 2018-07-03 DIAGNOSIS — Z7951 Long term (current) use of inhaled steroids: Secondary | ICD-10-CM | POA: Diagnosis not present

## 2018-07-03 DIAGNOSIS — Z7901 Long term (current) use of anticoagulants: Secondary | ICD-10-CM

## 2018-07-03 DIAGNOSIS — R0600 Dyspnea, unspecified: Secondary | ICD-10-CM | POA: Diagnosis not present

## 2018-07-03 DIAGNOSIS — M79609 Pain in unspecified limb: Secondary | ICD-10-CM | POA: Diagnosis not present

## 2018-07-03 DIAGNOSIS — J449 Chronic obstructive pulmonary disease, unspecified: Secondary | ICD-10-CM | POA: Diagnosis present

## 2018-07-03 DIAGNOSIS — E43 Unspecified severe protein-calorie malnutrition: Secondary | ICD-10-CM | POA: Diagnosis present

## 2018-07-03 DIAGNOSIS — I340 Nonrheumatic mitral (valve) insufficiency: Secondary | ICD-10-CM | POA: Diagnosis not present

## 2018-07-03 DIAGNOSIS — I5023 Acute on chronic systolic (congestive) heart failure: Secondary | ICD-10-CM | POA: Diagnosis not present

## 2018-07-03 DIAGNOSIS — I272 Pulmonary hypertension, unspecified: Secondary | ICD-10-CM | POA: Diagnosis present

## 2018-07-03 DIAGNOSIS — I5033 Acute on chronic diastolic (congestive) heart failure: Secondary | ICD-10-CM

## 2018-07-03 DIAGNOSIS — I493 Ventricular premature depolarization: Secondary | ICD-10-CM | POA: Diagnosis present

## 2018-07-03 DIAGNOSIS — I110 Hypertensive heart disease with heart failure: Principal | ICD-10-CM | POA: Diagnosis present

## 2018-07-03 DIAGNOSIS — R600 Localized edema: Secondary | ICD-10-CM | POA: Diagnosis present

## 2018-07-03 DIAGNOSIS — Z8249 Family history of ischemic heart disease and other diseases of the circulatory system: Secondary | ICD-10-CM | POA: Diagnosis not present

## 2018-07-03 DIAGNOSIS — I351 Nonrheumatic aortic (valve) insufficiency: Secondary | ICD-10-CM | POA: Diagnosis not present

## 2018-07-03 DIAGNOSIS — I10 Essential (primary) hypertension: Secondary | ICD-10-CM | POA: Diagnosis present

## 2018-07-03 DIAGNOSIS — Z9849 Cataract extraction status, unspecified eye: Secondary | ICD-10-CM

## 2018-07-03 DIAGNOSIS — R54 Age-related physical debility: Secondary | ICD-10-CM | POA: Diagnosis present

## 2018-07-03 DIAGNOSIS — Z681 Body mass index (BMI) 19 or less, adult: Secondary | ICD-10-CM

## 2018-07-03 DIAGNOSIS — I083 Combined rheumatic disorders of mitral, aortic and tricuspid valves: Secondary | ICD-10-CM | POA: Diagnosis present

## 2018-07-03 DIAGNOSIS — C799 Secondary malignant neoplasm of unspecified site: Secondary | ICD-10-CM | POA: Diagnosis present

## 2018-07-03 DIAGNOSIS — E039 Hypothyroidism, unspecified: Secondary | ICD-10-CM | POA: Diagnosis present

## 2018-07-03 DIAGNOSIS — D649 Anemia, unspecified: Secondary | ICD-10-CM | POA: Diagnosis not present

## 2018-07-03 DIAGNOSIS — R0603 Acute respiratory distress: Secondary | ICD-10-CM | POA: Insufficient documentation

## 2018-07-03 DIAGNOSIS — I4891 Unspecified atrial fibrillation: Secondary | ICD-10-CM | POA: Diagnosis present

## 2018-07-03 DIAGNOSIS — Z90710 Acquired absence of both cervix and uterus: Secondary | ICD-10-CM

## 2018-07-03 DIAGNOSIS — I445 Left posterior fascicular block: Secondary | ICD-10-CM | POA: Diagnosis present

## 2018-07-03 DIAGNOSIS — E0590 Thyrotoxicosis, unspecified without thyrotoxic crisis or storm: Secondary | ICD-10-CM | POA: Diagnosis present

## 2018-07-03 DIAGNOSIS — I4819 Other persistent atrial fibrillation: Secondary | ICD-10-CM

## 2018-07-03 DIAGNOSIS — J9611 Chronic respiratory failure with hypoxia: Secondary | ICD-10-CM | POA: Diagnosis present

## 2018-07-03 DIAGNOSIS — T502X5A Adverse effect of carbonic-anhydrase inhibitors, benzothiadiazides and other diuretics, initial encounter: Secondary | ICD-10-CM | POA: Diagnosis present

## 2018-07-03 HISTORY — DX: Unspecified atrial fibrillation: I48.91

## 2018-07-03 LAB — CBC
HCT: 39.1 % (ref 36.0–46.0)
Hemoglobin: 11.9 g/dL — ABNORMAL LOW (ref 12.0–15.0)
MCH: 30.1 pg (ref 26.0–34.0)
MCHC: 30.4 g/dL (ref 30.0–36.0)
MCV: 98.7 fL (ref 78.0–100.0)
PLATELETS: 160 10*3/uL (ref 150–400)
RBC: 3.96 MIL/uL (ref 3.87–5.11)
RDW: 15.9 % — AB (ref 11.5–15.5)
WBC: 7.5 10*3/uL (ref 4.0–10.5)

## 2018-07-03 LAB — BASIC METABOLIC PANEL
Anion gap: 8 (ref 5–15)
BUN: 17 mg/dL (ref 8–23)
CHLORIDE: 100 mmol/L (ref 98–111)
CO2: 33 mmol/L — ABNORMAL HIGH (ref 22–32)
CREATININE: 0.82 mg/dL (ref 0.44–1.00)
Calcium: 9 mg/dL (ref 8.9–10.3)
GFR calc Af Amer: >60 mL/min (ref >60)
GFR, EST NON AFRICAN AMERICAN: 60 mL/min — AB (ref >60)
Glucose, Bld: 107 mg/dL — ABNORMAL HIGH (ref 70–99)
Potassium: 3.5 mmol/L (ref 3.5–5.1)
SODIUM: 141 mmol/L (ref 135–145)

## 2018-07-03 LAB — BRAIN NATRIURETIC PEPTIDE: B Natriuretic Peptide: 799.7 pg/mL — ABNORMAL HIGH (ref 0.0–100.0)

## 2018-07-03 LAB — MRSA PCR SCREENING: MRSA by PCR: POSITIVE — AB

## 2018-07-03 MED ORDER — IPRATROPIUM-ALBUTEROL 0.5-2.5 (3) MG/3ML IN SOLN
3.0000 mL | Freq: Three times a day (TID) | RESPIRATORY_TRACT | Status: DC
  Administered 2018-07-04 – 2018-07-09 (×15): 3 mL via RESPIRATORY_TRACT
  Filled 2018-07-03 (×16): qty 3

## 2018-07-03 MED ORDER — ACETAMINOPHEN 325 MG PO TABS: 650.0000 mg | ORAL_TABLET | ORAL | Status: DC | PRN

## 2018-07-03 MED ORDER — POTASSIUM CHLORIDE CRYS ER 20 MEQ PO TBCR
20.0000 meq | EXTENDED_RELEASE_TABLET | Freq: Once | ORAL | Status: AC
  Administered 2018-07-03: 20 meq via ORAL
  Filled 2018-07-03: qty 1

## 2018-07-03 MED ORDER — SODIUM CHLORIDE 0.9% FLUSH
3.0000 mL | INTRAVENOUS | Status: DC | PRN
  Administered 2018-07-04: 3 mL via INTRAVENOUS
  Filled 2018-07-03: qty 3

## 2018-07-03 MED ORDER — SODIUM CHLORIDE 0.9 % IV SOLN: 250.0000 mL | INTRAVENOUS | Status: DC | PRN

## 2018-07-03 MED ORDER — FUROSEMIDE 10 MG/ML IJ SOLN
40.0000 mg | Freq: Once | INTRAMUSCULAR | Status: AC
  Administered 2018-07-03: 40 mg via INTRAVENOUS
  Filled 2018-07-03: qty 4

## 2018-07-03 MED ORDER — BISOPROLOL FUMARATE 5 MG PO TABS
10.0000 mg | ORAL_TABLET | Freq: Every day | ORAL | Status: DC
  Administered 2018-07-03 – 2018-07-09 (×7): 10 mg via ORAL
  Filled 2018-07-03 (×7): qty 2

## 2018-07-03 MED ORDER — IPRATROPIUM-ALBUTEROL 0.5-2.5 (3) MG/3ML IN SOLN
3.0000 mL | Freq: Four times a day (QID) | RESPIRATORY_TRACT | Status: DC
  Administered 2018-07-03 (×2): 3 mL via RESPIRATORY_TRACT
  Filled 2018-07-03 (×2): qty 3

## 2018-07-03 MED ORDER — ONDANSETRON HCL 4 MG/2ML IJ SOLN: 4.0000 mg | Freq: Four times a day (QID) | INTRAMUSCULAR | Status: DC | PRN

## 2018-07-03 MED ORDER — SODIUM CHLORIDE 0.9% FLUSH
3.0000 mL | Freq: Two times a day (BID) | INTRAVENOUS | Status: DC
  Administered 2018-07-03 – 2018-07-08 (×12): 3 mL via INTRAVENOUS

## 2018-07-03 MED ORDER — FUROSEMIDE 10 MG/ML IJ SOLN
60.0000 mg | Freq: Two times a day (BID) | INTRAMUSCULAR | Status: DC
  Administered 2018-07-03 – 2018-07-05 (×4): 60 mg via INTRAVENOUS
  Filled 2018-07-03 (×4): qty 6

## 2018-07-03 MED ORDER — APIXABAN 2.5 MG PO TABS
2.5000 mg | ORAL_TABLET | Freq: Two times a day (BID) | ORAL | Status: DC
  Administered 2018-07-03 – 2018-07-09 (×12): 2.5 mg via ORAL
  Filled 2018-07-03 (×13): qty 1

## 2018-07-03 NOTE — ED Triage Notes (Signed)
Pt arrives from a skilled nursing facility with her daughter for edema and weeping of both lower legs. Pt denies any pain, or sob.

## 2018-07-03 NOTE — Telephone Encounter (Signed)
Received call from granddaughter-states patient saw Dr. Stanford Breed 8/13 and had LE edema, furosemide was increased to 80 mg daily.   She states patient is experiencing increased LE edema, patients LE are "leaking" and the bed sheets were wet this morning.  She states patient lives at [facility] and her activity level has really declined in the last couple of months.  Patient sits in her recliner all day, not active.    She states patient is always SOB, on home O2 at 2L, hx COPD.    Does not weight daily, denies increase in salt intake and facility is monitoring fluid intake.     Reviewed with DOD, Dr. Harrell Gave, advised ER to be evaluated.   Returned call to grand daughter-aware and agreed with recommendations.  Will have patient taken to ER at [facility].    Trish made aware

## 2018-07-03 NOTE — ED Notes (Signed)
ED Provider at bedside. 

## 2018-07-03 NOTE — Telephone Encounter (Signed)
New Message:     Pt c/o swelling: STAT is pt has developed SOB within 24 hours  1) How much weight have you gained and in what time span?   2) If swelling, where is the swelling located? legs  3) Are you currently taking a fluid pill? Yes  4) Are you currently SOB?   5) Do you have a log of your daily weights (if so, list)?   6) Have you gained 3 pounds in a day or 5 pounds in a week?   7) Have you traveled recently?    Pt's grand-daughter states that the pt's legs are so swelling that they are leaking fluid. Pt woke up this morning and her sheets was wet. Pt's grand-daughter states Dr. Increased her lasix in the last couple of weeks.

## 2018-07-03 NOTE — ED Notes (Signed)
Lab called to add on BNP 

## 2018-07-03 NOTE — ED Provider Notes (Signed)
[facility] EMERGENCY DEPARTMENT Provider Note   CSN: [id number] Arrival date & time: 07/03/18  1006     History   Chief Complaint Chief Complaint  Patient presents with  . Leg Swelling    HPI Michelle Levine is a 84 y.o. female.  HPI  84 year old female presents with leg swelling and weeping in the left lower extremity.  She is had leg swelling for several months since having a hip fracture earlier in the year.  Swelling seems to be progressively worsening.  Saw her cardiologist a couple weeks ago who increased her Lasix.  Last night, the patient noticed some clear discharge out of her left lower extremity.  Thus they presented today for evaluation.  No shortness of breath but has had a cough over the last few days and her husband recently had pneumonia.  There is no chest pain.  There is no pain to the lower extremities or color change.  Past Medical History:  Diagnosis Date  . Aortic insufficiency   . CAD (coronary artery disease)   . Cancer ([place])   . Cataracts, bilateral   . CHF (congestive heart failure) ([place])   . COPD (chronic obstructive pulmonary disease) ([place])   . Hypertension   . Mitral insufficiency   . Pulmonary hypertension ([place])   . Thyroid disease   . Tricuspid regurgitation     Patient Active Problem List   Diagnosis Date Noted  . Chronic respiratory failure with hypoxia ([place]) 04/30/2018  . Thrombocytopenia ([place]) 01/31/2018  . Leukocytosis 01/31/2018  . Acute blood loss as cause of postoperative anemia 01/31/2018  . Hyponatremia 01/31/2018  . S/p left hip fracture 01/29/2018  . Sternal fracture 10/07/2016  . Acute asthmatic bronchitis 09/05/2016  . Radiation pneumonitis ([place]) 10/13/2015  . Chronic cough 07/03/2014  . Bronchogenic cancer of right lung ([place]) 03/20/2014  . Multiple lung nodules 11/18/2012  . Aortic insufficiency   . Mitral insufficiency   . Mild tricuspid regurgitation   . Pulmonary hypertension ([place])   . CHF  (congestive heart failure) ([place])   . COPD mixed type ([place])   . Essential hypertension     Past Surgical History:  Procedure Laterality Date  . ABDOMINAL HYSTERECTOMY    . APPENDECTOMY    . BREAST LUMPECTOMY    . CATARACT EXTRACTION    . EYE SURGERY    . FEMUR IM NAIL Left 01/30/2018   Procedure: INTRAMEDULLARY (IM) NAIL FEMORAL LEFT;  Surgeon: Paralee Cancel, MD;  Location: [facility];  Service: Orthopedics;  Laterality: Left;     OB History   None      Home Medications    Prior to Admission medications   Medication Sig Start Date End Date Taking? Authorizing Provider  acetaminophen (TYLENOL) 325 MG tablet Take 1-2 tablets (325-650 mg total) by mouth every 6 (six) hours as needed for mild pain (pain score 1-3 or temp > 100.5). 02/01/18   Raiford Noble Latif, DO  amLODipine (NORVASC) 5 MG tablet Take 5 mg by mouth daily. 12/06/13   Lelon Perla, MD  apixaban (ELIQUIS) 2.5 MG TABS tablet Take 1 tablet (2.5 mg total) by mouth 2 (two) times daily. 04/12/18   Lelon Perla, MD  bisoprolol (ZEBETA) 10 MG tablet Take 0.5 tablets (5 mg total) by mouth daily. 04/12/18   Lelon Perla, MD  Calcium Carbonate-Vitamin D (CALCIUM + D PO) Take 1 capsule by mouth daily.     [provider]  citalopram (CELEXA) 20 MG  tablet Take 20 mg by mouth daily.     [provider]  docusate sodium (COLACE) 100 MG capsule Take 1 capsule (100 mg total) by mouth 2 (two) times daily. 01/31/18   Danae Orleans, PA-C  ferrous sulfate 325 (65 FE) MG tablet Take 1 tablet (325 mg total) by mouth 3 (three) times daily after meals. 01/31/18   Danae Orleans, PA-C  fluticasone (FLONASE) 50 MCG/ACT nasal spray Place 1 spray into both nostrils daily as needed for allergies.  12/01/16   [provider]  furosemide (LASIX) 40 MG tablet Take 2 tablets (80 mg total) by mouth daily. 06/19/18   Lelon Perla, MD  galantamine (RAZADYNE) 8 MG tablet TAKE 1 TABLET BY MOUTH EACH MORNING WITH FOOD  12/31/17   [provider]  HYDROcodone-acetaminophen (NORCO/VICODIN) 5-325 MG tablet Take 1-2 tablets by mouth every 4 (four) hours as needed for moderate pain or severe pain. 01/31/18   Danae Orleans, PA-C  ipratropium-albuterol (DUONEB) 0.5-2.5 (3) MG/3ML SOLN Take 3 mLs by nebulization 3 (three) times daily. 04/30/18   Lauraine Rinne, NP  KLOR-CON 10 10 MEQ tablet Take 10 mEq by mouth daily. 12/21/17   [provider]  MELATONIN PO Take by mouth.    [provider]  methimazole (TAPAZOLE) 10 MG tablet Take 5 mg by mouth daily.     [provider]  Multiple Vitamin (MULITIVITAMIN WITH MINERALS) TABS Take 1 tablet by mouth daily.     [provider]  zoledronic acid (RECLAST) 5 MG/100ML SOLN Inject 5 mg into the vein. Reported on 11/30/2015    [provider]    Family History Family History  Problem Relation Age of Onset  . Heart attack Mother 93  . Pneumonia Father 38  . Heart attack Brother   . Leukemia Brother   . Heart attack Brother   . Leukemia Brother     Social History Social History   Tobacco Use  . Smoking status: Never Smoker  . Smokeless tobacco: Never Used  Substance Use Topics  . Alcohol use: No  . Drug use: No     Allergies   Patient has no known allergies.   Review of Systems Review of Systems  Constitutional: Negative for fever.  Respiratory: Positive for cough. Negative for shortness of breath.   Cardiovascular: Positive for leg swelling. Negative for chest pain.  Skin: Negative for color change and wound.  All other systems reviewed and are negative.    Physical Exam Updated Vital Signs BP 132/78   Pulse 84   Temp 98.7 F (37.1 C) (Oral)   Resp (!) 24   SpO2 92%   Physical Exam  Constitutional: She is oriented to person, place, and time. She appears well-developed and well-nourished. No distress.  HENT:  Head: Normocephalic and atraumatic.  Right Ear: External ear normal.  Left Ear:  External ear normal.  Nose: Nose normal.  Eyes: Right eye exhibits no discharge. Left eye exhibits no discharge.  Cardiovascular: Normal rate, regular rhythm and normal heart sounds.  Pulmonary/Chest: Effort normal. Tachypnea noted. No respiratory distress. She has wheezes (mild, expiratory).  Abdominal: Soft. She exhibits no distension. There is no tenderness.  Musculoskeletal: She exhibits edema.  Compression stockings on bilaterally. BLE with pitting edema. Mild clear fluid surrounding LLE, no redness or increased warmth. No wounds  Neurological: She is alert and oriented to person, place, and time.  Skin: Skin is warm and dry. She is not diaphoretic.  Nursing note and  vitals reviewed.    ED Treatments / Results  Labs (all labs ordered are listed, but only abnormal results are displayed) Labs Reviewed  BASIC METABOLIC PANEL - Abnormal; Notable for the following components:      Result Value   CO2 33 (*)    Glucose, Bld 107 (*)    GFR calc non Af Amer 60 (*)    All other components within normal limits  CBC - Abnormal; Notable for the following components:   Hemoglobin 11.9 (*)    RDW 15.9 (*)    All other components within normal limits  BRAIN NATRIURETIC PEPTIDE - Abnormal; Notable for the following components:   B Natriuretic Peptide 799.7 (*)    All other components within normal limits    EKG None  Radiology Dg Chest 2 View  Result Date: 07/03/2018 CLINICAL DATA:  Lower extremity edema and wheezing. EXAM: CHEST - 2 VIEW COMPARISON:  04/30/2018.  CT 03/07/2018. FINDINGS: Cardiomegaly with bilateral pulmonary interstitial prominence and bilateral pleural effusions consistent with CHF. Persistent mass lesion right upper lung. Persistent atelectatic changes right upper lung. Similar findings noted on prior exam. No acute bony abnormality. IMPRESSION: 1. Cardiomegaly with bilateral pulmonary interstitial prominence and bilateral pleural effusions consistent CHF. 2. Persistent  mass lesion and atelectasis right upper lung. Similar findings noted on prior exam. Electronically Signed   By: Marcello Moores  Register   On: 07/03/2018 12:53    Procedures Procedures (including critical care time)  Medications Ordered in ED Medications - No data to display   Initial Impression / Assessment and Plan / ED Course  I have reviewed the triage vital signs and the nursing notes.  Pertinent labs & imaging results that were available during my care of the patient were reviewed by me and considered in my medical decision making (see chart for details).     Patient presents with worsening leg swelling and is noted to have some cough and increased work of breathing.  Found to be in CHF.  Given she is been up to 80 mg a day by her cardiologist and is still having worsening symptoms I think she will need IV diuresis and admission to the hospital.  Discussed with Dyanne Carrel, who will admit.  Given IV Lasix in the ED.  Final Clinical Impressions(s) / ED Diagnoses   Final diagnoses:  Acute on chronic systolic congestive heart failure [facility]    ED Discharge Orders    None       Sherwood Gambler, MD 07/03/18 1341

## 2018-07-03 NOTE — Progress Notes (Signed)
Preliminary notes--Right lower extremity venous duplex exam completed. Negative for DVT.  Hongying Landry Mellow (RDMS RVT) 07/03/18 4:52 PM

## 2018-07-03 NOTE — H&P (Signed)
History and Physical    Michelle Levine GEX:[id number] DOB: [birth year] DOA: 07/03/2018  PCP: Crist Infante, MD Patient coming from: facility  Chief Complaint: bilateral lower extremity edema  HPI: Michelle Levine is a delightful 84 y.o. female with medical history significant for aortic insufficiency, mitral insufficiency and tricuspid regurgitation with moderate pulmonary hypertension, severe left atrial enlargement, metastatic lung cancer, atrial fibrillation, COPD, chronic respiratory failure with hypoxia on oxygen at 2 L continuous presents to the emergency department with chief complaint of worsening bilateral lower extremity edema. Initial evaluation concerning for acute on chronic heart failure. [facility] are asked to admit  Information is obtained from the chart and the patient and her granddaughter who is at the bedside. Patient reports lower extremity edema since her hip fracture several months ago. Over the last month it has been worsening. Last week she saw her cardiologist. Chart review indicates patient was overloaded at that time and her Lasix was increased to 80 mg daily. Plan was to follow-up in 4-6 weeks. However this morning she awakened and "it looked like I had wet my bed". She states she's had this clear liquid "weeping" out of both of her legs. She denies any pain. She denies any worsening shortness of breath noting that she is "always short of breath and always coughing". She's been compliant with her oxygen supplementation and is not sure about dietary restrictions. She reports she gets weighed daily but is unclear as if she's gained weight recently. As headache dizziness syncope or near-syncope. She denies chest pain palpitation diaphoresis nausea vomiting. She denies any dysuria hematuria frequency or urgency. She denies fever chills diarrhea constipation melena bright red blood per rectum.    ED Course: emergency department she's afebrile hemodynamically stable  with an oxygen saturation level 92% on 2 L nasal cannula. She is given 40 mg Lasix IV.  Review of Systems: As per HPI otherwise all other systems reviewed and are negative.   Ambulatory Status:mostly wheelchair/bed bound. Moderate assist with ADLs  Past Medical History:  Diagnosis Date  . A-fib ([place])   . Aortic insufficiency   . CAD (coronary artery disease)   . Cancer ([place])   . Cataracts, bilateral   . CHF (congestive heart failure) ([place])   . COPD (chronic obstructive pulmonary disease) ([place])   . Hypertension   . Mitral insufficiency   . Pulmonary hypertension ([place])   . Thyroid disease   . Tricuspid regurgitation     Past Surgical History:  Procedure Laterality Date  . ABDOMINAL HYSTERECTOMY    . APPENDECTOMY    . BREAST LUMPECTOMY    . CATARACT EXTRACTION    . EYE SURGERY    . FEMUR IM NAIL Left 01/30/2018   Procedure: INTRAMEDULLARY (IM) NAIL FEMORAL LEFT;  Surgeon: Paralee Cancel, MD;  Location: [facility];  Service: Orthopedics;  Laterality: Left;    Social History   Socioeconomic History  . Marital status: Married    Spouse name: Not on file  . Number of children: Not on file  . Years of education: Not on file  . Highest education level: Not on file  Occupational History  . Occupation: retired  Scientific laboratory technician  . Financial resource strain: Not on file  . Food insecurity:    Worry: Not on file    Inability: Not on file  . Transportation needs:    Medical: Not on file    Non-medical: Not on file  Tobacco Use  . Smoking status: Never Smoker  .  Smokeless tobacco: Never Used  Substance and Sexual Activity  . Alcohol use: No  . Drug use: No  . Sexual activity: Not on file  Lifestyle  . Physical activity:    Days per week: Not on file    Minutes per session: Not on file  . Stress: Not on file  Relationships  . Social connections:    Talks on phone: Not on file    Gets together: Not on file    Attends religious service: Not on file    Active member of club or  organization: Not on file    Attends meetings of clubs or organizations: Not on file    Relationship status: Not on file  . Intimate partner violence:    Fear of current or ex partner: Not on file    Emotionally abused: Not on file    Physically abused: Not on file    Forced sexual activity: Not on file  Other Topics Concern  . Not on file  Social History Narrative  . Not on file    No Known Allergies  Family History  Problem Relation Age of Onset  . Heart attack Mother 56  . Pneumonia Father 72  . Heart attack Brother   . Leukemia Brother   . Heart attack Brother   . Leukemia Brother     Prior to Admission medications   Medication Sig Start Date End Date Taking? Authorizing Provider  acetaminophen (TYLENOL) 325 MG tablet Take 1-2 tablets (325-650 mg total) by mouth every 6 (six) hours as needed for mild pain (pain score 1-3 or temp > 100.5). 02/01/18   Raiford Noble Latif, DO  amLODipine (NORVASC) 5 MG tablet Take 5 mg by mouth daily. 12/06/13   Lelon Perla, MD  apixaban (ELIQUIS) 2.5 MG TABS tablet Take 1 tablet (2.5 mg total) by mouth 2 (two) times daily. 04/12/18   Lelon Perla, MD  bisoprolol (ZEBETA) 10 MG tablet Take 0.5 tablets (5 mg total) by mouth daily. 04/12/18   Lelon Perla, MD  Calcium Carbonate-Vitamin D (CALCIUM + D PO) Take 1 capsule by mouth daily.     [provider]  citalopram (CELEXA) 20 MG tablet Take 20 mg by mouth daily.     [provider]  docusate sodium (COLACE) 100 MG capsule Take 1 capsule (100 mg total) by mouth 2 (two) times daily. 01/31/18   Danae Orleans, PA-C  ferrous sulfate 325 (65 FE) MG tablet Take 1 tablet (325 mg total) by mouth 3 (three) times daily after meals. 01/31/18   Danae Orleans, PA-C  fluticasone (FLONASE) 50 MCG/ACT nasal spray Place 1 spray into both nostrils daily as needed for allergies.  12/01/16   [provider]  furosemide (LASIX) 40 MG tablet Take 2 tablets (80 mg total) by mouth  daily. 06/19/18   Lelon Perla, MD  galantamine (RAZADYNE) 8 MG tablet TAKE 1 TABLET BY MOUTH EACH MORNING WITH FOOD 12/31/17   [provider]  HYDROcodone-acetaminophen (NORCO/VICODIN) 5-325 MG tablet Take 1-2 tablets by mouth every 4 (four) hours as needed for moderate pain or severe pain. 01/31/18   Danae Orleans, PA-C  ipratropium-albuterol (DUONEB) 0.5-2.5 (3) MG/3ML SOLN Take 3 mLs by nebulization 3 (three) times daily. 04/30/18   Lauraine Rinne, NP  KLOR-CON 10 10 MEQ tablet Take 10 mEq by mouth daily. 12/21/17   [provider]  MELATONIN PO Take by mouth.    [provider]  methimazole (TAPAZOLE) 10 MG  tablet Take 5 mg by mouth daily.     [provider]  Multiple Vitamin (MULITIVITAMIN WITH MINERALS) TABS Take 1 tablet by mouth daily.     [provider]  zoledronic acid (RECLAST) 5 MG/100ML SOLN Inject 5 mg into the vein. Reported on 11/30/2015    [provider]    Physical Exam: Vitals:   07/03/18 1215 07/03/18 1218 07/03/18 1230 07/03/18 1315  BP: 132/78  131/79 (!) 145/86  Pulse: 84  77 83  Resp: (!) 24  (!) 23 (!) 26  Temp:      TempSrc:      SpO2: 92% 92% 93% 90%     General:  Appears calm and comfortable, thin and frail chronically ill but in no acute distress Eyes:  PERRL, EOMI, normal lids, iris ENT:  grossly normal hearing, lips & tongue, mucous membranes of her mouth are slightly pale somewhat dry Neck:  no LAD, masses or thyromegaly Cardiovascular:  Irregularly irregular +murmur. 2+ LE edema bilaterally liquid oozing out of both legs with left more than right. Mild erythema no warmth Respiratory:  Respirations are somewhat shallow rapid breaths sounds coarse with scattered rhonchi diffuse wheezing. Moist nonproductive cough during exam Abdomen:  soft, ntnd, positive bowel sounds throughout no guarding or rebounding Skin:  no rash or induration seen on limited exam Musculoskeletal:  grossly normal tone  BUE/BLE, good ROM, no bony abnormality Psychiatric:  grossly normal mood and affect, speech fluent and appropriate, AOx3 Neurologic:  CN 2-12 grossly intact, moves all extremities in coordinated fashion, sensation intact  Labs on Admission: I have personally reviewed following labs and imaging studies  CBC: Recent Labs  Lab 07/03/18 1023  WBC 7.5  HGB 11.9*  HCT 39.1  MCV 98.7  PLT 811   Basic Metabolic Panel: Recent Labs  Lab 07/03/18 1023  NA 141  K 3.5  CL 100  CO2 33*  GLUCOSE 107*  BUN 17  CREATININE 0.82  CALCIUM 9.0   GFR: CrCl cannot be calculated (Unknown ideal weight.). Liver Function Tests: No results for input(s): AST, ALT, ALKPHOS, BILITOT, PROT, ALBUMIN in the last 168 hours. No results for input(s): LIPASE, AMYLASE in the last 168 hours. No results for input(s): AMMONIA in the last 168 hours. Coagulation Profile: No results for input(s): INR, PROTIME in the last 168 hours. Cardiac Enzymes: No results for input(s): CKTOTAL, CKMB, CKMBINDEX, TROPONINI in the last 168 hours. BNP (last 3 results) No results for input(s): PROBNP in the last 8760 hours. HbA1C: No results for input(s): HGBA1C in the last 72 hours. CBG: No results for input(s): GLUCAP in the last 168 hours. Lipid Profile: No results for input(s): CHOL, HDL, LDLCALC, TRIG, CHOLHDL, LDLDIRECT in the last 72 hours. Thyroid Function Tests: No results for input(s): TSH, T4TOTAL, FREET4, T3FREE, THYROIDAB in the last 72 hours. Anemia Panel: No results for input(s): VITAMINB12, FOLATE, FERRITIN, TIBC, IRON, RETICCTPCT in the last 72 hours. Urine analysis: No results found for: COLORURINE, APPEARANCEUR, LABSPEC, PHURINE, GLUCOSEU, HGBUR, BILIRUBINUR, KETONESUR, PROTEINUR, UROBILINOGEN, NITRITE, LEUKOCYTESUR  Creatinine Clearance: CrCl cannot be calculated (Unknown ideal weight.).  Sepsis Labs: @LABRCNTIP (procalcitonin:4,lacticidven:4) )No results found for this or any previous visit (from the  past 240 hour(s)).   Radiological Exams on Admission: Dg Chest 2 View  Result Date: 07/03/2018 CLINICAL DATA:  Lower extremity edema and wheezing. EXAM: CHEST - 2 VIEW COMPARISON:  04/30/2018.  CT 03/07/2018. FINDINGS: Cardiomegaly with bilateral pulmonary interstitial prominence and bilateral pleural effusions consistent with CHF. Persistent mass lesion  right upper lung. Persistent atelectatic changes right upper lung. Similar findings noted on prior exam. No acute bony abnormality. IMPRESSION: 1. Cardiomegaly with bilateral pulmonary interstitial prominence and bilateral pleural effusions consistent CHF. 2. Persistent mass lesion and atelectasis right upper lung. Similar findings noted on prior exam. Electronically Signed   By: [place]   On: 07/03/2018 12:53    EKG: Independently reviewed. Atrial fibrillation Ventricular premature complex Left posterior fascicular block  Assessment/Plan Principal Problem:   Acute on chronic diastolic heart failure (HCC) Active Problems:   Chronic respiratory failure with hypoxia (HCC)   Bilateral leg edema   Aortic insufficiency   COPD mixed type (HCC)   Essential hypertension   A-fib ([place])   Bronchogenic cancer of right lung ([place])   #1. Acute on chronic diastolic heart failure/bilateral leg edema. BNP only slightly elevated. Bilateral lower extremity edema with weeping. Chest x-ray with cardiomegaly bilateral pulmonary interstitial prominence and bilateral pleural effusion consistent with CHF. Echo 2014 revealed an EF of 50-55% with significant valvular insufficiencies and severe left atrial enlargement. Chart review indicates follow-up echo has not been done since that time as patient is not interested in valve surgery. However medications were adjusted week ago for worsening pedal edema. -Admit to telemetry -Continue IV Lasix with dose to be adjusted by cardiology -Continue beta blocker -Obtain daily weights -Monitor intake and  output -Obtain a 2-D echo -Compression stockings -Request cardiology consult for recommendations  #2.chronic respiratory failure with hypoxia/COPD. This with a history of COPD and bronchogenic cancer of right lung. Appears stable at baseline. Home medications to include nebulizer. oxygen saturation level greater than 90% on 2 L nasal cannula -continue oxygen supplementation -Nebulizers -Antitussives -Monitor  #3. Atrial fibrillation. Rate controlled. Medications include L request and beta blocker -Continue our request and beta blocker -Monitor  4.hypertension. Controlled. Home medications include Lasix, Norvasc,bisoprolol -Monitor -Continue home meds  5. Aortic insufficiency. Patient recently visited cardiology. Office note indicates patient has significant valvular disease. No further workup as she is clear that she would not consider surgery. Seems appropriate given her age and overall medical condition. Plan to treat conservatively.   DVT prophylaxis: eliquis  Code Status: dnr  Family Communication: granddaughter  Disposition Plan: back to facility  Consults called: cardmaster  Admission status: inpatient    Radene Gunning MD [facility]  If 7PM-7AM, please contact night-coverage [email or web address] Password [facility]  07/03/2018, 2:17 PM

## 2018-07-03 NOTE — Consult Note (Addendum)
Cardiology Consult    Patient ID: Michelle Levine MRN: [id number], DOB/AGE: 04/25/1922   Admit date: 07/03/2018 Date of Consult: 07/03/2018  Primary Physician: Crist Infante, MD Primary Cardiologist: Dr. Stanford Breed Requesting Provider: Dr. Lorin Mercy Reason for Consultation: Acute on chronic HF  Michelle Levine is a 84 y.o. female who is being seen today for the evaluation of acute on chronic HF at the request of Dr. Lorin Mercy.   Patient Profile    84 yo female with PMH of AI, mitral insufficiency, TR, pulmonary edema, HTN, COPD, metastatic lung Ca, Afib and chronic respiratory failure who presented with bilateral LE edema.   Past Medical History   Past Medical History:  Diagnosis Date  . A-fib ([place])   . Aortic insufficiency   . CAD (coronary artery disease)   . Cancer ([place])   . Cataracts, bilateral   . CHF (congestive heart failure) ([place])   . COPD (chronic obstructive pulmonary disease) ([place])   . Hypertension   . Mitral insufficiency   . Pulmonary hypertension ([place])   . Thyroid disease   . Tricuspid regurgitation     Past Surgical History:  Procedure Laterality Date  . ABDOMINAL HYSTERECTOMY    . APPENDECTOMY    . BREAST LUMPECTOMY    . CATARACT EXTRACTION    . EYE SURGERY    . FEMUR IM NAIL Left 01/30/2018   Procedure: INTRAMEDULLARY (IM) NAIL FEMORAL LEFT;  Surgeon: Paralee Cancel, MD;  Location: [facility];  Service: Orthopedics;  Laterality: Left;     Allergies  No Known Allergies  History of Present Illness    Michelle Levine is a 84 yo female with PMH of AI, mitral insufficiency, TR, pulmonary edema, HTN, COPD, metastatic lung Ca, Afib and chronic respiratory failure. She is followed by Dr. Stanford Breed as an outpatient. Her last echo was back in 9/14 and showed an EF of 50-55% with moderate aortic insufficiency, bileaflet mitral valve prolapse with moderate MR, moderate TR and severe left atrial enlargement. She was last seen in the office on 06/19/18 and decision was made  not to follow echo as she stated never considering the option of valve surgery. She has been maintained on bisoprolol and low dose Eliquis 2.5mg  BID. At this last office visit she was noted to be volume overloaded with significant pedal edema. Her lasix was increased to 80mg  daily with follow up in 4-6 weeks. She currently lives at [facility]. She requires assistance with most of her ADLs. She called into the office today reporting worsening LE edema and noticed her legs were "leaking" with her bed sheets being wet. Does not weigh herself daily. Had not noticed that her shortness of breath had been any worse than her baseline. Given her symptoms she was directed to the ED for further evaluation.  In the ED her labs showed stable electrolytes, Cr 0.82, Hgb 11.9, BNP 799. CXR with bilateral pleural effusions. EKG with Afib rate 91. She was given IV lasix in the ED and admitted to Internal Medicine for further work up.   Inpatient Medications    . apixaban  2.5 mg Oral BID  . furosemide  60 mg Intravenous BID  . ipratropium-albuterol  3 mL Nebulization Q6H  . potassium chloride  20 mEq Oral Once  . sodium chloride flush  3 mL Intravenous Q12H    Family History    Family History  Problem Relation Age of Onset  . Heart attack Mother 47  . Pneumonia Father 21  .  Heart attack Brother   . Leukemia Brother   . Heart attack Brother   . Leukemia Brother     Social History    Social History   Socioeconomic History  . Marital status: Married    Spouse name: Not on file  . Number of children: Not on file  . Years of education: Not on file  . Highest education level: Not on file  Occupational History  . Occupation: retired  Scientific laboratory technician  . Financial resource strain: Not on file  . Food insecurity:    Worry: Not on file    Inability: Not on file  . Transportation needs:    Medical: Not on file    Non-medical: Not on file  Tobacco Use  . Smoking status: Never Smoker  . Smokeless  tobacco: Never Used  Substance and Sexual Activity  . Alcohol use: No  . Drug use: No  . Sexual activity: Not on file  Lifestyle  . Physical activity:    Days per week: Not on file    Minutes per session: Not on file  . Stress: Not on file  Relationships  . Social connections:    Talks on phone: Not on file    Gets together: Not on file    Attends religious service: Not on file    Active member of club or organization: Not on file    Attends meetings of clubs or organizations: Not on file    Relationship status: Not on file  . Intimate partner violence:    Fear of current or ex partner: Not on file    Emotionally abused: Not on file    Physically abused: Not on file    Forced sexual activity: Not on file  Other Topics Concern  . Not on file  Social History Narrative  . Not on file     Review of Systems    See HPI  All other systems reviewed and are otherwise negative except as noted above.  Physical Exam    Blood pressure (!) 141/80, pulse 94, temperature 98.4 F (36.9 C), temperature source Oral, resp. rate 18, SpO2 94 %.  General: Pleasant, frail older WF, NAD, wearing [place] at 3L.  Psych: Normal affect. Neuro: Alert and oriented X 3. Moves all extremities spontaneously. HEENT: Normal  Neck: Supple, + JVD. Lungs:  Resp regular and mildly labored, crackles in bases with expiratory wheezing. Heart: Irreg Irreg, 3/6 systolic murmurs. Abdomen: Soft, non-tender, non-distended, BS + x 4.  Extremities: No clubbing, cyanosis, 2+ pitting edema to bilateral LE to knees. Right slightly larger than the left.   Labs    Troponin (Point of Care Test) No results for input(s): TROPIPOC in the last 72 hours. No results for input(s): CKTOTAL, CKMB, TROPONINI in the last 72 hours. Lab Results  Component Value Date   WBC 7.5 07/03/2018   HGB 11.9 (L) 07/03/2018   HCT 39.1 07/03/2018   MCV 98.7 07/03/2018   PLT 160 07/03/2018    Recent Labs  Lab 07/03/18 1023  NA 141  K 3.5   CL 100  CO2 33*  BUN 17  CREATININE 0.82  CALCIUM 9.0  GLUCOSE 107*   No results found for: CHOL, HDL, LDLCALC, TRIG Lab Results  Component Value Date   DDIMER 0.58 (H) 10/25/2011     Radiology Studies    Dg Chest 2 View  Result Date: 07/03/2018 CLINICAL DATA:  Lower extremity edema and wheezing. EXAM: CHEST - 2 VIEW COMPARISON:  04/30/2018.  CT 03/07/2018. FINDINGS: Cardiomegaly with bilateral pulmonary interstitial prominence and bilateral pleural effusions consistent with CHF. Persistent mass lesion right upper lung. Persistent atelectatic changes right upper lung. Similar findings noted on prior exam. No acute bony abnormality. IMPRESSION: 1. Cardiomegaly with bilateral pulmonary interstitial prominence and bilateral pleural effusions consistent CHF. 2. Persistent mass lesion and atelectasis right upper lung. Similar findings noted on prior exam. Electronically Signed   By: [place]   On: 07/03/2018 12:53    ECG & Cardiac Imaging    EKG:  The EKG was personally reviewed and demonstrates Afib rate 91  Echo: 07/17/13  Study Conclusions  - Left ventricle: The cavity size was mildly dilated. Wall thickness was normal. Systolic function was normal. The estimated ejection fraction was in the range of 50% to 55%. - Aortic valve: Moderate regurgitation. - Mitral valve: Bileaflet prolapse worse in posterior leaflet Moderate regurgitation. - Left atrium: The atrium was severely dilated. - Atrial septum: No defect or patent foramen ovale was identified. - Tricuspid valve: Moderate regurgitation. - Pulmonary arteries: PA peak pressure: 59mm Hg (S).  Assessment & Plan    84 yo female with PMH of AI, mitral insufficiency, TR, pulmonary edema, HTN, COPD, metastatic lung Ca, Afib and chronic respiratory failure who presented with bilateral LE edema.   1. Acute on Chronic diastolic HF with LE edema: Recently seen in the office and had her lasix increased to 80mg   daily. States the weeping in her legs worsened over the past couple days and her bed sheets were wet this morning. Does not weigh herself daily, and not sure about her Na+ intake as she is at an ALF. BNP >700 on admission, with pitting edema in bilateral LE. Given a dose of IV lasix 40mg  in the ED. RN reports 400cc UOP thus far.  -- Would continue with IV diuresis and daily weights.  -- echo pending per primary -- primary order doppler study to rule out DVT given RLE >LLE  2. Aortic insufficiency: Known to have significant valvular disease on previous echo back in 2014. It has been clear from previous office visits that she is not interested to pursuing valve surgery and this is reasonable given her age and co-morbities. Suspect this is playing a role in her acute HF symptoms this admission. For now will plan to manage medically with diuretics.   3. Persistent Afib: Rate controlled on admission. Would continue bisoprolol and low dose Eliquis.   4. HTN: continue home medications  5. COPD: uses 2L [place] regularly.   Barnet Pall, NP-C Pager [phone] 07/03/2018, 3:25 PM As above, patient seen and examined.  Briefly she is a 84 year old female with past medical history of persistent atrial fibrillation, aortic insufficiency/mitral regurgitation, hypertension, COPD, metastatic lung cancer for evaluation of acute on chronic diastolic congestive heart failure.  Patient recently seen in the office with worsening pedal edema.  Lasix was increased.  However she has continued to have worsening lower extremity edema and some dyspnea.  She denies chest pain, palpitations, syncope or hemoptysis.  She was admitted and cardiology asked to evaluate.  Physical exam shows 3+ bilateral lower extremity edema. Laboratory show creatinine 0.82, BNP 800.  Electrocardiogram shows atrial flutter, PVC, anterior infarct, left posterior fascicular block.  1 acute on chronic diastolic congestive heart  failure-patient is significantly volume overloaded.  Continue Lasix 60 mg IV twice daily and follow renal function.  Patient needs fluid restriction and low-sodium diet.  2 aortic insufficiency/mitral regurgitation-primary care has ordered a  follow-up echocardiogram.  However patient would not be a candidate for valve surgery given age and metastatic lung cancer.  3 persistent atrial fibrillation-this is likely contributing to her congestive heart failure.  We will continue beta-blocker for rate control. CHADSvasc 4.  Continue apixaban.  I would be hesitant to go cardiovert as she is extremely frail.  Also not clear she would hold sinus rhythm given pulmonary disease.  4 metastatic lung cancer-Per oncology.  5 COPD-Per primary care.  Kirk Ruths, MD

## 2018-07-04 ENCOUNTER — Inpatient Hospital Stay (HOSPITAL_COMMUNITY): Payer: Medicare Other

## 2018-07-04 DIAGNOSIS — J449 Chronic obstructive pulmonary disease, unspecified: Secondary | ICD-10-CM

## 2018-07-04 DIAGNOSIS — R0600 Dyspnea, unspecified: Secondary | ICD-10-CM

## 2018-07-04 LAB — BASIC METABOLIC PANEL
Anion gap: 8 (ref 5–15)
BUN: 15 mg/dL (ref 8–23)
CALCIUM: 9 mg/dL (ref 8.9–10.3)
CHLORIDE: 103 mmol/L (ref 98–111)
CO2: 31 mmol/L (ref 22–32)
CREATININE: 0.9 mg/dL (ref 0.44–1.00)
GFR calc non Af Amer: 53 mL/min — ABNORMAL LOW (ref >60)
Glucose, Bld: 97 mg/dL (ref 70–99)
Potassium: 3.6 mmol/L (ref 3.5–5.1)
SODIUM: 142 mmol/L (ref 135–145)

## 2018-07-04 LAB — ECHOCARDIOGRAM COMPLETE
HEIGHTINCHES: 65 in
Weight: 1916.8 oz

## 2018-07-04 MED ORDER — MUPIROCIN 2 % EX OINT
1.0000 "application " | TOPICAL_OINTMENT | Freq: Two times a day (BID) | CUTANEOUS | Status: AC
  Administered 2018-07-04 – 2018-07-08 (×10): 1 via NASAL
  Filled 2018-07-04 (×3): qty 22

## 2018-07-04 MED ORDER — CHLORHEXIDINE GLUCONATE CLOTH 2 % EX PADS
6.0000 | MEDICATED_PAD | Freq: Every day | CUTANEOUS | Status: AC
  Administered 2018-07-05 – 2018-07-08 (×4): 6 via TOPICAL

## 2018-07-04 NOTE — NC FL2 (Signed)
[place] LEVEL OF CARE SCREENING TOOL     IDENTIFICATION  Patient Name: Michelle Levine Birthdate: 1922-05-11 Sex: female Admission Date (Current Location): 07/03/2018  [facility] and [place] Number:  Herbalist and Address:  [facility] [place]. [facility], [place] [address], [place], [place] [address]      Provider Number: [id number]  Attending Physician Name and Address:  Tawni Millers  Relative Name and Phone Number:       Current Level of Care: Hospital Recommended Level of Care: [place] Prior Approval Number:    Date Approved/Denied:   PASRR Number: [id number]  Discharge Plan: SNF    Current Diagnoses: Patient Active Problem List   Diagnosis Date Noted  . Acute respiratory distress 07/03/2018  . Bilateral leg edema 07/03/2018  . Acute on chronic diastolic heart failure ([place]) 07/03/2018  . A-fib ([place])   . Chronic respiratory failure with hypoxia ([place]) 04/30/2018  . Thrombocytopenia ([place]) 01/31/2018  . Leukocytosis 01/31/2018  . Acute blood loss as cause of postoperative anemia 01/31/2018  . Hyponatremia 01/31/2018  . S/p left hip fracture 01/29/2018  . Sternal fracture 10/07/2016  . Acute asthmatic bronchitis 09/05/2016  . Radiation pneumonitis ([place]) 10/13/2015  . Chronic cough 07/03/2014  . Bronchogenic cancer of right lung ([place]) 03/20/2014  . Multiple lung nodules 11/18/2012  . Aortic insufficiency   . Mitral insufficiency   . Mild tricuspid regurgitation   . Pulmonary hypertension ([place])   . CHF (congestive heart failure) ([place])   . COPD mixed type ([place])   . Essential hypertension     Orientation RESPIRATION BLADDER Height & Weight     Self, Time, Situation, Place  O2(Nasal Canula 3 L) Continent Weight: 119 lb 12.8 oz (54.3 kg) Height:  5\' 5"  (165.1 cm)  BEHAVIORAL SYMPTOMS/MOOD NEUROLOGICAL BOWEL NUTRITION STATUS  (None) (None) Continent Diet(2 gram sodium. Fluid restriction 1200 mL.)   AMBULATORY STATUS COMMUNICATION OF NEEDS Skin     Verbally Other (Comment)(Weeping.)                       Personal Care Assistance Level of Assistance              Functional Limitations Info  Sight, Hearing, Speech Sight Info: Adequate Hearing Info: Adequate Speech Info: Adequate    SPECIAL CARE FACTORS FREQUENCY  Blood pressure                    Contractures Contractures Info: Not present    Additional Factors Info  Code Status, Allergies Code Status Info: DNR Allergies Info: NKDA           Current Medications (07/04/2018):  This is the current hospital active medication list Current Facility-Administered Medications  Medication Dose Route Frequency Provider Last Rate Last Dose  . 0.9 %  sodium chloride infusion  250 mL Intravenous PRN Radene Gunning, NP      . acetaminophen (TYLENOL) tablet 650 mg  650 mg Oral Q4H PRN Radene Gunning, NP      . apixaban Arne Cleveland) tablet 2.5 mg  2.5 mg Oral BID Radene Gunning, NP   2.5 mg at 07/04/18 0904  . bisoprolol (ZEBETA) tablet 10 mg  10 mg Oral Daily Reino Bellis B, NP   10 mg at 07/04/18 1058  . Chlorhexidine Gluconate Cloth 2 % PADS 6 each  6 each Topical Q0600 Arrien, Jimmy Picket, MD      . furosemide (  LASIX) injection 60 mg  60 mg Intravenous BID Radene Gunning, NP   60 mg at 07/04/18 1779  . ipratropium-albuterol (DUONEB) 0.5-2.5 (3) MG/3ML nebulizer solution 3 mL  3 mL Nebulization TID Radene Gunning, NP   3 mL at 07/04/18 0736  . mupirocin ointment (BACTROBAN) 2 % 1 application  1 application Nasal BID Arrien, Jimmy Picket, MD   1 application at 39/03/00 [phone]  . ondansetron (ZOFRAN) injection 4 mg  4 mg Intravenous Q6H PRN Radene Gunning, NP      . sodium chloride flush (NS) 0.9 % injection 3 mL  3 mL Intravenous Q12H Radene Gunning, NP   3 mL at 07/04/18 0905  . sodium chloride flush (NS) 0.9 % injection 3 mL  3 mL Intravenous PRN Black, Lezlie Octave, NP         Discharge Medications: Please see  discharge summary for a list of discharge medications.  Relevant Imaging Results:  Relevant Lab Results:   Additional Information SS#: [id number]  Candie Chroman, LCSW

## 2018-07-04 NOTE — Progress Notes (Signed)
PROGRESS NOTE    Michelle Levine  DGL:[id number] DOB: 01/02/1922 DOA: 07/03/2018 PCP: Crist Infante, MD    Brief Narrative:  84 year old female who presented with lower extremity edema.  She does have significant past medical history for aortic insufficiency, mitral insufficiency and tricuspid regurgitation with moderate pulmonary hypertension.  Metastatic lung cancer, atrial fibrillation, COPD with chronic hypoxic respiratory failure.  Reported worsening lower extremity edema for last 4 weeks, refractory to outpatient therapy with oral furosemide (increased dose, to 80 mg daily).  She presented to the hospital due to persistent symptoms.  On the initial physical examination blood pressure 132/78, heart rate 84, respirate 24, oxygen saturation 92%.  Heart with S1 S2 present, irregularly irregular, 3-6 systolic murmur at the apex.  Lungs with decreased breath sounds bilaterally, diffuse rhonchi and wheezing.  Abdomen soft nontender, positive ++++ lower extremity edema.   Patient was admitted to the hospital with working diagnosis of acute decompensated heart failure.  Assessment & Plan:   Principal Problem:   Acute on chronic diastolic heart failure (HCC) Active Problems:   Aortic insufficiency   COPD mixed type (HCC)   Essential hypertension   Bronchogenic cancer of right lung (HCC)   Chronic respiratory failure with hypoxia (HCC)   Bilateral leg edema   A-fib ([place])   1. Acute on chronic diastolic heart failure decompensation. Will continue aggressive diuresis with furosemide 40 mg IV q12, to target negative fluid balance, urine output over last 24 hours 1,050 ml. Continue heart failure management with bisoprolol. Blood pressure 518 to 841 systolic. Continue telemetry monitoring and physical therapy evaluation.   2. Valvular heart disease with severe mitral regurgitation. Will continue medical therapy with diuresis and beta blockade. Will follow on echocardiogram.   3. Atrial  fibrillation. Continue rate control with bisoprolol and anticoagulation with apixaban.   4. Hypokalemia. Target serum K of 4, continue k correction with kc, will follow on renal panel in am. Continue diuresis with loop diuretics.   DVT prophylaxis: apixaban  Code Status: dnr Family Communication: no family at the bedside Disposition Plan/ discharge barriers: pending clinical improvement.    Consultants:   Cardiology   Procedures:     Antimicrobials:       Subjective: Patient reports improvement of edema and dyspnea, not back to baseline, no nausea or vomiting, no chest pain.   Objective: Vitals:   07/04/18 0048 07/04/18 0547 07/04/18 0736 07/04/18 1102  BP: 131/76 127/90  111/65  Pulse: 81 (!) 49  61  Resp: 18 20  (!) 22  Temp: 98 F (36.7 C) 97.9 F (36.6 C)  98.4 F (36.9 C)  TempSrc: Oral Oral  Oral  SpO2: 94% 91% 98% 92%  Weight:  54.3 kg    Height:        Intake/Output Summary (Last 24 hours) at 07/04/2018 1220 Last data filed at 07/04/2018 1200 Gross per 24 hour  Intake 966 ml  Output 1802 ml  Net -836 ml   Filed Weights   07/03/18 1457 07/04/18 0547  Weight: 57.9 kg 54.3 kg    Examination:   General: Not in pain or dyspnea, deconditioned  Neurology: Awake and alert, non focal  E ENT: mild pallor, no icterus, oral mucosa moist Cardiovascular: No JVD. S1-S2 present, rhythmic, no gallops,or  rubs, 3/6 systolic murmur at the apex, radiated to the axilla. +++ pitting lower extremity edema. Pulmonary: positive breath sounds bilaterally, decreased air movement, no wheezing,or rhonchi, scattered rales. Gastrointestinal. Abdomen with, no organomegaly, non tender, no  rebound or guarding Skin. No rashes Musculoskeletal: no joint deformities     Data Reviewed: I have personally reviewed following labs and imaging studies  CBC: Recent Labs  Lab 07/03/18 1023  WBC 7.5  HGB 11.9*  HCT 39.1  MCV 98.7  PLT 395   Basic Metabolic Panel: Recent Labs    Lab 07/03/18 1023 07/04/18 0510  NA 141 142  K 3.5 3.6  CL 100 103  CO2 33* 31  GLUCOSE 107* 97  BUN 17 15  CREATININE 0.82 0.90  CALCIUM 9.0 9.0   GFR: Estimated Creatinine Clearance: 33.5 mL/min (by C-G formula based on SCr of 0.9 mg/dL). Liver Function Tests: No results for input(s): AST, ALT, ALKPHOS, BILITOT, PROT, ALBUMIN in the last 168 hours. No results for input(s): LIPASE, AMYLASE in the last 168 hours. No results for input(s): AMMONIA in the last 168 hours. Coagulation Profile: No results for input(s): INR, PROTIME in the last 168 hours. Cardiac Enzymes: No results for input(s): CKTOTAL, CKMB, CKMBINDEX, TROPONINI in the last 168 hours. BNP (last 3 results) No results for input(s): PROBNP in the last 8760 hours. HbA1C: No results for input(s): HGBA1C in the last 72 hours. CBG: No results for input(s): GLUCAP in the last 168 hours. Lipid Profile: No results for input(s): CHOL, HDL, LDLCALC, TRIG, CHOLHDL, LDLDIRECT in the last 72 hours. Thyroid Function Tests: No results for input(s): TSH, T4TOTAL, FREET4, T3FREE, THYROIDAB in the last 72 hours. Anemia Panel: No results for input(s): VITAMINB12, FOLATE, FERRITIN, TIBC, IRON, RETICCTPCT in the last 72 hours.    Radiology Studies: I have reviewed all of the imaging during this hospital visit personally     Scheduled Meds: . apixaban  2.5 mg Oral BID  . bisoprolol  10 mg Oral Daily  . Chlorhexidine Gluconate Cloth  6 each Topical Q0600  . furosemide  60 mg Intravenous BID  . ipratropium-albuterol  3 mL Nebulization TID  . mupirocin ointment  1 application Nasal BID  . sodium chloride flush  3 mL Intravenous Q12H   Continuous Infusions: . sodium chloride       LOS: 1 day        [name] Gerome Apley, MD [facility] Pager [phone]

## 2018-07-04 NOTE — Clinical Social Work Note (Signed)
Clinical Social Work Assessment  Patient Details  Name: Michelle Levine MRN: [id number] Date of Birth: [birth year]  Date of referral:  07/04/18               Reason for consult:  Discharge Planning                Permission sought to share information with:  Chartered certified accountant granted to share information::  Yes, Verbal Permission Granted  Name::        Agency::  [facility]  Relationship::     Contact Information:     Housing/Transportation Living arrangements for the past 2 months:  [place] of Information:  Patient, Scientist, water quality, Facility Patient Interpreter Needed:  None Criminal Activity/Legal Involvement Pertinent to Current Situation/Hospitalization:  No - Comment as needed Significant Relationships:  Adult Children, Other Family Members, Spouse Lives with:  Facility Resident Do you feel safe going back to the place where you live?  Yes Need for family participation in patient care:  Yes (Comment)  Care giving concerns:  Patient is a long-[facility] resident at [facility].   Social Worker assessment / plan:  CSW met with patient. RN and tech at bedside. CSW introduced role and explained that discharge planning would be discussed. Patient confirmed she is from [facility] and is eager to return. Per admissions coordinator, she is long-term on their skilled side. No further concerns. CSW encouraged patient to contact CSW as needed. CSW will continue to follow patient for support and facilitate discharge back to SNF once medically stable.  Employment status:  Retired Forensic scientist:  Medicare PT Recommendations:  Not assessed at this time [place] / Referral to community resources:  [place]  Patient/Family's Response to care:  Patient agreeable to return to SNF. Patient's family supportive and involved in patient's care. Patient appreciated social work intervention.  Patient/Family's Understanding of  and Emotional Response to Diagnosis, Current Treatment, and Prognosis:  Patient has a good understanding of the reason for admission and plan to return to SNF once medically stable. Patient appears happy with hospital care.  Emotional Assessment Appearance:  Appears stated age Attitude/Demeanor/Rapport:  Engaged, Gracious Affect (typically observed):  Accepting, Appropriate, Calm, Pleasant Orientation:  Oriented to Self, Oriented to Place, Oriented to  Time, Oriented to Situation Alcohol / Substance use:  Never Used Psych involvement (Current and /or in the community):  No (Comment)  Discharge Needs  Concerns to be addressed:  Childcare Concerns Readmission within the last 30 days:  No Current discharge risk:  None Barriers to Discharge:  Continued Medical Work up   Candie Chroman, LCSW 07/04/2018, 11:10 AM

## 2018-07-04 NOTE — Progress Notes (Signed)
  Echocardiogram 2D Echocardiogram has been performed.  Michelle Levine 07/04/2018, 12:19 PM

## 2018-07-04 NOTE — Plan of Care (Signed)
  Problem: Activity: Goal: Risk for activity intolerance will decrease Outcome: Progressing   Problem: Elimination: Goal: Will not experience complications related to bowel motility Outcome: Progressing   

## 2018-07-04 NOTE — Progress Notes (Signed)
Patients daughter concerned that she has not heard from medical team and would like to be updated regarding patients test results and discharge information. States that family need advanced notice before discharge as they live out of town. Will pass information to day shift team.  Daughters phone number in chart and on sticky note.

## 2018-07-04 NOTE — Progress Notes (Addendum)
The patient has been seen in conjunction with Lyda Jester, PA-C. All aspects of care have been considered and discussed. The patient has been personally interviewed, examined, and all clinical data has been reviewed.   Frail, Elderly, female with significant valvular heart disease including aortic and mitral regurgitation, chronic atrial fibrillation, chronic anticoagulation therapy, and significantly reduced EF, less than 35%.  Admitted after developing progressive lower extremity edema.  Plan is IV diuresis to control volume.  Overall, prognosis is poor given age, frailty, metastatic cancer and severity of structural heart disease.  Goals of care should be discussed as should consideration of palliative care/hospice.  Progress Note  Patient Name: Michelle Levine Date of Encounter: 07/04/2018  Primary Cardiologist: Kirk Ruths, MD   Subjective   A little agitated at the moment, upset b/c she states it is taking to long to get echo done. She otherwise denies any significant symptoms. Breathing has improved. No chest pain.   Inpatient Medications    Scheduled Meds: . apixaban  2.5 mg Oral BID  . bisoprolol  10 mg Oral Daily  . Chlorhexidine Gluconate Cloth  6 each Topical Q0600  . furosemide  60 mg Intravenous BID  . ipratropium-albuterol  3 mL Nebulization TID  . mupirocin ointment  1 application Nasal BID  . sodium chloride flush  3 mL Intravenous Q12H   Continuous Infusions: . sodium chloride     PRN Meds: sodium chloride, acetaminophen, ondansetron (ZOFRAN) IV, sodium chloride flush   Vital Signs    Vitals:   07/04/18 0048 07/04/18 0547 07/04/18 0736 07/04/18 1102  BP: 131/76 127/90  111/65  Pulse: 81 (!) 49  61  Resp: 18 20  (!) 22  Temp: 98 F (36.7 C) 97.9 F (36.6 C)  98.4 F (36.9 C)  TempSrc: Oral Oral  Oral  SpO2: 94% 91% 98% 92%  Weight:  54.3 kg    Height:        Intake/Output Summary (Last 24 hours) at 07/04/2018 1117 Last data filed  at 07/04/2018 0903 Gross per 24 hour  Intake 966 ml  Output 1452 ml  Net -486 ml   Filed Weights   07/03/18 1457 07/04/18 0547  Weight: 57.9 kg 54.3 kg    Telemetry    Atrial fibrillation in the 70s - Personally Reviewed  ECG    Atrial fibrillation 91 bpm - Personally Reviewed  Physical Exam   GEN: elderly, thin WF in no acute distress.   Neck: No JVD Cardiac: irregularly irregular rhythm, regular rate, 3/6 SM Respiratory: faint bibasilar crackles GI: Soft, nontender, non-distended  MS: 2+ bilateral LEE; No deformity. Neuro:  Nonfocal  Psych: Normal affect   Labs    Chemistry Recent Labs  Lab 07/03/18 1023 07/04/18 0510  NA 141 142  K 3.5 3.6  CL 100 103  CO2 33* 31  GLUCOSE 107* 97  BUN 17 15  CREATININE 0.82 0.90  CALCIUM 9.0 9.0  GFRNONAA 60* 53*  GFRAA >60 >60  ANIONGAP 8 8     Hematology Recent Labs  Lab 07/03/18 1023  WBC 7.5  RBC 3.96  HGB 11.9*  HCT 39.1  MCV 98.7  MCH 30.1  MCHC 30.4  RDW 15.9*  PLT 160    Cardiac EnzymesNo results for input(s): TROPONINI in the last 168 hours. No results for input(s): TROPIPOC in the last 168 hours.   BNP Recent Labs  Lab 07/03/18 1023  BNP 799.7*     DDimer No results for input(s): DDIMER  in the last 168 hours.   Radiology    Dg Chest 2 View  Result Date: 07/03/2018 CLINICAL DATA:  Lower extremity edema and wheezing. EXAM: CHEST - 2 VIEW COMPARISON:  04/30/2018.  CT 03/07/2018. FINDINGS: Cardiomegaly with bilateral pulmonary interstitial prominence and bilateral pleural effusions consistent with CHF. Persistent mass lesion right upper lung. Persistent atelectatic changes right upper lung. Similar findings noted on prior exam. No acute bony abnormality. IMPRESSION: 1. Cardiomegaly with bilateral pulmonary interstitial prominence and bilateral pleural effusions consistent CHF. 2. Persistent mass lesion and atelectasis right upper lung. Similar findings noted on prior exam. Electronically Signed    By: Marcello Moores  Register   On: 07/03/2018 12:53    Cardiac Studies   2D Echo 07/04/18 - pending   Patient Profile     84 yo female with PMH of AI, mitral insufficiency, TR, pulmonary edema, HTN, COPD, metastatic lung Ca, Afib and chronic respiratory failure who presented with bilateral LE edema. Cardiology consulted for evaluation of acute on chronic HF and persistent atrial fibrillation.   Assessment & Plan     1. Acute on chronic diastolic CHF: echo completed today, results pending. Pt admitted for volume overload. Symptomatic with dyspnea upon admit. BNP 800. She is being treated with IV Lasix, at 60 mg BID. 1L out in UOP yesterday. SCr stable at 0.90. K WNL at 3.6 and BP stable in the 267T systolic. She notes some improvement in breathing. Still volume overloaded with bilateral Lower extremity pitting edema on exam. Continue IV Lasix for diuresis. Continue strict I/Os, daily weights, low sodium diet and repeat BMP tomorrow to monitor renal function and K.   3. Aortic Insuffiencey/ Mitral Regurgitation:  Known to have significant valvular disease on previous echo back in 2014. It has been clear from previous office visits that she is not interested to pursuing valve surgery and this is reasonable given her age and co-morbities including metastatic lung cancer. Suspect this is playing a role in her acute HF symptoms this admission. For now will plan to manage medically with diuretics.   4. Persistent Atrial Fibrillation: likely contributing to her CHF, however we will continue with rate control strategy + anticoagulation only for now. We would be hesitant to cardiovert given she is extremely frail. Also doubtful that she would even hold sinus rthyhm given her underlying pulmonary disease. Her rates appear adequately controlled on current dose of BB. Telemetry shows vrates in the 60s-70s. We will continue bisoprolol 10 mg daily as well as Eliquis for anticoagulation, low dose 2.5 mg BID, given age >68  and weight <60 kg.   5. Metastatic Lung Cancer:  Per oncology.   6. COPD: per primary team.   For questions or updates, please contact [place] Please consult [email or web address] for contact info under Cardiology/STEMI.      Signed, Lyda Jester, PA-C  07/04/2018, 11:17 AM

## 2018-07-04 NOTE — Progress Notes (Signed)
Pt's daughter, [name] [phone]), would like to speak with the MDs ASAP in the morning regarding test results and possible discharge to SNF.

## 2018-07-05 ENCOUNTER — Other Ambulatory Visit: Payer: Self-pay | Admitting: Interventional Cardiology

## 2018-07-05 DIAGNOSIS — I10 Essential (primary) hypertension: Secondary | ICD-10-CM

## 2018-07-05 DIAGNOSIS — R600 Localized edema: Secondary | ICD-10-CM

## 2018-07-05 DIAGNOSIS — I340 Nonrheumatic mitral (valve) insufficiency: Secondary | ICD-10-CM

## 2018-07-05 DIAGNOSIS — E876 Hypokalemia: Secondary | ICD-10-CM

## 2018-07-05 LAB — BASIC METABOLIC PANEL
ANION GAP: 9 (ref 5–15)
BUN: 14 mg/dL (ref 8–23)
CALCIUM: 8.7 mg/dL — AB (ref 8.9–10.3)
CO2: 32 mmol/L (ref 22–32)
Chloride: 100 mmol/L (ref 98–111)
Creatinine, Ser: 1 mg/dL (ref 0.44–1.00)
GFR, EST AFRICAN AMERICAN: 55 mL/min — AB (ref >60)
GFR, EST NON AFRICAN AMERICAN: 47 mL/min — AB (ref >60)
Glucose, Bld: 95 mg/dL (ref 70–99)
Potassium: 2.9 mmol/L — ABNORMAL LOW (ref 3.5–5.1)
SODIUM: 141 mmol/L (ref 135–145)

## 2018-07-05 MED ORDER — POTASSIUM CHLORIDE 20 MEQ PO PACK
40.0000 meq | PACK | Freq: Once | ORAL | Status: DC
  Filled 2018-07-05: qty 2

## 2018-07-05 MED ORDER — ADULT MULTIVITAMIN W/MINERALS CH
1.0000 | ORAL_TABLET | Freq: Every day | ORAL | Status: DC
  Administered 2018-07-05 – 2018-07-09 (×5): 1 via ORAL
  Filled 2018-07-05 (×5): qty 1

## 2018-07-05 MED ORDER — POTASSIUM CHLORIDE 20 MEQ PO PACK
40.0000 meq | PACK | ORAL | Status: AC
  Administered 2018-07-05: 40 meq via ORAL
  Filled 2018-07-05 (×2): qty 2

## 2018-07-05 MED ORDER — FUROSEMIDE 10 MG/ML IJ SOLN
80.0000 mg | Freq: Two times a day (BID) | INTRAMUSCULAR | Status: DC
  Administered 2018-07-05 – 2018-07-08 (×6): 80 mg via INTRAVENOUS
  Filled 2018-07-05 (×6): qty 8

## 2018-07-05 NOTE — Progress Notes (Signed)
This is patient baseline RR.

## 2018-07-05 NOTE — Progress Notes (Signed)
Initial Nutrition Assessment  DOCUMENTATION CODES:   Severe malnutrition in context of chronic illness  INTERVENTION:   -Magic Cup BID with meals, each supplement provides 290 kcals and 9 grams protein -MVI with minerals daily  NUTRITION DIAGNOSIS:   Severe Malnutrition related to chronic illness(CHF, COPD) as evidenced by moderate fat depletion, severe fat depletion, moderate muscle depletion, severe muscle depletion, energy intake < 75% for > or equal to 1 month.  GOAL:   Patient will meet greater than or equal to 90% of their needs  MONITOR:   PO intake, Supplement acceptance, Labs, Weight trends, Skin, I & O's  REASON FOR ASSESSMENT:   Consult Assessment of nutrition requirement/status  ASSESSMENT:   Michelle Levine is a 84 y.o. female with a Past Medical History of hypothyroidism; COPD; HTN; pulmonary HTN; CAD; afib; and CHF who presents with LE edema and weeping.   Pt admitted with acute on chronic CHF.   Case discussed with RN prior to visit, who reports pt with fair appetite. She is able to take her medications without difficulty. Pt is a resident of [facility].   Reviewed MAR from SNF; pertinent medications include MVI and MedPass 2.0 BID.   Spoke with pt at bedside, who was sitting in recliner chair at time of visit. Pt reports that she "rarely eats a lot". PTA she has requested "small portions" of meals, due to being overwhelmed by the amount of food served secondary to early satiety. She shares that she usually consumed 50-75% of food provided PTA. She reports that her appetite was poor during hospitalization, but has slowly improved within the past 2 days. Pt consumed about 50% of her lunch per her report. Meal completion records 40-50%.   Pt denies any weight loss. She reports UBW is around 125-130# at baseline. Reviewed wt hx; wt has been stable over the past year, however, suspect fluid retention in lower extremities may be masking true weight loss.    Discussed with pt importance of good meal and supplement intake to promote healing. Pt reports tolerating MedPass supplement well PTA and is amenable to formulary substitution.   Labs reviewed: K: 2.9 (on PO supplementation).   NUTRITION - FOCUSED PHYSICAL EXAM:    Most Recent Value  Orbital Region  Severe depletion  Upper Arm Region  Severe depletion  Thoracic and Lumbar Region  Moderate depletion  Buccal Region  Moderate depletion  Temple Region  Severe depletion  Clavicle Bone Region  Severe depletion  Clavicle and Acromion Bone Region  Severe depletion  Scapular Bone Region  Severe depletion  Dorsal Hand  Moderate depletion  Patellar Region  Mild depletion  Anterior Thigh Region  Mild depletion  Posterior Calf Region  Mild depletion  Edema (RD Assessment)  Moderate  Hair  Reviewed  Eyes  Reviewed  Mouth  Reviewed  Skin  Reviewed  Nails  Reviewed       Diet Order:   Diet Order            Diet 2 gram sodium Room service appropriate? Yes; Fluid consistency: Thin; Fluid restriction: 1200 mL Fluid  Diet effective now              EDUCATION NEEDS:   Education needs have been addressed  Skin:  Skin Assessment: Reviewed RN Assessment  Last BM:  07/05/18  Height:   Ht Readings from Last 1 Encounters:  07/03/18 5\' 5"  (1.651 m)    Weight:   Wt Readings from Last 1 Encounters:  07/05/18 55.2 kg    Ideal Body Weight:  56.8 kg  BMI:  Body mass index is 20.24 kg/m.  Estimated Nutritional Needs:   Kcal:  1400-1600  Protein:  65-80 grams  Fluid:  1.4-1.6 L    Jamarie Mussa A. Jimmye Norman, RD, LDN, CDE Pager: [phone] After hours Pager: [phone]

## 2018-07-05 NOTE — Progress Notes (Addendum)
The patient has been seen in conjunction with Ellen Henri, PA-C. All aspects of care have been considered and discussed. The patient has been personally interviewed, examined, and all clinical data has been reviewed.   Development of LV systolic dysfunction in this setting with known severe comorbidities as well as frailty bode a very poor prognosis.  Increase furosemide to 80 mg twice daily and aggressively replete potassium.  I spoke to her husband her son.  I explained the new development of systolic dysfunction and the negative implications related to prognosis.    He would like for someone to call Michelle Levine [phone]    Progress Note  Patient Name: Michelle Levine Date of Encounter: 07/05/2018  Primary Cardiologist: Kirk Ruths, MD   Subjective   No major complaints. She denies dyspnea and CP. She has a cough with yellow colored sputum, but denies subjective fever and chills. Afebrile.   Inpatient Medications    Scheduled Meds: . apixaban  2.5 mg Oral BID  . bisoprolol  10 mg Oral Daily  . Chlorhexidine Gluconate Cloth  6 each Topical Q0600  . furosemide  60 mg Intravenous BID  . ipratropium-albuterol  3 mL Nebulization TID  . mupirocin ointment  1 application Nasal BID  . potassium chloride  40 mEq Oral Q4H  . sodium chloride flush  3 mL Intravenous Q12H   Continuous Infusions: . sodium chloride     PRN Meds: sodium chloride, acetaminophen, ondansetron (ZOFRAN) IV, sodium chloride flush   Vital Signs    Vitals:   07/05/18 0026 07/05/18 0529 07/05/18 0850 07/05/18 0954  BP: 129/73 129/85  115/71  Pulse: 72 81 81 84  Resp: (!) 21 (!) 31 (!) 22 (!) 21  Temp: 98.6 F (37 C) 98.5 F (36.9 C)  98.3 F (36.8 C)  TempSrc: Oral Oral  Oral  SpO2: 92% 92% 91% 93%  Weight:  55.2 kg    Height:        Intake/Output Summary (Last 24 hours) at 07/05/2018 1015 Last data filed at 07/05/2018 3016 Gross per 24 hour  Intake 600 ml  Output 1500 ml  Net -900  ml   Filed Weights   07/03/18 1457 07/04/18 0547 07/05/18 0529  Weight: 57.9 kg 54.3 kg 55.2 kg    Telemetry    afib w/ rate in the 80s - Personally Reviewed  ECG    Atrial fibrillation 91 bpm, 07/03/18 - Personally Reviewed  Physical Exam   GEN: elderly white female, in no acute distress Neck: No JVD Cardiac: irregularly irregular rhythm, regular rate, 3/6 blowing murmur at the apex  Respiratory: Clear to auscultation bilaterally. GI: Soft, nontender, non-distended  MS: 1-2+ bilateral LEE; No deformity. Neuro:  Nonfocal  Psych: Normal affect   Labs    Chemistry Recent Labs  Lab 07/03/18 1023 07/04/18 0510 07/05/18 0411  NA 141 142 141  K 3.5 3.6 2.9*  CL 100 103 100  CO2 33* 31 32  GLUCOSE 107* 97 95  BUN 17 15 14   CREATININE 0.82 0.90 1.00  CALCIUM 9.0 9.0 8.7*  GFRNONAA 60* 53* 47*  GFRAA >60 >60 55*  ANIONGAP 8 8 9      Hematology Recent Labs  Lab 07/03/18 1023  WBC 7.5  RBC 3.96  HGB 11.9*  HCT 39.1  MCV 98.7  MCH 30.1  MCHC 30.4  RDW 15.9*  PLT 160    Cardiac EnzymesNo results for input(s): TROPONINI in the last 168 hours. No results for input(s): TROPIPOC in  the last 168 hours.   BNP Recent Labs  Lab 07/03/18 1023  BNP 799.7*     DDimer No results for input(s): DDIMER in the last 168 hours.   Radiology    Dg Chest 2 View  Result Date: 07/03/2018 CLINICAL DATA:  Lower extremity edema and wheezing. EXAM: CHEST - 2 VIEW COMPARISON:  04/30/2018.  CT 03/07/2018. FINDINGS: Cardiomegaly with bilateral pulmonary interstitial prominence and bilateral pleural effusions consistent with CHF. Persistent mass lesion right upper lung. Persistent atelectatic changes right upper lung. Similar findings noted on prior exam. No acute bony abnormality. IMPRESSION: 1. Cardiomegaly with bilateral pulmonary interstitial prominence and bilateral pleural effusions consistent CHF. 2. Persistent mass lesion and atelectasis right upper lung. Similar findings noted  on prior exam. Electronically Signed   By: [place]   On: 07/03/2018 12:53    Cardiac Studies   2D Echo 07/04/18 Study Conclusions  - Left ventricle: The cavity size was moderately dilated. Wall   thickness was normal. Systolic function was moderately to   severely reduced. The estimated ejection fraction was in the   range of 30% to 35%. Moderate diffuse hypokinesis. Doppler   parameters are consistent with high ventricular filling pressure.   Internal dimension, ED (PLAX chordal): 56 mm. - Aortic valve: There was mild stenosis. There was moderate   regurgitation. Regurgitation pressure half-time: 390 ms. - Mitral valve: Calcified annulus. Severely thickened leaflets .   Severe diffuse thickening of the anterior leaflet and posterior   leaflet, with severe involvement of chords, consistent with   myxomatous proliferation. Holosystolic prolapse, involving the   posterior leaflet. There was moderate regurgitation visually but   by ERO (0.5cm2) the MR appears severe. Valve area by continuity   equation (using LVOT flow): 0.99 cm^2. Effective regurgitant   orifice (PISA): 0.5 cm^2. Regurgitant volume (PISA): 80 ml. - Left atrium: The atrium was severely dilated. - Right ventricle: Systolic function was moderately to severely   reduced. TAPSE: 10.6 mm . - Right atrium: The atrium was severely dilated. - Tricuspid valve: There was moderate regurgitation. - Pulmonic valve: There was mild regurgitation. - Pulmonary arteries: PA peak pressure: 48 mm Hg (S). - Pericardium, extracardiac: There was a left pleural effusion.  Impressions:  - Moderately dilated LV with moderate LV dysfunction EF 30-35%.   There mitral valve leaflets are severely thickened c/w myxomatous   degeneration involving the chordae tendinae. There is MVP of the   posterior leaflet with at least moderate mitral regurgitation (by   ERO and RF the MR appears severe ). The AV leaflets are   moderately  thickened with moderate AR. There is moderate TR with   PASP 40mmHg c/w moderate pulmonary HTN. There is severe biatrial   enlargement and moderate reduced RVF. COmpared to prior echo, LVF   has declined further (50-55% prior and now 30-35%) and LV   dilation has progressed. MR and AR appear to have progressed.   Recommend TEE for further evaluation of MR and AR. The right   ventricular systolic pressure was increased consistent with   moderate pulmonary hypertension.  Patient Profile     84 yo female with PMH of AI, mitral insufficiency, TR, pulmonary edema, HTN, COPD, metastatic lung Ca, Afib and chronic respiratory failure who presented with bilateral LE edema.Cardiology consulted for evaluation of acute combined systolic and diastolic HF (new LV dysfunction) and persistent atrial fibrillation.   Assessment & Plan    1. Acute combined systolic and diastolic CHF: prior h/o  diastolic dysfunction, but LV dysfunction is new. See echo report above.  Pt admitted for volume overload. Symptomatic with dyspnea upon admit. BNP 800. She is being treated with IV Lasix, at 60 mg BID. 1.7L UOP yesterday. Net I/Os negative 1.3L since admit.  Weight improved from 127>>121 lb. SCr increasing slightly, but remains WNL at 1.0 today. K is low at 2.9 and needs supplementation. BP stable in the 485I systolic.  She notes improvement in breathing. No resting dyspnea. Still volume overloaded with bilateral Lower extremity pitting edema on exam. Continue IV Lasix for diuresis. Will defer to MD dosing. ? Increasing dose to 80 mg BID. Continue strict I/Os, daily weights, low sodium diet and repeat BMP tomorrow to monitor renal function and K.   3. Aortic Insuffiencey/ Mitral Regurgitation:  per echo report 07/04/18, The mitral valve leaflets are severely thickened c/w myxomatous degeneration involving the chordae tendinae. There is MVP of the posterior leaflet with at least moderate mitral regurgitation (by ERO and RF the  MR appears severe ). The AV leaflets are moderately thickened with moderate AR. There is moderate TR with PASP 70mmHg c/w moderate pulmonary HTN. There is severe biatrial enlargement and moderate reduced RVF. COmpared to prior echo, LVF has declined further (50-55% prior and now 30-35%) and LV dilation has progressed. MR and AR appear to have progressed. It has been clear from previous office visits that she is not interested to pursuing valve surgery and this is reasonable given her age and co-morbities including metastatic lung cancer. We will not pursue TEE or further w/u. Suspect this is playing a role in her acute HF symptoms this admission. For now will plan to manage medically with diuretics. Goals of care should be discussed as should consideration of palliative care/hospice.  4. Persistent Atrial Fibrillation: likely contributing to her CHF, however we will continue with rate control strategy + anticoagulation only for now. We would be hesitant to cardiovert given she is extremely frail. Also doubtful that she would even hold sinus rthyhm given her underlying pulmonary disease and severely dilated LA. Her rates appear adequately controlled on current dose of BB. Telemetry shows vrates in the 60s-70s. We will continue bisoprolol 10 mg daily as well as Eliquis for anticoagulation, low dose 2.5 mg BID, given age >11 and weight <60 kg.   5. Metastatic Lung Cancer:  Per oncology.   6. COPD: per primary team.   7. New LV Dysfunction: EF now 30-35% (previously 50-55%). In the setting of severe MV disease and persistent atrial fibrillation. Given her advanced age and co-morbities including metastatic lung cancer, she is not a candidate for intervention or aggressive w/u.     For questions or updates, please contact [place] Please consult [email or web address] for contact info under Cardiology/STEMI.      Signed, Lyda Jester, PA-C  07/05/2018, 10:15 AM

## 2018-07-05 NOTE — Progress Notes (Addendum)
Patients grandaughter (Melissa) called, very upset,verbalized she is very angry because she has  been calling and asking for the MD to call her back for 2 days now.( she is not HCPOA) and nobody called. Dr. Cathlean Sauer  aware.

## 2018-07-05 NOTE — Evaluation (Addendum)
Physical Therapy Evaluation Patient Details Name: Michelle Levine MRN: [id number] DOB: Apr 19, 1922 Today's Date: 07/05/2018   History of Present Illness  84 year old female who presented with lower extremity edema;  past medical history for aortic insufficiency, mitral insufficiency and tricuspid regurgitation with moderate pulmonary hypertension.  Metastatic lung cancer, atrial fibrillation, COPD with chronic hypoxic respiratory failure, L femur fx s/p  IM nail 01/31/18  Clinical Impression  Pt admitted with above diagnosis. Pt currently with functional limitations due to the deficits listed below (see PT Problem List).  Pt able to amb short distances in room, fatigues easily but aware of need for rest breaks (normal for her even at baseline), SPO2=93% on 3L during mobility with PT; Pt should be able to return to independent (or assisted) living at [facility] at d/c (pt states it is ILF, chart states ALF--?), her mobility and activity tolerance are very close to her baseline; will continue to follow in acute setting;      Follow Up Recommendations No PT follow up    Equipment Recommendations  None recommended by PT    Recommendations for Other Services       Precautions / Restrictions Precautions Precautions: Fall Precaution Comments: chronic O2 Restrictions Weight Bearing Restrictions: No      Mobility  Bed Mobility Overal bed mobility: Modified Independent                Transfers Overall transfer level: Needs assistance Equipment used: Rolling walker (2 wheeled) Transfers: Sit to/from Stand Sit to Stand: Min guard         General transfer comment: for safety  Ambulation/Gait Ambulation/Gait assistance: Min guard Gait Distance (Feet): 30 Feet(in room) Assistive device: Rolling walker (2 wheeled) Gait Pattern/deviations: Step-through pattern;Decreased stride length     General Gait Details: min/guard for safety and balance, O2 line management; SpO2 = 93% on  3L; unsteady initially but no overt LOB  Stairs            Wheelchair Mobility    Modified Rankin (Stroke Patients Only)       Balance Overall balance assessment: Needs assistance;History of Falls Sitting-balance support: No upper extremity supported;Feet supported Sitting balance-Leahy Scale: Good       Standing balance-Leahy Scale: Fair Standing balance comment: pt is able to stand and wash hands, comb hair (per her request) without UE support/no LOB                             Pertinent Vitals/Pain Pain Assessment: No/denies pain    Home Living Family/patient expects to be discharged to:: Private residence(independent living at [facility]) Living Arrangements: Spouse/significant other   Type of Home: Apartment Home Access: Level entry     Home Layout: One level Home Equipment: Environmental consultant - 2 wheels;Wheelchair - manual Additional Comments: pt and her spouse propel their w/c's to dining room ~ 2 meals per day    Prior Function Level of Independence: Independent with assistive device(s)         Comments: amb short distances, self propels w/c longer distances     Hand Dominance        Extremity/Trunk Assessment   Upper Extremity Assessment Upper Extremity Assessment: Overall WFL for tasks assessed    Lower Extremity Assessment Lower Extremity Assessment: Overall WFL for tasks assessed       Communication   Communication: No difficulties  Cognition Arousal/Alertness: Awake/alert Behavior During Therapy: WFL for tasks assessed/performed Overall Cognitive Status: Within  Functional Limits for tasks assessed                                        General Comments      Exercises General Exercises - Lower Extremity Ankle Circles/Pumps: AROM;Both;15 reps   Assessment/Plan    PT Assessment Patient needs continued PT services  PT Problem List Decreased activity tolerance;Decreased balance;Decreased  mobility;Cardiopulmonary status limiting activity       PT Treatment Interventions DME instruction;Gait training;Therapeutic activities;Therapeutic exercise;Patient/family education;Functional mobility training    PT Goals (Current goals can be found in the Care Plan section)  Acute Rehab PT Goals Patient Stated Goal: to go home soon and see her husband PT Goal Formulation: With patient Time For Goal Achievement: 07/12/18 Potential to Achieve Goals: Good Additional Goals Additional Goal #1: pt will self propel w/c 60'x2 with maximum of 2 rest breaks, Sats >92% on 2L O2    Frequency Min 3X/week   Barriers to discharge        Co-evaluation               AM-PAC PT "6 Clicks" Daily Activity  Outcome Measure Difficulty turning over in bed (including adjusting bedclothes, sheets and blankets)?: A Little Difficulty moving from lying on back to sitting on the side of the bed? : A Little Difficulty sitting down on and standing up from a chair with arms (e.g., wheelchair, bedside commode, etc,.)?: A Little Help needed moving to and from a bed to chair (including a wheelchair)?: A Little Help needed walking in hospital room?: A Little Help needed climbing 3-5 steps with a railing? : A Little 6 Click Score: 18    End of Session   Activity Tolerance: Patient tolerated treatment well Patient left: in chair;with call bell/phone within reach;with chair alarm set   PT Visit Diagnosis: Unsteadiness on feet (R26.81)    Time: 1610-9604 PT Time Calculation (min) (ACUTE ONLY): 18 min   Charges:   PT Evaluation $PT Eval Low Complexity: 1 Low          Kenyon Ana, PT Pager: [phone] 07/05/2018   [facility] 07/05/2018, 3:42 PM

## 2018-07-05 NOTE — Consult Note (Addendum)
   [facility] CM Inpatient Consult   07/05/2018  Michelle Levine [birth year] [id number]   Patient screened for potential [facility] services. Patient is in the [place] [facility] services under patient's Medicare plan. Patient is from [facility]F and will go to the Skilled nursing facility for short term rehab. No needs identified at this time.    Please place a [facility] Care Management consult or for questions contact:   Natividad Brood, RN BSN [facility] Liaison  [phone] business mobile phone Toll free office [phone]   Update:  PT now recommending to return to ALF.  Natividad Brood, RN BSN [facility] Liaison  [phone] business mobile phone Toll free office [phone]

## 2018-07-05 NOTE — Progress Notes (Signed)
PROGRESS NOTE    Michelle Levine  EGB:[id number] DOB: October 01, 1922 DOA: 07/03/2018 PCP: Crist Infante, MD    Brief Narrative:  84 year old female who presented with lower extremity edema.  She does have significant past medical history for aortic insufficiency, mitral insufficiency and tricuspid regurgitation with moderate pulmonary hypertension.  Metastatic lung cancer, atrial fibrillation, COPD with chronic hypoxic respiratory failure.  Reported worsening lower extremity edema for last 4 weeks, refractory to outpatient therapy with oral furosemide (increased dose, to 80 mg daily).  She presented to the hospital due to persistent symptoms.  On the initial physical examination blood pressure 132/78, heart rate 84, respirate 24, oxygen saturation 92%.  Heart with S1 S2 present, irregularly irregular, 3-6 systolic murmur at the apex.  Lungs with decreased breath sounds bilaterally, diffuse rhonchi and wheezing.  Abdomen soft nontender, positive ++++ lower extremity edema.   Patient was admitted to the hospital with working diagnosis of acute decompensated heart failure   Assessment & Plan:   Principal Problem:   Acute on chronic diastolic heart failure (HCC) Active Problems:   Aortic insufficiency   COPD mixed type (HCC)   Essential hypertension   Bronchogenic cancer of right lung (HCC)   Chronic respiratory failure with hypoxia (HCC)   Bilateral leg edema   A-fib ([place])   1. Acute on chronic systolic heart failure decompensation. Patient responding well to diuresis with furosemide 40 mg IV q12, urine output over last 24 hours 1,701 ml, clinical edema has improved but she is not back to baseline. Continue bisoprolol. Blood pressure systolic 371 to 062 mmHg. Follow up echocardiography with reduction on LV systolic function down to 30 to 35 %, with moderate diffuse hypokinesis.   2. Valvular heart disease with severe mitral regurgitation. Worsening LV systolic function, mitral valve with  myxomatous proliferation, holosystolic prolapse of the posterior leaflet, with severe regurgitation. Continue diuresis and heart failure management, patient is not candidate for invasive procedures due to advance age and fragility.    3. Atrial fibrillation. Rate controlled with bisoprolol, continue anticoagulation with apixaban. Heart rate 83 to 97 bmp.   4. Hypokalemia. Worsening hypokalemia due to IV loop diuretic, will continue correction with Kcl, 80 meq in 2 divided doses.Folow on renal panel in am, renal function continue to be preserved with serum cr at 1,0, will avoid nephrotoxic medications and hypotension.   DVT prophylaxis: apixaban  Code Status: dnr Family Communication: no family at the bedside Disposition Plan/ discharge barriers: pending clinical improvement.    Consultants:   Cardiology   Procedures:     Antimicrobials:        Subjective: Continue to improved dyspnea and lower extremity edema, but not back to baseline, no chest pain or palpitations.   Objective: Vitals:   07/05/18 0850 07/05/18 0954 07/05/18 1105 07/05/18 1238  BP:  115/71 130/85 119/67  Pulse: 81 84 93 83  Resp: (!) 22 (!) 21 (!) 26 (!) 32  Temp:  98.3 F (36.8 C) 98.4 F (36.9 C) 98.1 F (36.7 C)  TempSrc:  Oral Oral Oral  SpO2: 91% 93% 95% 96%  Weight:      Height:        Intake/Output Summary (Last 24 hours) at 07/05/2018 1250 Last data filed at 07/05/2018 6948 Gross per 24 hour  Intake 600 ml  Output 1150 ml  Net -550 ml   Filed Weights   07/03/18 1457 07/04/18 0547 07/05/18 0529  Weight: 57.9 kg 54.3 kg 55.2 kg    Examination:  General: Not in pain or dyspnea, deconditioned  Neurology: Awake and alert, non focal  E ENT: mild pallor, no icterus, oral mucosa moist Cardiovascular: No JVD. S1-S2 present, rhythmic, no gallops, rubs, or murmurs. ++/+++ pitting lower extremity edema. Pulmonary: positive breath sounds bilaterally, decreased air movement at bases, no  wheezing, rhonchi or rales. Gastrointestinal. Abdomen with no organomegaly, non tender, no rebound or guarding Skin. No rashes Musculoskeletal: no joint deformities     Data Reviewed: I have personally reviewed following labs and imaging studies  CBC: Recent Labs  Lab 07/03/18 1023  WBC 7.5  HGB 11.9*  HCT 39.1  MCV 98.7  PLT 355   Basic Metabolic Panel: Recent Labs  Lab 07/03/18 1023 07/04/18 0510 07/05/18 0411  NA 141 142 141  K 3.5 3.6 2.9*  CL 100 103 100  CO2 33* 31 32  GLUCOSE 107* 97 95  BUN 17 15 14   CREATININE 0.82 0.90 1.00  CALCIUM 9.0 9.0 8.7*   GFR: Estimated Creatinine Clearance: 30.6 mL/min (by C-G formula based on SCr of 1 mg/dL). Liver Function Tests: No results for input(s): AST, ALT, ALKPHOS, BILITOT, PROT, ALBUMIN in the last 168 hours. No results for input(s): LIPASE, AMYLASE in the last 168 hours. No results for input(s): AMMONIA in the last 168 hours. Coagulation Profile: No results for input(s): INR, PROTIME in the last 168 hours. Cardiac Enzymes: No results for input(s): CKTOTAL, CKMB, CKMBINDEX, TROPONINI in the last 168 hours. BNP (last 3 results) No results for input(s): PROBNP in the last 8760 hours. HbA1C: No results for input(s): HGBA1C in the last 72 hours. CBG: No results for input(s): GLUCAP in the last 168 hours. Lipid Profile: No results for input(s): CHOL, HDL, LDLCALC, TRIG, CHOLHDL, LDLDIRECT in the last 72 hours. Thyroid Function Tests: No results for input(s): TSH, T4TOTAL, FREET4, T3FREE, THYROIDAB in the last 72 hours. Anemia Panel: No results for input(s): VITAMINB12, FOLATE, FERRITIN, TIBC, IRON, RETICCTPCT in the last 72 hours.    Radiology Studies: I have reviewed all of the imaging during this hospital visit personally     Scheduled Meds: . apixaban  2.5 mg Oral BID  . bisoprolol  10 mg Oral Daily  . Chlorhexidine Gluconate Cloth  6 each Topical Q0600  . furosemide  60 mg Intravenous BID  .  ipratropium-albuterol  3 mL Nebulization TID  . mupirocin ointment  1 application Nasal BID  . potassium chloride  40 mEq Oral Q4H  . sodium chloride flush  3 mL Intravenous Q12H   Continuous Infusions: . sodium chloride       LOS: 2 days        Mauricio Gerome Apley, MD [facility] Pager [phone]

## 2018-07-05 NOTE — Discharge Instructions (Signed)

## 2018-07-06 LAB — MAGNESIUM: Magnesium: 2 mg/dL (ref 1.7–2.4)

## 2018-07-06 LAB — BASIC METABOLIC PANEL
Anion gap: 6 (ref 5–15)
BUN: 15 mg/dL (ref 8–23)
CHLORIDE: 99 mmol/L (ref 98–111)
CO2: 33 mmol/L — AB (ref 22–32)
CREATININE: 0.84 mg/dL (ref 0.44–1.00)
Calcium: 8.8 mg/dL — ABNORMAL LOW (ref 8.9–10.3)
GFR calc Af Amer: >60 mL/min (ref >60)
GFR calc non Af Amer: 58 mL/min — ABNORMAL LOW (ref >60)
GLUCOSE: 110 mg/dL — AB (ref 70–99)
POTASSIUM: 3.3 mmol/L — AB (ref 3.5–5.1)
Sodium: 138 mmol/L (ref 135–145)

## 2018-07-06 MED ORDER — POTASSIUM CHLORIDE CRYS ER 20 MEQ PO TBCR
40.0000 meq | EXTENDED_RELEASE_TABLET | ORAL | Status: AC
  Administered 2018-07-06 (×2): 40 meq via ORAL
  Filled 2018-07-06 (×2): qty 2

## 2018-07-06 NOTE — Progress Notes (Addendum)
The patient has been seen in conjunction with Ellen Henri, PA-C. All aspects of care have been considered and discussed. The patient has been personally interviewed, examined, and all clinical data has been reviewed.   Agree with contents of note.  Continue IV diuresis for an additional dose then convert to oral therapy.  Recommend oral dosing of furosemide starting tomorrow with 80 mg a.m. and 40 mg p.m.  Recommend palliative care to align goals of therapy and provide additional education concerning care at home.  [facility] will sign off.   Medication Recommendations: As noted above. Other recommendations (labs, testing, etc): Palliative care consideration Follow up as an outpatient: As previously scheduled.  Progress Note  Patient Name: Michelle Levine Date of Encounter: 07/06/2018  Primary Cardiologist: Kirk Ruths, MD   Subjective   Out of bed and sitting in chair. No major complaints. Comfortable at rest but notes some mild dyspnea with activities.   Inpatient Medications    Scheduled Meds: . apixaban  2.5 mg Oral BID  . bisoprolol  10 mg Oral Daily  . Chlorhexidine Gluconate Cloth  6 each Topical Q0600  . furosemide  80 mg Intravenous BID  . ipratropium-albuterol  3 mL Nebulization TID  . multivitamin with minerals  1 tablet Oral Daily  . mupirocin ointment  1 application Nasal BID  . potassium chloride  40 mEq Oral Once  . sodium chloride flush  3 mL Intravenous Q12H   Continuous Infusions: . sodium chloride     PRN Meds: sodium chloride, acetaminophen, ondansetron (ZOFRAN) IV, sodium chloride flush   Vital Signs    Vitals:   07/05/18 2020 07/06/18 0453 07/06/18 0755 07/06/18 0841  BP:  127/74 113/69   Pulse:  81 81   Resp:  18 18   Temp:  98.4 F (36.9 C)    TempSrc:  Oral    SpO2: 94% 94% 95% 97%  Weight:  54.5 kg    Height:        Intake/Output Summary (Last 24 hours) at 07/06/2018 1110 Last data filed at 07/06/2018 1015 Gross  per 24 hour  Intake 720 ml  Output 2300 ml  Net -1580 ml   Filed Weights   07/04/18 0547 07/05/18 0529 07/06/18 0453  Weight: 54.3 kg 55.2 kg 54.5 kg    Telemetry    Atrial fibrillation, 80s - Personally Reviewed  ECG    8/27 EKG, atrial fibrillation 91 bpm - Personally Reviewed  Physical Exam   GEN: elderly WF in no acute distress   Neck: No JVD Cardiac: irregularly irregular rhythm, regular rate, 3/6 blowing murmur at the apex/ right axilla  Respiratory: bilateral diffuse rhonchi GI: Soft, nontender, non-distended  MS: 1+ pitting edema on the left, trace edema on the right No deformity. Neuro:  Nonfocal  Psych: Normal affect   Labs    Chemistry Recent Labs  Lab 07/04/18 0510 07/05/18 0411 07/06/18 0431  NA 142 141 138  K 3.6 2.9* 3.3*  CL 103 100 99  CO2 31 32 33*  GLUCOSE 97 95 110*  BUN 15 14 15   CREATININE 0.90 1.00 0.84  CALCIUM 9.0 8.7* 8.8*  GFRNONAA 53* 47* 58*  GFRAA >60 55* >60  ANIONGAP 8 9 6      Hematology Recent Labs  Lab 07/03/18 1023  WBC 7.5  RBC 3.96  HGB 11.9*  HCT 39.1  MCV 98.7  MCH 30.1  MCHC 30.4  RDW 15.9*  PLT 160    Cardiac EnzymesNo  results for input(s): TROPONINI in the last 168 hours. No results for input(s): TROPIPOC in the last 168 hours.   BNP Recent Labs  Lab 07/03/18 1023  BNP 799.7*     DDimer No results for input(s): DDIMER in the last 168 hours.   Radiology    No results found.  Cardiac Studies   2D Echo 07/04/18 Study Conclusions  - Left ventricle: The cavity size was moderately dilated. Wall thickness was normal. Systolic function was moderately to severely reduced. The estimated ejection fraction was in the range of 30% to 35%. Moderate diffuse hypokinesis. Doppler parameters are consistent with high ventricular filling pressure. Internal dimension, ED (PLAX chordal): 56 mm. - Aortic valve: There was mild stenosis. There was moderate regurgitation. Regurgitation pressure  half-time: 390 ms. - Mitral valve: Calcified annulus. Severely thickened leaflets . Severe diffuse thickening of the anterior leaflet and posterior leaflet, with severe involvement of chords, consistent with myxomatous proliferation. Holosystolic prolapse, involving the posterior leaflet. There was moderate regurgitation visually but by ERO (0.5cm2) the MR appears severe. Valve area by continuity equation (using LVOT flow): 0.99 cm^2. Effective regurgitant orifice (PISA): 0.5 cm^2. Regurgitant volume (PISA): 80 ml. - Left atrium: The atrium was severely dilated. - Right ventricle: Systolic function was moderately to severely reduced. TAPSE: 10.6 mm . - Right atrium: The atrium was severely dilated. - Tricuspid valve: There was moderate regurgitation. - Pulmonic valve: There was mild regurgitation. - Pulmonary arteries: PA peak pressure: 48 mm Hg (S). - Pericardium, extracardiac: There was a left pleural effusion.  Impressions:  - Moderately dilated LV with moderate LV dysfunction EF 30-35%. There mitral valve leaflets are severely thickened c/w myxomatous degeneration involving the chordae tendinae. There is MVP of the posterior leaflet with at least moderate mitral regurgitation (by ERO and RF the MR appears severe ). The AV leaflets are moderately thickened with moderate AR. There is moderate TR with PASP 72mmHg c/w moderate pulmonary HTN. There is severe biatrial enlargement and moderate reduced RVF. COmpared to prior echo, LVF has declined further (50-55% prior and now 30-35%) and LV dilation has progressed. MR and AR appear to have progressed. Recommend TEE for further evaluation of MR and AR. The right ventricular systolic pressure was increased consistent with moderate pulmonary hypertension.  Patient Profile     84 yo female with PMH of AI, mitral insufficiency, TR, pulmonary edema, HTN, COPD, metastatic lung Ca, Afib and chronic  respiratory failure who presented with bilateral LE edema.Cardiology consulted for evaluation of acute combined systolic and diastolic HF (new LV dysfunction) and persistent atrial fibrillation.  Assessment & Plan    1. Acute combined systolic and diastolic CHF: prior h/o diastolic dysfunction, but LV dysfunction is new. See echo report above.  Pt admitted for volume overload. Symptomatic with dyspnea upon admit.BNP 800. --- She is being treated with IV Lasix w/ dose increase to 80 mg BID on 07/05/18 --- 1.6L out yesterday in [place]. Net I/Os -2.9 L since admit --- Weight continues to improve, down from admit weight of 127 >>120 lb today.  --- Volume improving but still with 1+ LE pitting edema, L>R --- BP and renal function stable. Continue diuresis w/ Lasix --- Give supplemental K for hypokalemia --- Continue strict I/Os, daily weights and daily BMPs --- Low sodium diet   3. Aortic Insuffiencey/ Mitral Regurgitation:per echo report 07/04/18, The mitral valve leaflets are severely thickened c/w myxomatous degeneration involving the chordae tendinae. There is MVP of the posterior leaflet with at least moderate  mitral regurgitation (by ERO and RF the MR appears severe ). The AV leaflets are moderately thickened with moderate AR. There is moderate TR withPASP 21mmHg c/w moderate pulmonary HTN. There is severe biatrial enlargement and moderate reduced RVF. COmpared to prior echo, LVF has declined further (50-55% prior and now 30-35%) and LV dilation has progressed. MR and AR appear to have progressed.  ---- It has been clear from previous office visits that she is not interested to pursuing valve surgery and this is reasonable given her age and co-morbitiesincluding metastatic lung cancer. We will not pursue TEE or further w/u.  --- Suspect this is playing a role in her acute HF symptoms this admission. For now will plan to manage medically with diuretics. --- Goals of care should be discussed as  should consideration of palliative care/hospice.  4. Persistent Atrial Fibrillation: likely contributing to her CHF, however we will continue with rate control strategy + anticoagulation only for now. We would be hesitant to cardiovert given she is extremely frail. Also doubtful that she would even hold sinus rthyhm given her underlying pulmonary disease and severely dilated LA.  --- Her rates appear adequately controlled on current dose of BB.Telemetry shows vrates in the 60s-70s. --- We will continue bisoprolol 10 mg daily as well as Eliquis for anticoagulation, low dose 2.5 mg BID, given age >85 and weight <60 kg.  --- keep electrolytes stable. Give supplemental K for hypokalemia  5. Metastatic Lung Cancer:Per oncology.   6. COPD:per primary team.   7. New LV Dysfunction: EF now 30-35% (previously 50-55%). In the setting of severe MV disease and persistent atrial fibrillation. Given her advanced age and co-morbitiesincluding metastatic lung cancer, she is not a candidate for intervention or aggressive w/u.    Recommend palliative care consultation.   For questions or updates, please contact [place] Please consult [email or web address] for contact info under Cardiology/STEMI.      Signed, Lyda Jester, PA-C  07/06/2018, 11:10 AM

## 2018-07-06 NOTE — Progress Notes (Signed)
This Rn spoke with grandaughter who is very mad/upset because we are not giving her any information about the patient. CN explained to the patient's grandaughter Michelle Levine)  that by law we cannot disclose any information to anybody About our patient because it is  HIPPA violation. Michelle Levine is not the HCPOA.  CN called Michelle Levine,Michelle Levine,who is HCPOA (patients son), if he will give [place] permission to give any information to Michelle Levine pertaining to patient's hospital information.and he said "Yes, it's okey".

## 2018-07-06 NOTE — Progress Notes (Signed)
PROGRESS NOTE [facility]   Michelle Levine   WIO:[id number] DOB: 1922/11/01  DOA: 07/03/2018 PCP: Crist Infante, MD   Brief Narrative:  Michelle Levine is a 84 year old female who presented with lower extremity edema. She does have significant past medical history for aortic insufficiency, mitral insufficiency and tricuspid regurgitation with moderate pulmonary hypertension. Metastatic lung cancer, atrial fibrillation, COPD with chronic hypoxic respiratory failure.Reported worsening lower extremity edema for last 4 weeks, refractory to outpatient therapy with oral furosemide (increased dose,to 80 mg daily).She presented to the hospital due to persistent symptoms. On the initial physical examination blood pressure 132/78, heart rate 84, respirate 24, oxygen saturation 92%. Heart withS1 S2 present, irregularly irregular, 3-6 systolic murmur at the apex. Lungs with decreased breath sounds bilaterally, diffuse rhonchi and wheezing. Abdomen soft nontender, positive ++++lower extremity edema.   Patient was admitted to the hospital with working diagnosis of acute decompensated heart failure  Subjective: Patient seen and examined, she report her breathing is about baseline, however continues with significant leg swelling.   Assessment & Plan:   Principal Problem:   Acute on chronic diastolic heart failure (HCC) Active Problems:   Aortic insufficiency   COPD mixed type (HCC)   Essential hypertension   Bronchogenic cancer of right lung (HCC)   Chronic respiratory failure with hypoxia (HCC)   Bilateral leg edema   A-fib ([place])  1. Acute on chronic systolic heart failure decompensation. Patient continues to responding well to diuresis, she is net negative ~ 3.2L. She remains in 2L [place]. She continues with signs of fluid overload. Lungs still with crackles up to 1/3 fields, 2+ pitting edema.  We will continue IV Lasix today, reevaluate in a.m.  Monitor renal function while on  IV diuretics. Will add TED hose.  Continue bisoprolol.  Echocardiogram shows reduction in LVEF down to 30 to 35% with moderate diffuse hypokinesis.  Per cardiology no further ischemic work-up at this time.   2. Valvular heart disease with severe mitral regurgitation. Mitral valve with myxomatous proliferation, holosystolic prolapse of the posterior leaflet and severe regurgitation.  Continue diuresis and heart failure management.  Patient not candidate for invasive procedure at this time due to advanced age  58. Atrial fibrillation.  Rate controlled with beta-blocker and anticoagulation with Eliquis.  Stable  4. Hypokalemia. Due to IV diuresis, continue to replete as needed.  Check renal function and magnesium in a.m.  Serum creatinine continues to be stable.  DVT prophylaxis: Eliquis Code Status: DNR Family Communication: None at bedside Disposition Plan: Home in 1 to 2 days if continues to improve.  Consultants:   Cardiology  Procedures:     Antimicrobials:   Objective: Vitals:   07/05/18 2020 07/06/18 0453 07/06/18 0755 07/06/18 0841  BP:  127/74 113/69   Pulse:  81 81   Resp:  18 18   Temp:  98.4 F (36.9 C)    TempSrc:  Oral    SpO2: 94% 94% 95% 97%  Weight:  54.5 kg    Height:        Intake/Output Summary (Last 24 hours) at 07/06/2018 1553 Last data filed at 07/06/2018 1309 Gross per 24 hour  Intake 840 ml  Output 2500 ml  Net -1660 ml   Filed Weights   07/04/18 0547 07/05/18 0529 07/06/18 0453  Weight: 54.3 kg 55.2 kg 54.5 kg    Examination:  General exam: Appears calm and comfortable  Respiratory system: Decreased breath sounds bilaterally, bibasilar crackles up to 1/3 fields.  No  wheezing Cardiovascular system: S1-S2, irregularly irregular, 3/6 systolic murmur best heard at the apex.  No JVD Gastrointestinal system: Abdomen is nondistended, soft and nontender.  Central nervous system: Alert and oriented. No focal neurological deficits. Extremities:  2+ pitting edema on the left, 1+ pitting edema on the right Skin: No rashes.  Chronic venous stasis changes on bilateral lower extremities. Psychiatry:  Mood & affect appropriate.    Data Reviewed: I have personally reviewed following labs and imaging studies  CBC: Recent Labs  Lab 07/03/18 1023  WBC 7.5  HGB 11.9*  HCT 39.1  MCV 98.7  PLT 275   Basic Metabolic Panel: Recent Labs  Lab 07/03/18 1023 07/04/18 0510 07/05/18 0411 07/06/18 0431  NA 141 142 141 138  K 3.5 3.6 2.9* 3.3*  CL 100 103 100 99  CO2 33* 31 32 33*  GLUCOSE 107* 97 95 110*  BUN 17 15 14 15   CREATININE 0.82 0.90 1.00 0.84  CALCIUM 9.0 9.0 8.7* 8.8*  MG  --   --   --  2.0   GFR: Estimated Creatinine Clearance: 36 mL/min (by C-G formula based on SCr of 0.84 mg/dL). Liver Function Tests: No results for input(s): AST, ALT, ALKPHOS, BILITOT, PROT, ALBUMIN in the last 168 hours. No results for input(s): LIPASE, AMYLASE in the last 168 hours. No results for input(s): AMMONIA in the last 168 hours. Coagulation Profile: No results for input(s): INR, PROTIME in the last 168 hours. Cardiac Enzymes: No results for input(s): CKTOTAL, CKMB, CKMBINDEX, TROPONINI in the last 168 hours. BNP (last 3 results) No results for input(s): PROBNP in the last 8760 hours. HbA1C: No results for input(s): HGBA1C in the last 72 hours. CBG: No results for input(s): GLUCAP in the last 168 hours. Lipid Profile: No results for input(s): CHOL, HDL, LDLCALC, TRIG, CHOLHDL, LDLDIRECT in the last 72 hours. Thyroid Function Tests: No results for input(s): TSH, T4TOTAL, FREET4, T3FREE, THYROIDAB in the last 72 hours. Anemia Panel: No results for input(s): VITAMINB12, FOLATE, FERRITIN, TIBC, IRON, RETICCTPCT in the last 72 hours. Sepsis Labs: No results for input(s): PROCALCITON, LATICACIDVEN in the last 168 hours.  Recent Results (from the past 240 hour(s))  MRSA PCR Screening     Status: Abnormal   Collection Time: 07/03/18   6:55 PM  Result Value Ref Range Status   MRSA by PCR POSITIVE (A) NEGATIVE Final    Comment:        The GeneXpert MRSA Assay (FDA approved for NASAL specimens only), is one component of a comprehensive MRSA colonization surveillance program. It is not intended to diagnose MRSA infection nor to guide or monitor treatment for MRSA infections. RESULT CALLED TO, READ BACK BY AND VERIFIED WITH: K.WALL,RN AT 2146 BY L.PITT 07/03/18       Radiology Studies: No results found.    Scheduled Meds: . apixaban  2.5 mg Oral BID  . bisoprolol  10 mg Oral Daily  . Chlorhexidine Gluconate Cloth  6 each Topical Q0600  . furosemide  80 mg Intravenous BID  . ipratropium-albuterol  3 mL Nebulization TID  . multivitamin with minerals  1 tablet Oral Daily  . mupirocin ointment  1 application Nasal BID  . potassium chloride  40 mEq Oral Q3H  . sodium chloride flush  3 mL Intravenous Q12H   Continuous Infusions: . sodium chloride       LOS: 3 days    Time spent: Total of 35 minutes spent with pt, greater than 50% of which was  spent in discussion of  treatment, counseling and coordination of care   Chipper Oman, MD Pager: Text Page via [email or web address]   If 7PM-7AM, please contact night-coverage [email or web address] 07/06/2018, 3:53 PM   Note - This record has been created using [employer]. Chart creation errors have been sought, but may not always have been located. Such creation errors do not reflect on the standard of medical care.

## 2018-07-07 DIAGNOSIS — I5023 Acute on chronic systolic (congestive) heart failure: Secondary | ICD-10-CM

## 2018-07-07 DIAGNOSIS — J9611 Chronic respiratory failure with hypoxia: Secondary | ICD-10-CM

## 2018-07-07 LAB — BASIC METABOLIC PANEL
Anion gap: 9 (ref 5–15)
BUN: 14 mg/dL (ref 8–23)
CHLORIDE: 101 mmol/L (ref 98–111)
CO2: 30 mmol/L (ref 22–32)
CREATININE: 0.73 mg/dL (ref 0.44–1.00)
Calcium: 8.8 mg/dL — ABNORMAL LOW (ref 8.9–10.3)
GFR calc Af Amer: >60 mL/min (ref >60)
GFR calc non Af Amer: >60 mL/min (ref >60)
Glucose, Bld: 107 mg/dL — ABNORMAL HIGH (ref 70–99)
Potassium: 3.7 mmol/L (ref 3.5–5.1)
SODIUM: 140 mmol/L (ref 135–145)

## 2018-07-07 LAB — MAGNESIUM: Magnesium: 2 mg/dL (ref 1.7–2.4)

## 2018-07-07 MED ORDER — TRAZODONE HCL 50 MG PO TABS
50.0000 mg | ORAL_TABLET | Freq: Every day | ORAL | Status: DC
  Administered 2018-07-07 – 2018-07-08 (×2): 50 mg via ORAL
  Filled 2018-07-07 (×2): qty 1

## 2018-07-07 MED ORDER — BISOPROLOL FUMARATE 5 MG PO TABS: 5.0000 mg | ORAL_TABLET | Freq: Every day | ORAL | Status: DC

## 2018-07-07 NOTE — Progress Notes (Signed)
PROGRESS NOTE    Michelle Levine  YPP:[id number] DOB: 12/16/1921 DOA: 07/03/2018 PCP: Crist Infante, MD     Brief Narrative:  84 year old woman admitted from skilled nursing facility on 8/27 with complaints of lower extremity edema.  She has history of aortic insufficiency, mitral insufficiency and tricuspid regurgitation with moderate pulmonary hypertension.  Also history of metastatic lung cancer, COPD with chronic hypoxic respiratory failure.  Her outpatient Lasix dose has been increased to 80 mg daily but despite this she noted worsening lower extremity edema for 4 weeks prior to admission.   Assessment & Plan:   Principal Problem:   Acute on chronic diastolic heart failure (HCC) Active Problems:   Aortic insufficiency   COPD mixed type (HCC)   Essential hypertension   Bronchogenic cancer of right lung (HCC)   Chronic respiratory failure with hypoxia (HCC)   Bilateral leg edema   A-fib (HCC)   Acute on chronic systolic heart failure decompensation -2D echocardiogram from this admission shows an ejection fraction of 30 to 35% with diffuse hypokinesis, moderate aortic valve regurgitation, moderate mitral valve regurgitation, moderate tricuspid valve regurgitation. -Cardiology has been following. -She remains on Lasix 80 mg IV twice daily and has diuresed a total of 4.1 L, is 2500 cc negative overnight. -She remains volume overloaded on exam. -Cardiology planning on continuing IV Lasix today and transitioning to p.o. in the morning.  They recommend dose of Lasix at 80 mg in the morning and 40 mg at night.  I would consider potentially watching her another 24 hours after we transition diuretics to p.o. to ensure adequate diuresis, with potential discharge home on Monday. -Continue bisoprolol. -Palliative care discussions need to continue given advanced age and patient's wishes to not pursue valve replacement surgery.  Atrial fibrillation -Rate controlled, will resume  beta-blocker. -She is chronically anticoagulated on Eliquis.  Hypokalemia -Due to diuresis, potassium is 3.7 today.   DVT prophylaxis: Eliquis Code Status: DNR Family Communication: Granddaughter at bedside updated on plan of care and all questions answered Disposition Plan: Back to SNF once medically stable, anticipate 48 hours  Consultants:   Cardiology  Procedures:   None  Antimicrobials:  Anti-infectives (From admission, onward)   None       Subjective: Sitting in chair at bedside, denies shortness of breath, states her lower extremity edema significantly improved.  No chest pain  Objective: Vitals:   07/06/18 2038 07/07/18 0412 07/07/18 0707 07/07/18 0750  BP:  125/79    Pulse:  76    Resp:  18    Temp:  98.6 F (37 C)    TempSrc:  Oral    SpO2: 94% 94%  98%  Weight:   53.8 kg   Height:        Intake/Output Summary (Last 24 hours) at 07/07/2018 1212 Last data filed at 07/07/2018 0909 Gross per 24 hour  Intake 960 ml  Output 1300 ml  Net -340 ml   Filed Weights   07/05/18 0529 07/06/18 0453 07/07/18 0707  Weight: 55.2 kg 54.5 kg 53.8 kg    Examination:  General exam: Alert, awake, oriented x 3 Respiratory system: Clear to auscultation. Respiratory effort normal. Cardiovascular system:RRR. No murmurs, rubs, gallops. Gastrointestinal system: Abdomen is nondistended, soft and nontender. No organomegaly or masses felt. Normal bowel sounds heard. Central nervous system: Alert and oriented. No focal neurological deficits. Extremities: 1+ pitting edema bilaterally, +pedal pulses Skin: No rashes, lesions or ulcers Psychiatry: Judgement and insight appear normal. Mood & affect appropriate.  Data Reviewed: I have personally reviewed following labs and imaging studies  CBC: Recent Labs  Lab 07/03/18 1023  WBC 7.5  HGB 11.9*  HCT 39.1  MCV 98.7  PLT 390   Basic Metabolic Panel: Recent Labs  Lab 07/03/18 1023 07/04/18 0510 07/05/18 0411  07/06/18 0431 07/07/18 0529  NA 141 142 141 138 140  K 3.5 3.6 2.9* 3.3* 3.7  CL 100 103 100 99 101  CO2 33* 31 32 33* 30  GLUCOSE 107* 97 95 110* 107*  BUN 17 15 14 15 14   CREATININE 0.82 0.90 1.00 0.84 0.73  CALCIUM 9.0 9.0 8.7* 8.8* 8.8*  MG  --   --   --  2.0 2.0   GFR: Estimated Creatinine Clearance: 37.3 mL/min (by C-G formula based on SCr of 0.73 mg/dL). Liver Function Tests: No results for input(s): AST, ALT, ALKPHOS, BILITOT, PROT, ALBUMIN in the last 168 hours. No results for input(s): LIPASE, AMYLASE in the last 168 hours. No results for input(s): AMMONIA in the last 168 hours. Coagulation Profile: No results for input(s): INR, PROTIME in the last 168 hours. Cardiac Enzymes: No results for input(s): CKTOTAL, CKMB, CKMBINDEX, TROPONINI in the last 168 hours. BNP (last 3 results) No results for input(s): PROBNP in the last 8760 hours. HbA1C: No results for input(s): HGBA1C in the last 72 hours. CBG: No results for input(s): GLUCAP in the last 168 hours. Lipid Profile: No results for input(s): CHOL, HDL, LDLCALC, TRIG, CHOLHDL, LDLDIRECT in the last 72 hours. Thyroid Function Tests: No results for input(s): TSH, T4TOTAL, FREET4, T3FREE, THYROIDAB in the last 72 hours. Anemia Panel: No results for input(s): VITAMINB12, FOLATE, FERRITIN, TIBC, IRON, RETICCTPCT in the last 72 hours. Urine analysis: No results found for: COLORURINE, APPEARANCEUR, LABSPEC, [place], GLUCOSEU, HGBUR, BILIRUBINUR, [place], PROTEINUR, UROBILINOGEN, NITRITE, LEUKOCYTESUR Sepsis Labs: @LABRCNTIP (procalcitonin:4,lacticidven:4)  ) Recent Results (from the past 240 hour(s))  MRSA PCR Screening     Status: Abnormal   Collection Time: 07/03/18  6:55 PM  Result Value Ref Range Status   MRSA by PCR POSITIVE (A) NEGATIVE Final    Comment:        The GeneXpert MRSA Assay (FDA approved for NASAL specimens only), is one component of a comprehensive MRSA colonization surveillance program. It is  not intended to diagnose MRSA infection nor to guide or monitor treatment for MRSA infections. RESULT CALLED TO, READ BACK BY AND VERIFIED WITH: K.WALL,RN AT 2146 BY L.PITT 07/03/18          Radiology Studies: No results found.      Scheduled Meds: . apixaban  2.5 mg Oral BID  . bisoprolol  10 mg Oral Daily  . Chlorhexidine Gluconate Cloth  6 each Topical Q0600  . furosemide  80 mg Intravenous BID  . ipratropium-albuterol  3 mL Nebulization TID  . multivitamin with minerals  1 tablet Oral Daily  . mupirocin ointment  1 application Nasal BID  . sodium chloride flush  3 mL Intravenous Q12H   Continuous Infusions: . sodium chloride       LOS: 4 days    Time spent: 35 minutes. Greater than 50% of this time was spent in direct contact with the patient and with patient's granddaughter, coordinating care and discussing relevant ongoing clinical issues, including plans for continued IV diuresis and potential discharge home in 48 hours.     Lelon Frohlich, MD [facility] Pager [phone]  If 7PM-7AM, please contact night-coverage [email or web address] Password [email or web address] 07/07/2018, 12:12 PM

## 2018-07-07 NOTE — Plan of Care (Signed)
  Problem: Education: Goal: Knowledge of General Education information will improve Description Including pain rating scale, medication(s)/side effects and non-pharmacologic comfort measures Outcome: Progressing   Problem: Health Behavior/Discharge Planning: Goal: Ability to manage health-related needs will improve Outcome: Progressing   Problem: Clinical Measurements: Goal: Will remain free from infection Outcome: Progressing   Problem: Activity: Goal: Risk for activity intolerance will decrease Outcome: Progressing   Problem: Nutrition: Goal: Adequate nutrition will be maintained Outcome: Progressing   Problem: Elimination: Goal: Will not experience complications related to bowel motility Outcome: Progressing   Problem: Safety: Goal: Ability to remain free from injury will improve Outcome: Progressing   Problem: Skin Integrity: Goal: Risk for impaired skin integrity will decrease Outcome: Progressing

## 2018-07-08 DIAGNOSIS — E43 Unspecified severe protein-calorie malnutrition: Secondary | ICD-10-CM

## 2018-07-08 DIAGNOSIS — C3491 Malignant neoplasm of unspecified part of right bronchus or lung: Secondary | ICD-10-CM

## 2018-07-08 LAB — BASIC METABOLIC PANEL
Anion gap: 8 (ref 5–15)
BUN: 15 mg/dL (ref 8–23)
CHLORIDE: 100 mmol/L (ref 98–111)
CO2: 31 mmol/L (ref 22–32)
Calcium: 8.6 mg/dL — ABNORMAL LOW (ref 8.9–10.3)
Creatinine, Ser: 0.76 mg/dL (ref 0.44–1.00)
GFR calc non Af Amer: >60 mL/min (ref >60)
GLUCOSE: 100 mg/dL — AB (ref 70–99)
Potassium: 3.1 mmol/L — ABNORMAL LOW (ref 3.5–5.1)
Sodium: 139 mmol/L (ref 135–145)

## 2018-07-08 MED ORDER — POTASSIUM CHLORIDE 20 MEQ PO PACK
40.0000 meq | PACK | ORAL | Status: AC
  Administered 2018-07-08 (×2): 40 meq via ORAL
  Filled 2018-07-08 (×2): qty 2

## 2018-07-08 MED ORDER — FUROSEMIDE 80 MG PO TABS
80.0000 mg | ORAL_TABLET | Freq: Every day | ORAL | Status: DC
  Administered 2018-07-09: 80 mg via ORAL
  Filled 2018-07-08: qty 1

## 2018-07-08 NOTE — Progress Notes (Signed)
PROGRESS NOTE    MIRCA YALE  WCB:[id number] DOB: 1922/06/24 DOA: 07/03/2018 PCP: Crist Infante, MD    Brief Narrative:  84 year old female who presented with lower extremity edema. She does have significant past medical history for aortic insufficiency, mitral insufficiency and tricuspid regurgitation with moderate pulmonary hypertension. Metastatic lung cancer, atrial fibrillation, COPD with chronic hypoxic respiratory failure.Reported worsening lower extremity edema for last 4 weeks, refractory to outpatient therapy with oral furosemide (increased dose,to 80 mg daily).She presented to the hospital due to persistent symptoms. On the initial physical examination blood pressure 132/78, heart rate 84, respiratory rate 24, oxygen saturation 92%. Heart withS1 S2 present, irregularly irregular, 3-6 systolic murmur at the apex. Lungs with decreased breath sounds bilaterally, diffuse rhonchi and wheezing. Abdomen soft nontender, positive ++++lower extremity edema.  Sodium 141, potassium 3.5, chloride 100, bicarb 33, glucose 107, BUN 17, creatinine 0.82, BNP 799, white count 7.5, hemoglobin 9.9, hematocrit 39.1, platelets 160.  Chest x-ray with vascular congestion, bilateral pleural effusions more right and left.  EKG with atrial fibrillation and positive PVC.   Patient was admitted to the hospital with working diagnosis of acute decompensated heart failure   Assessment & Plan:   Principal Problem:   Acute on chronic diastolic heart failure (HCC) Active Problems:   Aortic insufficiency   COPD mixed type (HCC)   Essential hypertension   Bronchogenic cancer of right lung (HCC)   Chronic respiratory failure with hypoxia (HCC)   Bilateral leg edema   A-fib (HCC)   Protein-calorie malnutrition, severe  1. Acute on chronic systolic heart failure decompensation, LV systolic function down to 30 to 35 %, with moderate diffuse hypokinesis.  Patient with improved volume status, will  transition to oral furosemide 80 mg in am and 40 mg in pm. Last 24 hours urine output has been 800 cc, blood pressure 104 to 616 mmHg systolic.   2. Valvular heart disease with severe mitral regurgitation.Mitral valve with myxomatous proliferation, holosystolic prolapse of the posterior leaflet, with severe regurgitation. Will continue medical therapy with furosemide, target negative fluid balance. Not candidate for surgical intervention.   3. Atrial fibrillation. Continue rate controlled with bisoprolol, heart rate in the 70's. Anticoagulation with apixaban.   4. Hypokalemia due to aggressive diuresis with loop diuretics, will continue k correction and transition to po furosemide, follow on renal panel in am. Today Kcl 80 meq in 2 divided doses today.   DVT prophylaxis:apixaban Code Status:dnr Family Communication:no family at the bedside Disposition Plan/ discharge barriers:pending clinical improvement, plan for dc 09/02.   Consultants:  Cardiology  Procedures:    Antimicrobials    Subjective: Patient continue to improve dyspnea and edema but no back to baseline yet. No nausea or vomiting, no chest pain. Continue to be very weak and deconditioned, uses supplemental 02 per [place] at home (assisted living).   Objective: Vitals:   07/07/18 2033 07/07/18 2125 07/08/18 0602 07/08/18 0854  BP:  (!) 104/57 123/77   Pulse:  70 71   Resp:  18 18   Temp:  98.7 F (37.1 C) 98.8 F (37.1 C)   TempSrc:  Oral Oral   SpO2: 93% 93% 94% 94%  Weight:   53.4 kg   Height:        Intake/Output Summary (Last 24 hours) at 07/08/2018 1013 Last data filed at 07/08/2018 0842 Gross per 24 hour  Intake 1080 ml  Output 1100 ml  Net -20 ml   Filed Weights   07/06/18 0453 07/07/18 0707 07/08/18  0602  Weight: 54.5 kg 53.8 kg 53.4 kg    Examination:   General: deconditioned, no in pain or dyspnea.  Neurology: Awake and alert, non focal  E ENT: mild pallor, no icterus, oral  mucosa moist Cardiovascular: No JVD. S1-S2 present, rhythmic, no gallops, rubs, or murmurs. +/++ bilateral lower extremity edema. Pulmonary: decreased breath sounds bilaterally at bases, no wheezing, rhonchi or rales. Gastrointestinal. Abdomen with no organomegaly, non tender, no rebound or guarding Skin. No rashes Musculoskeletal: no joint deformities     Data Reviewed: I have personally reviewed following labs and imaging studies  CBC: Recent Labs  Lab 07/03/18 1023  WBC 7.5  HGB 11.9*  HCT 39.1  MCV 98.7  PLT 875   Basic Metabolic Panel: Recent Labs  Lab 07/04/18 0510 07/05/18 0411 07/06/18 0431 07/07/18 0529 07/08/18 0718  NA 142 141 138 140 139  K 3.6 2.9* 3.3* 3.7 3.1*  CL 103 100 99 101 100  CO2 31 32 33* 30 31  GLUCOSE 97 95 110* 107* 100*  BUN 15 14 15 14 15   CREATININE 0.90 1.00 0.84 0.73 0.76  CALCIUM 9.0 8.7* 8.8* 8.8* 8.6*  MG  --   --  2.0 2.0  --    GFR: Estimated Creatinine Clearance: 37 mL/min (by C-G formula based on SCr of 0.76 mg/dL). Liver Function Tests: No results for input(s): AST, ALT, ALKPHOS, BILITOT, PROT, ALBUMIN in the last 168 hours. No results for input(s): LIPASE, AMYLASE in the last 168 hours. No results for input(s): AMMONIA in the last 168 hours. Coagulation Profile: No results for input(s): INR, PROTIME in the last 168 hours. Cardiac Enzymes: No results for input(s): CKTOTAL, CKMB, CKMBINDEX, TROPONINI in the last 168 hours. BNP (last 3 results) No results for input(s): PROBNP in the last 8760 hours. HbA1C: No results for input(s): HGBA1C in the last 72 hours. CBG: No results for input(s): GLUCAP in the last 168 hours. Lipid Profile: No results for input(s): CHOL, HDL, LDLCALC, TRIG, CHOLHDL, LDLDIRECT in the last 72 hours. Thyroid Function Tests: No results for input(s): TSH, T4TOTAL, FREET4, T3FREE, THYROIDAB in the last 72 hours. Anemia Panel: No results for input(s): VITAMINB12, FOLATE, FERRITIN, TIBC, IRON,  RETICCTPCT in the last 72 hours.    Radiology Studies: I have reviewed all of the imaging during this hospital visit personally     Scheduled Meds: . apixaban  2.5 mg Oral BID  . bisoprolol  10 mg Oral Daily  . Chlorhexidine Gluconate Cloth  6 each Topical Q0600  . furosemide  80 mg Intravenous BID  . ipratropium-albuterol  3 mL Nebulization TID  . multivitamin with minerals  1 tablet Oral Daily  . mupirocin ointment  1 application Nasal BID  . potassium chloride  40 mEq Oral Q4H  . sodium chloride flush  3 mL Intravenous Q12H  . traZODone  50 mg Oral QHS   Continuous Infusions: . sodium chloride       LOS: 5 days        Mauricio Gerome Apley, MD [facility] Pager [phone]

## 2018-07-09 LAB — BASIC METABOLIC PANEL
Anion gap: 6 (ref 5–15)
BUN: 17 mg/dL (ref 8–23)
CHLORIDE: 101 mmol/L (ref 98–111)
CO2: 32 mmol/L (ref 22–32)
CREATININE: 0.79 mg/dL (ref 0.44–1.00)
Calcium: 9 mg/dL (ref 8.9–10.3)
GFR calc non Af Amer: >60 mL/min (ref >60)
Glucose, Bld: 105 mg/dL — ABNORMAL HIGH (ref 70–99)
Potassium: 3.7 mmol/L (ref 3.5–5.1)
SODIUM: 139 mmol/L (ref 135–145)

## 2018-07-09 MED ORDER — FUROSEMIDE 20 MG PO TABS: ORAL_TABLET | ORAL | 0 refills | Status: DC

## 2018-07-09 NOTE — Progress Notes (Signed)
Clinical Social Worker facilitated patient discharge including contacting patient family and facility to confirm patient discharge plans.  Clinical information faxed to facility and family agreeable with plan.  CSW spoke with patients son Antony Haste and Antony Haste stated that his daughter Billy Coast will transport patient back to [facility] and [place]  .  RN to call [phone] and ask for RN Supervisor Lattie Haw for report prior to discharge.  Clinical Social Worker will sign off for now as social work intervention is no longer needed. Please consult [place] again if new need arises.  Rhea Pink, MSW, [place]

## 2018-07-09 NOTE — Progress Notes (Signed)
Physical Therapy Treatment Patient Details Name: Michelle Levine MRN: [id number] DOB: 09/10/1922 Today's Date: 07/09/2018    History of Present Illness 84 year old female who presented with lower extremity edema, CHF and pleural effusion. PMHx: aortic insufficiency, mitral insufficiency and tricuspid regurgitation with moderate pulmonary hypertension.  Metastatic lung cancer, atrial fibrillation, COPD with chronic hypoxic respiratory failure, L femur fx s/p  IM nail 01/31/18    PT Comments    Pt pleasant on arrival in bed. Pt able to progress ambulation 150 ft w/ RW on 3L [place] min guard for safety and O2 management. Pt reports this distance is about the same as her longest walk to the dining room at baseline. Pt able to complete ADLs modified independent w/ RW, only supervision and management of O2 tank.    Follow Up Recommendations  No PT follow up     Equipment Recommendations  None recommended by PT    Recommendations for Other Services       Precautions / Restrictions Precautions Precautions: Fall Precaution Comments: chronic O2 Restrictions Weight Bearing Restrictions: No    Mobility  Bed Mobility Overal bed mobility: Modified Independent             General bed mobility comments: HOB 30 degrees, pt reports hospital bed with elevated bed at baseline  Transfers Overall transfer level: Needs assistance Equipment used: Rolling walker (2 wheeled) Transfers: Sit to/from Stand Sit to Stand: Min guard         General transfer comment: increased time to achieve, guarding for safety  Ambulation/Gait Ambulation/Gait assistance: Min guard Gait Distance (Feet): 150 Feet Assistive device: Rolling walker (2 wheeled) Gait Pattern/deviations: Step-through pattern;Decreased stride length;Trunk flexed;Drifts right/left   Gait velocity interpretation: 1.31 - 2.62 ft/sec, indicative of limited community ambulator General Gait Details: min/guard for safety and balance, O2 line  management. VSS throughout on 3L   Stairs             Wheelchair Mobility    Modified Rankin (Stroke Patients Only)       Balance Overall balance assessment: Needs assistance   Sitting balance-Leahy Scale: Good       Standing balance-Leahy Scale: Poor Standing balance comment: bil UE support on RW in standing                            Cognition Arousal/Alertness: Awake/alert Behavior During Therapy: WFL for tasks assessed/performed Overall Cognitive Status: Within Functional Limits for tasks assessed                                        Exercises      General Comments        Pertinent Vitals/Pain Pain Assessment: No/denies pain    Home Living                      Prior Function            PT Goals (current goals can now be found in the care plan section) Progress towards PT goals: Progressing toward goals    Frequency           PT Plan Current plan remains appropriate    Co-evaluation              AM-PAC PT "6 Clicks" Daily Activity  Outcome Measure  Difficulty turning over in bed (including  adjusting bedclothes, sheets and blankets)?: A Little Difficulty moving from lying on back to sitting on the side of the bed? : A Little Difficulty sitting down on and standing up from a chair with arms (e.g., wheelchair, bedside commode, etc,.)?: A Little Help needed moving to and from a bed to chair (including a wheelchair)?: A Little Help needed walking in hospital room?: A Little Help needed climbing 3-5 steps with a railing? : A Lot 6 Click Score: 17    End of Session Equipment Utilized During Treatment: Gait belt;Oxygen Activity Tolerance: Patient tolerated treatment well Patient left: in chair;with call bell/phone within reach;with chair alarm set Nurse Communication: Mobility status PT Visit Diagnosis: Other abnormalities of gait and mobility (R26.89);Muscle weakness (generalized) (M62.81)      Time: 8677-3736 PT Time Calculation (min) (ACUTE ONLY): 27 min  Charges:  $Gait Training: 8-22 mins $Therapeutic Activity: 8-22 mins                     Samuella Bruin, [place]  Acute Rehab [phone]    Samuella Bruin 07/09/2018, 10:34 AM

## 2018-07-09 NOTE — Progress Notes (Signed)
Report called to [employer] at [facility] and [facility]. All questions answered about medication changes and patient's status. Patient given D/C instructions and prescriptions. Granddaughter at bedside, patient transported downstairs via personal wheelchair to family car.

## 2018-07-09 NOTE — Plan of Care (Signed)
  Problem: Education: Goal: Knowledge of General Education information will improve Description Including pain rating scale, medication(s)/side effects and non-pharmacologic comfort measures Outcome: Completed/Met   Problem: Health Behavior/Discharge Planning: Goal: Ability to manage health-related needs will improve Outcome: Completed/Met   Problem: Clinical Measurements: Goal: Ability to maintain clinical measurements within normal limits will improve Outcome: Completed/Met Goal: Will remain free from infection Outcome: Completed/Met Goal: Diagnostic test results will improve Outcome: Completed/Met Goal: Respiratory complications will improve Outcome: Completed/Met Goal: Cardiovascular complication will be avoided Outcome: Completed/Met   Problem: Activity: Goal: Risk for activity intolerance will decrease Outcome: Completed/Met   Problem: Nutrition: Goal: Adequate nutrition will be maintained Outcome: Completed/Met   Problem: Coping: Goal: Level of anxiety will decrease Outcome: Completed/Met   Problem: Elimination: Goal: Will not experience complications related to bowel motility Outcome: Completed/Met Goal: Will not experience complications related to urinary retention Outcome: Completed/Met   Problem: Pain Managment: Goal: General experience of comfort will improve Outcome: Completed/Met   Problem: Safety: Goal: Ability to remain free from injury will improve Outcome: Completed/Met   Problem: Skin Integrity: Goal: Risk for impaired skin integrity will decrease Outcome: Completed/Met   Problem: Education: Goal: Ability to demonstrate management of disease process will improve Outcome: Completed/Met Goal: Ability to verbalize understanding of medication therapies will improve Outcome: Completed/Met Goal: Individualized Educational Video(s) Outcome: Completed/Met   Problem: Activity: Goal: Capacity to carry out activities will improve Outcome:  Completed/Met   Problem: Cardiac: Goal: Ability to achieve and maintain adequate cardiopulmonary perfusion will improve Outcome: Completed/Met

## 2018-07-09 NOTE — Care Management Note (Signed)
Case Management Note Marvetta Gibbons RN, BSN Unit 4E- RN Care Coordinator - Cross coverage for Tawny Asal [phone]  Patient Details  Name: Michelle Levine MRN: [id number] Date of Birth: [birth year]  Subjective/Objective:   Pt admitted with acute on chronic HF                 Action/Plan: Pt from longterm care at [facility]- per MD pt stable for transition back to SNF today- [facility] orders placed for RN/PT- CSW to follow for transitions needs will fax [facility] orders to [facility] for [facility] needs- CSW following for return to [place].   Expected Discharge Date:  07/09/18               Expected Discharge Plan:  [place]  In-House Referral:  Clinical Social Work  Discharge planning Services  CM Consult  Post Acute Care Choice:  Home Health Choice offered to:     DME Arranged:    DME Agency:     HH Arranged:    [place] Agency:     Status of Service:  Completed, signed off  If discussed at [employer], dates discussed:    Discharge Disposition: skilled facility   Additional Comments:  Dawayne Patricia, RN 07/09/2018, 10:36 AM

## 2018-07-09 NOTE — Discharge Summary (Signed)
Physician Discharge Summary  Michelle Levine PJK:[id number] DOB: 14-Nov-1921 DOA: 07/03/2018  PCP: Crist Infante, MD  Admit date: 07/03/2018 Discharge date: 07/09/2018  Admitted From: Home  Disposition:  Home   Recommendations for Outpatient Follow-up and new medication changes:  1. Follow up with Dr. Joylene Draft in 7 days.  2. Furosemide has been increased to 80 mg in am and 60 mg in pm.  3. Discontinue amlodipine to prevent hypotension  4. Basic metabolic panel in 7 days to follow on renal function and electrolytes.   Home Health: yes   Equipment/Devices: no    Discharge Condition: stable  CODE STATUS: DNR  Diet recommendation: Heart healthy and fluid restriction 1200 ml per day.   Brief/Interim Summary: 84 year old female who presented with lower extremity edema. She does have significant past medical history for aortic insufficiency, mitral insufficiency and tricuspid regurgitation with moderate pulmonary hypertension. Metastatic lung cancer, atrial fibrillation, COPD with chronic hypoxic respiratory failure.Reported worsening lower extremity edema for the last 4 weeks, refractory to outpatient therapy with oral furosemide (increased dose,to 80 mg daily).She presented to the hospital due to persistent symptoms. On the initial physical examination blood pressure 132/78, heart rate 84, respiratory rate 24, oxygen saturation 92%. Heart withS1 S2 present, irregularly irregular, 3-6 systolic murmur at the apex, radiated to the axilla. Lungs with decreased breath sounds bilaterally, diffuse rhonchi and wheezing. Abdomen soft nontender, positive ++++bilateral lower extremity edema.  Sodium 141, potassium 3.5, chloride 100, bicarb 33, glucose 107, BUN 17, creatinine 0.82, BNP 799, white count 7.5, hemoglobin 9.9, hematocrit 39.1, platelets 160.  Chest x-ray with vascular congestion, bilateral pleural effusions more right and left.  EKG with atrial fibrillation and positive PVC.   Patient was  admitted to the hospital with working diagnosis of acute decompensated heart failure  1.  Acute on chronic systolic heart failure decompensation, left ventricle systolic function 30 to 80% with moderate diffuse hypokinesis.  Patient was admitted to the medical unit, she was placed on a remote telemetry monitor, she received aggressive diuresis with IV furosemide and negative fluid balance was achieved with significant improvement in her symptoms.  Follow-up echocardiography showed a reduction on her LV systolic function down to 30 to 35% with moderate diffuse hypokinesis, her mitral valve had severe regurgitation.  Lower extremities Doppler ultrasonography negative for deep vein thrombosis.  Continue heart failure management with bisoprolol.  Furosemide has been increased to 80 mg in the morning and 60 mg in the afternoon. Home health nurse has been arranged for outpatient follow-up.    2.  Worsening of valvular heart disease with severe mitral regurgitation.  Mitral valve with myxomatous proliferation, holosystolic prolapse of the posterior leaflet, severe regurgitation.  Patient was deemed not candidate for any surgical intervention, continue medical therapy with diuresis and beta-blockade.  She will need close follow-up as an outpatient.  Her CODE STATUS is DNR.  If continue progressive valvular heart disease to consider palliative care services as an outpatient.  3.  Chronic atrial fibrillation.  Heart rate remained well controlled with bisoprolol, continue anticoagulation with apixaban.  4.  Diuretic induced hypokalemia.  She had aggressive potassium correction with potassium chloride, her discharge potassium is 3.7, patient remained preserved with a serum creatinine 0.79.  Continue outpatient potassium, 40 mEq daily.  Close follow-up of kidney function and electrolytes.  5. Hypertension.  Her blood pressure remained stable, 127 to 130 mmHg.  Amlodipine has been discontinued to prevent  hypotension.  6.  COPD with chronic hypoxic respiratory failure/ with  no exacerbation continue home oxygen supplementation per nasal cannula and bronchodilators.   7.  Severe calorie protein malnutrition.  Patient was placed on nutritional supplements were hospitalized.  8. Hyperthyroid. Continue methimazole.   Discharge Diagnoses:  Principal Problem:   Acute on chronic diastolic heart failure (HCC) Active Problems:   Aortic insufficiency   COPD mixed type (HCC)   Essential hypertension   Bronchogenic cancer of right lung (HCC)   Chronic respiratory failure with hypoxia (HCC)   Bilateral leg edema   A-fib (HCC)   Protein-calorie malnutrition, severe    Discharge Instructions   Allergies as of 07/09/2018   No Known Allergies     Medication List    STOP taking these medications   amLODipine 2.5 MG tablet Commonly known as:  NORVASC     TAKE these medications   acetaminophen 500 MG tablet Commonly known as:  TYLENOL Take 500 mg by mouth every 8 (eight) hours as needed for moderate pain. What changed:  Another medication with the same name was removed. Continue taking this medication, and follow the directions you see here.   apixaban 2.5 MG Tabs tablet Commonly known as:  ELIQUIS Take 1 tablet (2.5 mg total) by mouth 2 (two) times daily.   bisoprolol 10 MG tablet Commonly known as:  ZEBETA Take 0.5 tablets (5 mg total) by mouth daily. What changed:  how much to take   CALCIUM + D PO Take 1 capsule by mouth daily.   citalopram 20 MG tablet Commonly known as:  CELEXA Take 20 mg by mouth daily.   docusate sodium 100 MG capsule Commonly known as:  COLACE Take 1 capsule (100 mg total) by mouth 2 (two) times daily. What changed:  when to take this   ferrous sulfate 325 (65 FE) MG tablet Take 1 tablet (325 mg total) by mouth 3 (three) times daily after meals. What changed:  when to take this   fluticasone 50 MCG/ACT nasal spray Commonly known as:   FLONASE Place 2 sprays into both nostrils daily as needed for allergies.   furosemide 20 MG tablet Commonly known as:  LASIX Take 4 tablets in the am and take 3 tablets in the pm. What changed:    medication strength  how much to take  how to take this  when to take this  additional instructions  Another medication with the same name was removed. Continue taking this medication, and follow the directions you see here.   galantamine 8 MG tablet Commonly known as:  RAZADYNE Take 8 mg by mouth daily.   HYDROcodone-acetaminophen 5-325 MG tablet Commonly known as:  NORCO/VICODIN Take 1-2 tablets by mouth every 4 (four) hours as needed for moderate pain or severe pain. What changed:    how much to take  when to take this   ipratropium-albuterol 0.5-2.5 (3) MG/3ML Soln Commonly known as:  DUONEB Take 3 mLs by nebulization 3 (three) times daily.   MELATONIN PO Take 3 mg by mouth daily.   methimazole 5 MG tablet Commonly known as:  TAPAZOLE Take 5 mg by mouth daily.   MUCUS RELIEF 400 MG Tabs tablet Generic drug:  guaifenesin Take 400 mg by mouth 2 (two) times daily.   multivitamin with minerals Tabs tablet Take 1 tablet by mouth daily.   potassium chloride SA 20 MEQ tablet Commonly known as:  K-DUR,KLOR-CON Take 40 mEq by mouth daily.   sodium chloride 0.65 % Soln nasal spray Commonly known as:  OCEAN Place 2 sprays  into both nostrils 2 (two) times daily.   traZODone 50 MG tablet Commonly known as:  DESYREL Take 50 mg by mouth at bedtime.       No Known Allergies  Consultations:  Cardiology    Procedures/Studies: Dg Chest 2 View  Result Date: 07/03/2018 CLINICAL DATA:  Lower extremity edema and wheezing. EXAM: CHEST - 2 VIEW COMPARISON:  04/30/2018.  CT 03/07/2018. FINDINGS: Cardiomegaly with bilateral pulmonary interstitial prominence and bilateral pleural effusions consistent with CHF. Persistent mass lesion right upper lung. Persistent  atelectatic changes right upper lung. Similar findings noted on prior exam. No acute bony abnormality. IMPRESSION: 1. Cardiomegaly with bilateral pulmonary interstitial prominence and bilateral pleural effusions consistent CHF. 2. Persistent mass lesion and atelectasis right upper lung. Similar findings noted on prior exam. Electronically Signed   By: [place]   On: 07/03/2018 12:53       Subjective: Patient is feeling better, dyspnea and edema are back to baseline, no nausea or vomiting, no chest pain.   Discharge Exam: Vitals:   07/09/18 0737 07/09/18 0755  BP: 130/83   Pulse: 85   Resp: 18   Temp: 98.3 F (36.8 C)   SpO2: 96% 98%   Vitals:   07/08/18 2051 07/08/18 2200 07/09/18 0737 07/09/18 0755  BP:  (!) 127/96 130/83   Pulse:  87 85   Resp:  18 18   Temp:  98.4 F (36.9 C) 98.3 F (36.8 C)   TempSrc:  Oral Oral   SpO2: 94% 95% 96% 98%  Weight:   52.8 kg   Height:        General: Not in pain or dyspnea  Neurology: Awake and alert, non focal  E ENT: no pallor, no icterus, oral mucosa moist Cardiovascular: No JVD. S1-S2 present, rhythmic, no gallops, rubs, or murmurs. Trace lower extremity edema. Pulmonary: vesicular breath sounds bilaterally, adequate air movement, no wheezing, scattered rhonchi or rales. Gastrointestinal. Abdomen with no organomegaly, non tender, no rebound or guarding Skin. No rashes Musculoskeletal: no joint deformities   The results of significant diagnostics from this hospitalization (including imaging, microbiology, ancillary and laboratory) are listed below for reference.     Microbiology: Recent Results (from the past 240 hour(s))  MRSA PCR Screening     Status: Abnormal   Collection Time: 07/03/18  6:55 PM  Result Value Ref Range Status   MRSA by PCR POSITIVE (A) NEGATIVE Final    Comment:        The GeneXpert MRSA Assay (FDA approved for NASAL specimens only), is one component of a comprehensive MRSA  colonization surveillance program. It is not intended to diagnose MRSA infection nor to guide or monitor treatment for MRSA infections. RESULT CALLED TO, READ BACK BY AND VERIFIED WITH: K.WALL,RN AT 2146 BY L.PITT 07/03/18      Labs: BNP (last 3 results) Recent Labs    07/03/18 1023  BNP 607.3*   Basic Metabolic Panel: Recent Labs  Lab 07/05/18 0411 07/06/18 0431 07/07/18 0529 07/08/18 0718 07/09/18 0427  NA 141 138 140 139 139  K 2.9* 3.3* 3.7 3.1* 3.7  CL 100 99 101 100 101  CO2 32 33* 30 31 32  GLUCOSE 95 110* 107* 100* 105*  BUN 14 15 14 15 17   CREATININE 1.00 0.84 0.73 0.76 0.79  CALCIUM 8.7* 8.8* 8.8* 8.6* 9.0  MG  --  2.0 2.0  --   --    Liver Function Tests: No results for input(s): AST, ALT, ALKPHOS, BILITOT,  PROT, ALBUMIN in the last 168 hours. No results for input(s): LIPASE, AMYLASE in the last 168 hours. No results for input(s): AMMONIA in the last 168 hours. CBC: Recent Labs  Lab 07/03/18 1023  WBC 7.5  HGB 11.9*  HCT 39.1  MCV 98.7  PLT 160   Cardiac Enzymes: No results for input(s): CKTOTAL, CKMB, CKMBINDEX, TROPONINI in the last 168 hours. BNP: Invalid input(s): POCBNP CBG: No results for input(s): GLUCAP in the last 168 hours. D-Dimer No results for input(s): DDIMER in the last 72 hours. Hgb A1c No results for input(s): HGBA1C in the last 72 hours. Lipid Profile No results for input(s): CHOL, HDL, LDLCALC, TRIG, CHOLHDL, LDLDIRECT in the last 72 hours. Thyroid function studies No results for input(s): TSH, T4TOTAL, T3FREE, THYROIDAB in the last 72 hours.  Invalid input(s): FREET3 Anemia work up No results for input(s): VITAMINB12, FOLATE, FERRITIN, TIBC, IRON, RETICCTPCT in the last 72 hours. Urinalysis No results found for: COLORURINE, APPEARANCEUR, [place], [place], [place], [place], [place], [place], PROTEINUR, UROBILINOGEN, NITRITE, LEUKOCYTESUR Sepsis Labs Invalid input(s): PROCALCITONIN,  WBC,   LACTICIDVEN Microbiology Recent Results (from the past 240 hour(s))  MRSA PCR Screening     Status: Abnormal   Collection Time: 07/03/18  6:55 PM  Result Value Ref Range Status   MRSA by PCR POSITIVE (A) NEGATIVE Final    Comment:        The GeneXpert MRSA Assay (FDA approved for NASAL specimens only), is one component of a comprehensive MRSA colonization surveillance program. It is not intended to diagnose MRSA infection nor to guide or monitor treatment for MRSA infections. RESULT CALLED TO, READ BACK BY AND VERIFIED WITH: K.WALL,RN AT 2146 BY L.PITT 07/03/18      Time coordinating discharge: 45 minutes  SIGNED:   Tawni Millers, MD  [facility] 07/09/2018, 8:33 AM Pager [phone]  If 7PM-7AM, please contact night-coverage [email or web address] Password [email or web address]

## 2018-07-11 DIAGNOSIS — I10 Essential (primary) hypertension: Secondary | ICD-10-CM | POA: Diagnosis not present

## 2018-07-11 DIAGNOSIS — I5023 Acute on chronic systolic (congestive) heart failure: Secondary | ICD-10-CM | POA: Diagnosis not present

## 2018-07-11 DIAGNOSIS — I340 Nonrheumatic mitral (valve) insufficiency: Secondary | ICD-10-CM | POA: Diagnosis not present

## 2018-07-11 DIAGNOSIS — J449 Chronic obstructive pulmonary disease, unspecified: Secondary | ICD-10-CM | POA: Diagnosis not present

## 2018-07-11 DIAGNOSIS — F5101 Primary insomnia: Secondary | ICD-10-CM | POA: Diagnosis not present

## 2018-07-11 DIAGNOSIS — D509 Iron deficiency anemia, unspecified: Secondary | ICD-10-CM | POA: Diagnosis not present

## 2018-07-11 DIAGNOSIS — M6281 Muscle weakness (generalized): Secondary | ICD-10-CM | POA: Diagnosis not present

## 2018-07-11 DIAGNOSIS — I482 Chronic atrial fibrillation: Secondary | ICD-10-CM | POA: Diagnosis not present

## 2018-07-11 DIAGNOSIS — E43 Unspecified severe protein-calorie malnutrition: Secondary | ICD-10-CM | POA: Diagnosis not present

## 2018-07-11 DIAGNOSIS — F330 Major depressive disorder, recurrent, mild: Secondary | ICD-10-CM | POA: Diagnosis not present

## 2018-07-11 DIAGNOSIS — R682 Dry mouth, unspecified: Secondary | ICD-10-CM | POA: Diagnosis not present

## 2018-07-11 DIAGNOSIS — K5900 Constipation, unspecified: Secondary | ICD-10-CM | POA: Diagnosis not present

## 2018-07-11 DIAGNOSIS — G3184 Mild cognitive impairment, so stated: Secondary | ICD-10-CM | POA: Diagnosis not present

## 2018-07-11 DIAGNOSIS — E0590 Thyrotoxicosis, unspecified without thyrotoxic crisis or storm: Secondary | ICD-10-CM | POA: Diagnosis not present

## 2018-07-11 DIAGNOSIS — E876 Hypokalemia: Secondary | ICD-10-CM | POA: Diagnosis not present

## 2018-07-12 DIAGNOSIS — E43 Unspecified severe protein-calorie malnutrition: Secondary | ICD-10-CM | POA: Diagnosis not present

## 2018-07-12 DIAGNOSIS — I5023 Acute on chronic systolic (congestive) heart failure: Secondary | ICD-10-CM | POA: Diagnosis not present

## 2018-07-12 DIAGNOSIS — M6281 Muscle weakness (generalized): Secondary | ICD-10-CM | POA: Diagnosis not present

## 2018-07-12 DIAGNOSIS — E0590 Thyrotoxicosis, unspecified without thyrotoxic crisis or storm: Secondary | ICD-10-CM | POA: Diagnosis not present

## 2018-07-12 DIAGNOSIS — I340 Nonrheumatic mitral (valve) insufficiency: Secondary | ICD-10-CM | POA: Diagnosis not present

## 2018-07-12 DIAGNOSIS — I482 Chronic atrial fibrillation: Secondary | ICD-10-CM | POA: Diagnosis not present

## 2018-07-12 DIAGNOSIS — F330 Major depressive disorder, recurrent, mild: Secondary | ICD-10-CM | POA: Diagnosis not present

## 2018-07-12 DIAGNOSIS — D509 Iron deficiency anemia, unspecified: Secondary | ICD-10-CM | POA: Diagnosis not present

## 2018-07-12 DIAGNOSIS — J449 Chronic obstructive pulmonary disease, unspecified: Secondary | ICD-10-CM | POA: Diagnosis not present

## 2018-07-12 DIAGNOSIS — R682 Dry mouth, unspecified: Secondary | ICD-10-CM | POA: Diagnosis not present

## 2018-07-12 DIAGNOSIS — I10 Essential (primary) hypertension: Secondary | ICD-10-CM | POA: Diagnosis not present

## 2018-07-12 DIAGNOSIS — K5900 Constipation, unspecified: Secondary | ICD-10-CM | POA: Diagnosis not present

## 2018-07-12 DIAGNOSIS — E876 Hypokalemia: Secondary | ICD-10-CM | POA: Diagnosis not present

## 2018-07-16 DIAGNOSIS — F3289 Other specified depressive episodes: Secondary | ICD-10-CM | POA: Diagnosis not present

## 2018-07-16 DIAGNOSIS — I10 Essential (primary) hypertension: Secondary | ICD-10-CM | POA: Diagnosis not present

## 2018-07-16 DIAGNOSIS — R413 Other amnesia: Secondary | ICD-10-CM | POA: Diagnosis not present

## 2018-07-16 DIAGNOSIS — I509 Heart failure, unspecified: Secondary | ICD-10-CM | POA: Diagnosis not present

## 2018-07-16 DIAGNOSIS — E876 Hypokalemia: Secondary | ICD-10-CM | POA: Diagnosis not present

## 2018-07-16 DIAGNOSIS — I340 Nonrheumatic mitral (valve) insufficiency: Secondary | ICD-10-CM | POA: Diagnosis not present

## 2018-07-16 DIAGNOSIS — K5900 Constipation, unspecified: Secondary | ICD-10-CM | POA: Diagnosis not present

## 2018-07-16 DIAGNOSIS — Z6820 Body mass index (BMI) 20.0-20.9, adult: Secondary | ICD-10-CM | POA: Diagnosis not present

## 2018-07-16 DIAGNOSIS — I5023 Acute on chronic systolic (congestive) heart failure: Secondary | ICD-10-CM | POA: Diagnosis not present

## 2018-07-16 DIAGNOSIS — I482 Chronic atrial fibrillation: Secondary | ICD-10-CM | POA: Diagnosis not present

## 2018-07-16 DIAGNOSIS — D509 Iron deficiency anemia, unspecified: Secondary | ICD-10-CM | POA: Diagnosis not present

## 2018-07-16 DIAGNOSIS — C3490 Malignant neoplasm of unspecified part of unspecified bronchus or lung: Secondary | ICD-10-CM | POA: Diagnosis not present

## 2018-07-16 DIAGNOSIS — M6281 Muscle weakness (generalized): Secondary | ICD-10-CM | POA: Diagnosis not present

## 2018-07-16 DIAGNOSIS — J449 Chronic obstructive pulmonary disease, unspecified: Secondary | ICD-10-CM | POA: Diagnosis not present

## 2018-07-16 DIAGNOSIS — R682 Dry mouth, unspecified: Secondary | ICD-10-CM | POA: Diagnosis not present

## 2018-07-16 DIAGNOSIS — D62 Acute posthemorrhagic anemia: Secondary | ICD-10-CM | POA: Diagnosis not present

## 2018-07-16 DIAGNOSIS — E43 Unspecified severe protein-calorie malnutrition: Secondary | ICD-10-CM | POA: Diagnosis not present

## 2018-07-16 DIAGNOSIS — E0590 Thyrotoxicosis, unspecified without thyrotoxic crisis or storm: Secondary | ICD-10-CM | POA: Diagnosis not present

## 2018-07-16 DIAGNOSIS — I4891 Unspecified atrial fibrillation: Secondary | ICD-10-CM | POA: Diagnosis not present

## 2018-07-17 DIAGNOSIS — D649 Anemia, unspecified: Secondary | ICD-10-CM | POA: Diagnosis not present

## 2018-07-17 DIAGNOSIS — I110 Hypertensive heart disease with heart failure: Secondary | ICD-10-CM | POA: Diagnosis not present

## 2018-07-17 DIAGNOSIS — I509 Heart failure, unspecified: Secondary | ICD-10-CM | POA: Diagnosis not present

## 2018-07-18 DIAGNOSIS — E876 Hypokalemia: Secondary | ICD-10-CM | POA: Diagnosis not present

## 2018-07-18 DIAGNOSIS — M6281 Muscle weakness (generalized): Secondary | ICD-10-CM | POA: Diagnosis not present

## 2018-07-18 DIAGNOSIS — I340 Nonrheumatic mitral (valve) insufficiency: Secondary | ICD-10-CM | POA: Diagnosis not present

## 2018-07-18 DIAGNOSIS — R682 Dry mouth, unspecified: Secondary | ICD-10-CM | POA: Diagnosis not present

## 2018-07-18 DIAGNOSIS — I10 Essential (primary) hypertension: Secondary | ICD-10-CM | POA: Diagnosis not present

## 2018-07-18 DIAGNOSIS — K5900 Constipation, unspecified: Secondary | ICD-10-CM | POA: Diagnosis not present

## 2018-07-18 DIAGNOSIS — E0590 Thyrotoxicosis, unspecified without thyrotoxic crisis or storm: Secondary | ICD-10-CM | POA: Diagnosis not present

## 2018-07-18 DIAGNOSIS — J449 Chronic obstructive pulmonary disease, unspecified: Secondary | ICD-10-CM | POA: Diagnosis not present

## 2018-07-18 DIAGNOSIS — I5023 Acute on chronic systolic (congestive) heart failure: Secondary | ICD-10-CM | POA: Diagnosis not present

## 2018-07-18 DIAGNOSIS — E43 Unspecified severe protein-calorie malnutrition: Secondary | ICD-10-CM | POA: Diagnosis not present

## 2018-07-18 DIAGNOSIS — J9611 Chronic respiratory failure with hypoxia: Secondary | ICD-10-CM | POA: Diagnosis not present

## 2018-07-18 DIAGNOSIS — I482 Chronic atrial fibrillation: Secondary | ICD-10-CM | POA: Diagnosis not present

## 2018-07-19 DIAGNOSIS — F330 Major depressive disorder, recurrent, mild: Secondary | ICD-10-CM | POA: Diagnosis not present

## 2018-07-20 ENCOUNTER — Non-Acute Institutional Stay: Payer: Self-pay | Admitting: Internal Medicine

## 2018-07-25 DIAGNOSIS — E0590 Thyrotoxicosis, unspecified without thyrotoxic crisis or storm: Secondary | ICD-10-CM | POA: Diagnosis not present

## 2018-07-25 DIAGNOSIS — I10 Essential (primary) hypertension: Secondary | ICD-10-CM | POA: Diagnosis not present

## 2018-07-25 DIAGNOSIS — E43 Unspecified severe protein-calorie malnutrition: Secondary | ICD-10-CM | POA: Diagnosis not present

## 2018-07-25 DIAGNOSIS — I5023 Acute on chronic systolic (congestive) heart failure: Secondary | ICD-10-CM | POA: Diagnosis not present

## 2018-07-25 DIAGNOSIS — J449 Chronic obstructive pulmonary disease, unspecified: Secondary | ICD-10-CM | POA: Diagnosis not present

## 2018-07-25 DIAGNOSIS — I482 Chronic atrial fibrillation: Secondary | ICD-10-CM | POA: Diagnosis not present

## 2018-07-25 DIAGNOSIS — E876 Hypokalemia: Secondary | ICD-10-CM | POA: Diagnosis not present

## 2018-07-25 DIAGNOSIS — M6281 Muscle weakness (generalized): Secondary | ICD-10-CM | POA: Diagnosis not present

## 2018-07-25 DIAGNOSIS — K5900 Constipation, unspecified: Secondary | ICD-10-CM | POA: Diagnosis not present

## 2018-07-25 DIAGNOSIS — R682 Dry mouth, unspecified: Secondary | ICD-10-CM | POA: Diagnosis not present

## 2018-07-25 DIAGNOSIS — J9611 Chronic respiratory failure with hypoxia: Secondary | ICD-10-CM | POA: Diagnosis not present

## 2018-07-25 DIAGNOSIS — I340 Nonrheumatic mitral (valve) insufficiency: Secondary | ICD-10-CM | POA: Diagnosis not present

## 2018-07-30 ENCOUNTER — Telehealth: Payer: Self-pay | Admitting: Pulmonary Disease

## 2018-07-30 DIAGNOSIS — I4891 Unspecified atrial fibrillation: Secondary | ICD-10-CM | POA: Diagnosis not present

## 2018-07-30 DIAGNOSIS — M6281 Muscle weakness (generalized): Secondary | ICD-10-CM | POA: Diagnosis not present

## 2018-07-30 DIAGNOSIS — E0580 Other thyrotoxicosis without thyrotoxic crisis or storm: Secondary | ICD-10-CM | POA: Diagnosis not present

## 2018-07-30 NOTE — Telephone Encounter (Signed)
OK to dc fluid restriction

## 2018-07-30 NOTE — Telephone Encounter (Signed)
Called and spoke with Webb Silversmith from [facility]. She stated that patient is now on pallative care and does not want to be placed on hospitalization anymore. Webb Silversmith also stated that patient came back to them with a 1700 ml fluid restriction that they would like to be lifted. RA please advise, thank you.

## 2018-07-30 NOTE — Telephone Encounter (Signed)
Called and spoke with Webb Silversmith, she is aware and verbalized understanding. Nothing further needed.

## 2018-07-31 ENCOUNTER — Ambulatory Visit (INDEPENDENT_AMBULATORY_CARE_PROVIDER_SITE_OTHER): Payer: Medicare Other | Admitting: Cardiology

## 2018-07-31 ENCOUNTER — Encounter: Payer: Self-pay | Admitting: Cardiology

## 2018-07-31 DIAGNOSIS — I5043 Acute on chronic combined systolic (congestive) and diastolic (congestive) heart failure: Secondary | ICD-10-CM

## 2018-07-31 DIAGNOSIS — I429 Cardiomyopathy, unspecified: Secondary | ICD-10-CM | POA: Insufficient documentation

## 2018-07-31 DIAGNOSIS — I481 Persistent atrial fibrillation: Secondary | ICD-10-CM | POA: Diagnosis not present

## 2018-07-31 DIAGNOSIS — I420 Dilated cardiomyopathy: Secondary | ICD-10-CM | POA: Diagnosis not present

## 2018-07-31 DIAGNOSIS — C3491 Malignant neoplasm of unspecified part of right bronchus or lung: Secondary | ICD-10-CM | POA: Diagnosis not present

## 2018-07-31 DIAGNOSIS — I340 Nonrheumatic mitral (valve) insufficiency: Secondary | ICD-10-CM | POA: Diagnosis not present

## 2018-07-31 DIAGNOSIS — I4819 Other persistent atrial fibrillation: Secondary | ICD-10-CM

## 2018-07-31 NOTE — Assessment & Plan Note (Signed)
Pt seen post hospital- [facility] 07/09/18.  Wgt stable

## 2018-07-31 NOTE — Assessment & Plan Note (Signed)
EF 30-35% Aug 2019 in setting of severe MR and AF

## 2018-07-31 NOTE — Patient Instructions (Signed)
Medication Instructions:  Your physician recommends that you continue on your current medications as directed. Please refer to the Current Medication list given to you today.  If you need a refill on your cardiac medications before your next appointment, please call your pharmacy.  Labwork: None   Testing/Procedures: None   Follow-Up: FOLLOW UP AS SCHEDULED  Any Other Special Instructions Will Be Listed Below (If Applicable).

## 2018-07-31 NOTE — Assessment & Plan Note (Signed)
CVR- on Eliquis low dose

## 2018-07-31 NOTE — Progress Notes (Signed)
07/31/2018 Michelle Levine   [birth year]  [id number]  Primary Physician Crist Infante, MD Primary Cardiologist: Dr Stanford Breed  HPI:  Delightful 84 yo female (her birthday was yesterday) with a PMH of AI, mitral insufficiency, TR, pulmonary edema, HTN, COPD, metastatic lung Ca, CAF and chronic respiratory failure on O2 who was admitted 8/27-07/09/18 withacute combined systolic and diastolicHF (new LV dysfunction)and persistent atrial fibrillation.She was diuresed from 127 lbs to 120 lbs at discharge. Echo showed new LVD with an EF of 30-35%. The pt is not interested in aggressive therapies. She was discharged to SNF and is seen today in follow up. Her granddaughter accompanied her. The pt is in a wheelchair, on O2. She appears to be in good spirits. Her weight at the nursing home has drifted down to 116 lbs and her fluid restriction has been lifted. Labs done 07/17/18 were stable- BUN/SCr 20.5/ 0.96. The notes suggest possible stopping Eliquis because of her risk of bleeding, final decision was deferred to Dr Stanford Breed. The pt has no history of prior bleeding. She has a walker but only goes to the bathroom with it. She has not had any falls at the SNF.    Current Outpatient Medications  Medication Sig Dispense Refill  . acetaminophen (TYLENOL) 500 MG tablet Take 500 mg by mouth every 8 (eight) hours as needed for moderate pain.    Marland Kitchen apixaban (ELIQUIS) 2.5 MG TABS tablet Take 1 tablet (2.5 mg total) by mouth 2 (two) times daily. 60 tablet 5  . bisoprolol (ZEBETA) 10 MG tablet Take 0.5 tablets (5 mg total) by mouth daily. (Patient taking differently: Take 10 mg by mouth daily. ) 30 tablet 5  . Calcium Carbonate-Vitamin D (CALCIUM + D PO) Take 1 capsule by mouth daily.     . citalopram (CELEXA) 20 MG tablet Take 20 mg by mouth daily.     Marland Kitchen docusate sodium (COLACE) 100 MG capsule Take 1 capsule (100 mg total) by mouth 2 (two) times daily. (Patient taking differently: Take 100 mg by mouth daily. ) 10  capsule 0  . ferrous sulfate 325 (65 FE) MG tablet Take 1 tablet (325 mg total) by mouth 3 (three) times daily after meals. (Patient taking differently: Take 325 mg by mouth daily. )  3  . fluticasone (FLONASE) 50 MCG/ACT nasal spray Place 2 sprays into both nostrils daily as needed for allergies.     . furosemide (LASIX) 20 MG tablet Take 4 tablets in the am and take 3 tablets in the pm. 210 tablet 0  . galantamine (RAZADYNE) 8 MG tablet Take 8 mg by mouth daily.   11  . guaifenesin (MUCUS RELIEF) 400 MG TABS tablet Take 400 mg by mouth 2 (two) times daily.    Marland Kitchen HYDROcodone-acetaminophen (NORCO/VICODIN) 5-325 MG tablet Take 1-2 tablets by mouth every 4 (four) hours as needed for moderate pain or severe pain. (Patient taking differently: Take 1 tablet by mouth every 12 (twelve) hours. ) 60 tablet 0  . ipratropium-albuterol (DUONEB) 0.5-2.5 (3) MG/3ML SOLN Take 3 mLs by nebulization 3 (three) times daily. 360 mL 3  . MELATONIN PO Take 3 mg by mouth daily.     . methimazole (TAPAZOLE) 5 MG tablet Take 5 mg by mouth daily.     . Multiple Vitamin (MULITIVITAMIN WITH MINERALS) TABS Take 1 tablet by mouth daily.     . potassium chloride SA (K-DUR,KLOR-CON) 20 MEQ tablet Take 40 mEq by mouth daily.     . sodium chloride (  OCEAN) 0.65 % SOLN nasal spray Place 2 sprays into both nostrils 2 (two) times daily.    . traZODone (DESYREL) 50 MG tablet Take 50 mg by mouth at bedtime.     No current facility-administered medications for this visit.     No Known Allergies  Past Medical History:  Diagnosis Date  . A-fib ([place])   . Aortic insufficiency   . CAD (coronary artery disease)   . Cancer ([place])   . Cataracts, bilateral   . CHF (congestive heart failure) ([place])   . COPD (chronic obstructive pulmonary disease) ([place])   . Hypertension   . Mitral insufficiency   . Pulmonary hypertension ([place])   . Thyroid disease   . Tricuspid regurgitation     Social History   Socioeconomic History  . Marital  status: Married    Spouse name: Not on file  . Number of children: Not on file  . Years of education: Not on file  . Highest education level: Not on file  Occupational History  . Occupation: retired  Scientific laboratory technician  . Financial resource strain: Not on file  . Food insecurity:    Worry: Not on file    Inability: Not on file  . Transportation needs:    Medical: Not on file    Non-medical: Not on file  Tobacco Use  . Smoking status: Never Smoker  . Smokeless tobacco: Never Used  Substance and Sexual Activity  . Alcohol use: No  . Drug use: No  . Sexual activity: Not on file  Lifestyle  . Physical activity:    Days per week: Not on file    Minutes per session: Not on file  . Stress: Not on file  Relationships  . Social connections:    Talks on phone: Not on file    Gets together: Not on file    Attends religious service: Not on file    Active member of club or organization: Not on file    Attends meetings of clubs or organizations: Not on file    Relationship status: Not on file  . Intimate partner violence:    Fear of current or ex partner: Not on file    Emotionally abused: Not on file    Physically abused: Not on file    Forced sexual activity: Not on file  Other Topics Concern  . Not on file  Social History Narrative  . Not on file     Family History  Problem Relation Age of Onset  . Heart attack Mother 30  . Pneumonia Father 68  . Heart attack Brother   . Leukemia Brother   . Heart attack Brother   . Leukemia Brother      Review of Systems: General: negative for chills, fever, night sweats or weight changes.  Cardiovascular: negative for chest pain, dyspnea on exertion, edema, orthopnea, palpitations, paroxysmal nocturnal dyspnea Dermatological: negative for rash Respiratory: negative for cough or wheezing Urologic: negative for hematuria Abdominal: negative for nausea, vomiting, diarrhea, bright red blood per rectum, melena, or hematemesis Neurologic:  negative for visual changes, syncope, or dizziness All other systems reviewed and are otherwise negative except as noted above.    Blood pressure 124/73, pulse 71, height 5\' 5"  (1.651 m), weight 116 lb (52.6 kg).  General appearance: alert, cooperative, appears stated age, no distress and in wheelchair, on O2 Neck: no JVD Lungs: kyphosis, decreased breath sounds Heart: irregularly irregular rhythm and 2-3/ 6 MR murmur Extremities: trace edema Neurologic: Grossly normal  ASSESSMENT AND PLAN:   Acute on chronic combined systolic and diastolic CHF (congestive heart failure) ([place]) Pt seen post hospital- [facility] 07/09/18.  Wgt stable  A-fib (HCC) CVR- on Eliquis low dose  Cardiomyopathy ([place]) EF 30-35% Aug 2019 in setting of severe MR and AF  Bronchogenic cancer of right lung ([place]) .  Severe mitral regurgitation .   PLAN  Same Rx. I will ask Dr Stanford Breed to review question of Eliquis- Pt's CHADS VASC score is 5, HASBLED is 2.   Kerin Ransom PA-C 07/31/2018 10:20 AM

## 2018-08-01 DIAGNOSIS — G301 Alzheimer's disease with late onset: Secondary | ICD-10-CM | POA: Diagnosis not present

## 2018-08-01 DIAGNOSIS — F330 Major depressive disorder, recurrent, mild: Secondary | ICD-10-CM | POA: Diagnosis not present

## 2018-08-01 DIAGNOSIS — F5101 Primary insomnia: Secondary | ICD-10-CM | POA: Diagnosis not present

## 2018-08-01 DIAGNOSIS — F0280 Dementia in other diseases classified elsewhere without behavioral disturbance: Secondary | ICD-10-CM | POA: Diagnosis not present

## 2018-08-02 DIAGNOSIS — E0580 Other thyrotoxicosis without thyrotoxic crisis or storm: Secondary | ICD-10-CM | POA: Diagnosis not present

## 2018-08-02 DIAGNOSIS — M6281 Muscle weakness (generalized): Secondary | ICD-10-CM | POA: Diagnosis not present

## 2018-08-02 DIAGNOSIS — I4891 Unspecified atrial fibrillation: Secondary | ICD-10-CM | POA: Diagnosis not present

## 2018-08-03 DIAGNOSIS — M6281 Muscle weakness (generalized): Secondary | ICD-10-CM | POA: Diagnosis not present

## 2018-08-03 DIAGNOSIS — I4891 Unspecified atrial fibrillation: Secondary | ICD-10-CM | POA: Diagnosis not present

## 2018-08-03 DIAGNOSIS — E0580 Other thyrotoxicosis without thyrotoxic crisis or storm: Secondary | ICD-10-CM | POA: Diagnosis not present

## 2018-08-06 DIAGNOSIS — I4891 Unspecified atrial fibrillation: Secondary | ICD-10-CM | POA: Diagnosis not present

## 2018-08-06 DIAGNOSIS — M6281 Muscle weakness (generalized): Secondary | ICD-10-CM | POA: Diagnosis not present

## 2018-08-06 DIAGNOSIS — E0580 Other thyrotoxicosis without thyrotoxic crisis or storm: Secondary | ICD-10-CM | POA: Diagnosis not present

## 2018-08-09 DIAGNOSIS — F330 Major depressive disorder, recurrent, mild: Secondary | ICD-10-CM | POA: Diagnosis not present

## 2018-08-10 DIAGNOSIS — K5900 Constipation, unspecified: Secondary | ICD-10-CM | POA: Diagnosis not present

## 2018-08-10 DIAGNOSIS — D509 Iron deficiency anemia, unspecified: Secondary | ICD-10-CM | POA: Diagnosis not present

## 2018-08-10 DIAGNOSIS — I5023 Acute on chronic systolic (congestive) heart failure: Secondary | ICD-10-CM | POA: Diagnosis not present

## 2018-08-10 DIAGNOSIS — E43 Unspecified severe protein-calorie malnutrition: Secondary | ICD-10-CM | POA: Diagnosis not present

## 2018-08-10 DIAGNOSIS — J449 Chronic obstructive pulmonary disease, unspecified: Secondary | ICD-10-CM | POA: Diagnosis not present

## 2018-08-10 DIAGNOSIS — R682 Dry mouth, unspecified: Secondary | ICD-10-CM | POA: Diagnosis not present

## 2018-08-10 DIAGNOSIS — I10 Essential (primary) hypertension: Secondary | ICD-10-CM | POA: Diagnosis not present

## 2018-08-10 DIAGNOSIS — M6281 Muscle weakness (generalized): Secondary | ICD-10-CM | POA: Diagnosis not present

## 2018-08-10 DIAGNOSIS — I340 Nonrheumatic mitral (valve) insufficiency: Secondary | ICD-10-CM | POA: Diagnosis not present

## 2018-08-10 DIAGNOSIS — E876 Hypokalemia: Secondary | ICD-10-CM | POA: Diagnosis not present

## 2018-08-10 DIAGNOSIS — E0590 Thyrotoxicosis, unspecified without thyrotoxic crisis or storm: Secondary | ICD-10-CM | POA: Diagnosis not present

## 2018-08-10 DIAGNOSIS — J9611 Chronic respiratory failure with hypoxia: Secondary | ICD-10-CM | POA: Diagnosis not present

## 2018-08-14 DIAGNOSIS — E0590 Thyrotoxicosis, unspecified without thyrotoxic crisis or storm: Secondary | ICD-10-CM | POA: Diagnosis not present

## 2018-08-14 DIAGNOSIS — D509 Iron deficiency anemia, unspecified: Secondary | ICD-10-CM | POA: Diagnosis not present

## 2018-08-14 DIAGNOSIS — I5022 Chronic systolic (congestive) heart failure: Secondary | ICD-10-CM | POA: Diagnosis not present

## 2018-08-14 DIAGNOSIS — J449 Chronic obstructive pulmonary disease, unspecified: Secondary | ICD-10-CM | POA: Diagnosis not present

## 2018-08-14 DIAGNOSIS — I340 Nonrheumatic mitral (valve) insufficiency: Secondary | ICD-10-CM | POA: Diagnosis not present

## 2018-08-14 DIAGNOSIS — I480 Paroxysmal atrial fibrillation: Secondary | ICD-10-CM | POA: Diagnosis not present

## 2018-08-14 DIAGNOSIS — R682 Dry mouth, unspecified: Secondary | ICD-10-CM | POA: Diagnosis not present

## 2018-08-14 DIAGNOSIS — E43 Unspecified severe protein-calorie malnutrition: Secondary | ICD-10-CM | POA: Diagnosis not present

## 2018-08-14 DIAGNOSIS — I10 Essential (primary) hypertension: Secondary | ICD-10-CM | POA: Diagnosis not present

## 2018-08-14 DIAGNOSIS — J309 Allergic rhinitis, unspecified: Secondary | ICD-10-CM | POA: Diagnosis not present

## 2018-08-14 DIAGNOSIS — M6281 Muscle weakness (generalized): Secondary | ICD-10-CM | POA: Diagnosis not present

## 2018-08-14 DIAGNOSIS — K5900 Constipation, unspecified: Secondary | ICD-10-CM | POA: Diagnosis not present

## 2018-08-16 DIAGNOSIS — F330 Major depressive disorder, recurrent, mild: Secondary | ICD-10-CM | POA: Diagnosis not present

## 2018-08-20 DIAGNOSIS — I4891 Unspecified atrial fibrillation: Secondary | ICD-10-CM | POA: Diagnosis not present

## 2018-08-20 DIAGNOSIS — E0580 Other thyrotoxicosis without thyrotoxic crisis or storm: Secondary | ICD-10-CM | POA: Diagnosis not present

## 2018-08-20 DIAGNOSIS — M6281 Muscle weakness (generalized): Secondary | ICD-10-CM | POA: Diagnosis not present

## 2018-08-27 DIAGNOSIS — I4891 Unspecified atrial fibrillation: Secondary | ICD-10-CM | POA: Diagnosis not present

## 2018-08-27 DIAGNOSIS — M6281 Muscle weakness (generalized): Secondary | ICD-10-CM | POA: Diagnosis not present

## 2018-08-27 DIAGNOSIS — E0580 Other thyrotoxicosis without thyrotoxic crisis or storm: Secondary | ICD-10-CM | POA: Diagnosis not present

## 2018-08-30 DIAGNOSIS — F330 Major depressive disorder, recurrent, mild: Secondary | ICD-10-CM | POA: Diagnosis not present

## 2018-09-03 DIAGNOSIS — M6281 Muscle weakness (generalized): Secondary | ICD-10-CM | POA: Diagnosis not present

## 2018-09-03 DIAGNOSIS — E0580 Other thyrotoxicosis without thyrotoxic crisis or storm: Secondary | ICD-10-CM | POA: Diagnosis not present

## 2018-09-03 DIAGNOSIS — I4891 Unspecified atrial fibrillation: Secondary | ICD-10-CM | POA: Diagnosis not present

## 2018-09-05 DIAGNOSIS — F331 Major depressive disorder, recurrent, moderate: Secondary | ICD-10-CM | POA: Diagnosis not present

## 2018-09-05 DIAGNOSIS — G4701 Insomnia due to medical condition: Secondary | ICD-10-CM | POA: Diagnosis not present

## 2018-09-05 DIAGNOSIS — G301 Alzheimer's disease with late onset: Secondary | ICD-10-CM | POA: Diagnosis not present

## 2018-09-05 DIAGNOSIS — F0280 Dementia in other diseases classified elsewhere without behavioral disturbance: Secondary | ICD-10-CM | POA: Diagnosis not present

## 2018-09-06 DIAGNOSIS — F330 Major depressive disorder, recurrent, mild: Secondary | ICD-10-CM | POA: Diagnosis not present

## 2018-09-12 DIAGNOSIS — F0280 Dementia in other diseases classified elsewhere without behavioral disturbance: Secondary | ICD-10-CM | POA: Diagnosis not present

## 2018-09-12 DIAGNOSIS — F331 Major depressive disorder, recurrent, moderate: Secondary | ICD-10-CM | POA: Diagnosis not present

## 2018-09-12 DIAGNOSIS — G301 Alzheimer's disease with late onset: Secondary | ICD-10-CM | POA: Diagnosis not present

## 2018-09-12 DIAGNOSIS — G4701 Insomnia due to medical condition: Secondary | ICD-10-CM | POA: Diagnosis not present

## 2018-09-12 NOTE — Progress Notes (Signed)
HPI: FU CHF, aortic insufficiency, mitral insufficiency, and tricuspid regurgitation with moderate pulmonary hypertension. Patient has been diagnosed with lung cancer.At previous ov, we elected not to pursue fu echo as she is clear she would never consider valve surgery.  Patient seen in June 2019 and noted to be in atrial fibrillation.  Her bisoprolol dose was increased.  She was started on apixaban.    Patient admitted August 2019 with congestive heart failure.  Echocardiogram showed ejection fraction 30 to 35%; mild aortic stenosis, moderate aortic insufficiency, moderate mitral regurgitation, severe biatrial enlargement, moderate to severe RV dysfunction and moderate tricuspid regurgitation.Marland Kitchen  She was diuresed with improvement.  Conservative measures felt appropriate given age and multiple medical problems.  Since I last saw her,she does have dyspnea.  This is slightly improved since she was discharged from the hospital.  She also has some pedal edema.  No chest pain.  No syncope.  Current Outpatient Medications  Medication Sig Dispense Refill  . acetaminophen (TYLENOL) 500 MG tablet Take 500 mg by mouth every 8 (eight) hours as needed for moderate pain.    Marland Kitchen apixaban (ELIQUIS) 2.5 MG TABS tablet Take 1 tablet (2.5 mg total) by mouth 2 (two) times daily. 60 tablet 5  . bisoprolol (ZEBETA) 10 MG tablet Take 0.5 tablets (5 mg total) by mouth daily. (Patient taking differently: Take 10 mg by mouth daily. ) 30 tablet 5  . Calcium Carbonate-Vitamin D (CALCIUM + D PO) Take 1 capsule by mouth daily.     . citalopram (CELEXA) 20 MG tablet Take 20 mg by mouth daily.     Marland Kitchen docusate sodium (COLACE) 100 MG capsule Take 1 capsule (100 mg total) by mouth 2 (two) times daily. (Patient taking differently: Take 100 mg by mouth daily. ) 10 capsule 0  . ferrous sulfate 325 (65 FE) MG tablet Take 1 tablet (325 mg total) by mouth 3 (three) times daily after meals. (Patient taking differently: Take 325 mg by  mouth daily. )  3  . fluticasone (FLONASE) 50 MCG/ACT nasal spray Place 2 sprays into both nostrils daily as needed for allergies.     . furosemide (LASIX) 20 MG tablet Take 4 tablets in the am and take 3 tablets in the pm. 210 tablet 0  . galantamine (RAZADYNE) 8 MG tablet Take 8 mg by mouth daily.   11  . guaifenesin (MUCUS RELIEF) 400 MG TABS tablet Take 400 mg by mouth 2 (two) times daily.    Marland Kitchen HYDROcodone-acetaminophen (NORCO/VICODIN) 5-325 MG tablet Take 1-2 tablets by mouth every 4 (four) hours as needed for moderate pain or severe pain. (Patient taking differently: Take 1 tablet by mouth every 12 (twelve) hours. ) 60 tablet 0  . ipratropium-albuterol (DUONEB) 0.5-2.5 (3) MG/3ML SOLN Take 3 mLs by nebulization 3 (three) times daily. 360 mL 3  . MELATONIN PO Take 3 mg by mouth daily.     . methimazole (TAPAZOLE) 5 MG tablet Take 5 mg by mouth daily.     . Multiple Vitamin (MULITIVITAMIN WITH MINERALS) TABS Take 1 tablet by mouth daily.     . ondansetron (ZOFRAN) 4 MG tablet Take 4 mg by mouth every 8 (eight) hours as needed for nausea or vomiting.    . potassium chloride SA (K-DUR,KLOR-CON) 20 MEQ tablet Take 40 mEq by mouth daily.     . sodium chloride (OCEAN) 0.65 % SOLN nasal spray Place 2 sprays into both nostrils 2 (two) times daily.    Marland Kitchen  traZODone (DESYREL) 50 MG tablet Take 50 mg by mouth at bedtime.     No current facility-administered medications for this visit.      Past Medical History:  Diagnosis Date  . A-fib ([place])   . Aortic insufficiency   . CAD (coronary artery disease)   . Cancer ([place])   . Cataracts, bilateral   . CHF (congestive heart failure) ([place])   . COPD (chronic obstructive pulmonary disease) ([place])   . Hypertension   . Mitral insufficiency   . Pulmonary hypertension ([place])   . Thyroid disease   . Tricuspid regurgitation     Past Surgical History:  Procedure Laterality Date  . ABDOMINAL HYSTERECTOMY    . APPENDECTOMY    . BREAST LUMPECTOMY    .  CATARACT EXTRACTION    . EYE SURGERY    . FEMUR IM NAIL Left 01/30/2018   Procedure: INTRAMEDULLARY (IM) NAIL FEMORAL LEFT;  Surgeon: Paralee Cancel, MD;  Location: [facility];  Service: Orthopedics;  Laterality: Left;    Social History   Socioeconomic History  . Marital status: Married    Spouse name: Not on file  . Number of children: Not on file  . Years of education: Not on file  . Highest education level: Not on file  Occupational History  . Occupation: retired  Scientific laboratory technician  . Financial resource strain: Not on file  . Food insecurity:    Worry: Not on file    Inability: Not on file  . Transportation needs:    Medical: Not on file    Non-medical: Not on file  Tobacco Use  . Smoking status: Never Smoker  . Smokeless tobacco: Never Used  Substance and Sexual Activity  . Alcohol use: No  . Drug use: No  . Sexual activity: Not on file  Lifestyle  . Physical activity:    Days per week: Not on file    Minutes per session: Not on file  . Stress: Not on file  Relationships  . Social connections:    Talks on phone: Not on file    Gets together: Not on file    Attends religious service: Not on file    Active member of club or organization: Not on file    Attends meetings of clubs or organizations: Not on file    Relationship status: Not on file  . Intimate partner violence:    Fear of current or ex partner: Not on file    Emotionally abused: Not on file    Physically abused: Not on file    Forced sexual activity: Not on file  Other Topics Concern  . Not on file  Social History Narrative  . Not on file    Family History  Problem Relation Age of Onset  . Heart attack Mother 17  . Pneumonia Father 54  . Heart attack Brother   . Leukemia Brother   . Heart attack Brother   . Leukemia Brother     ROS: Neck pain but no fevers or chills, productive cough, hemoptysis, dysphasia, odynophagia, melena, hematochezia, dysuria, hematuria, rash, seizure activity, orthopnea, PND,  claudication. Remaining systems are negative.  Physical Exam: Well-developed frail in no acute distress.  Skin is warm and dry.  HEENT is normal.  Neck is supple.  Chest with diminished BS throughout Cardiovascular exam is irregular, 2/6 systolic murmur Abdominal exam nontender or distended. No masses palpated. Extremities show 1+ edema. neuro grossly intact  A/P  1 acute on chronic combined systolic/diastolic congestive  heart failure-she is volume overloaded on examination today.  Increase Lasix to 80 mg twice daily.  Check potassium and renal function in 1 week.  We discussed the importance of fluid restriction and low-sodium diet.  Check potassium and renal function.  2 aortic insufficiency/aortic stenosis/mitral regurgitation-patient has significant valvular heart disease.  She is not a candidate for valve surgery.  I have not recommended follow-up echocardiograms in the future.  3 persistent atrial fibrillation-plan to continue beta-blocker for rate control.  Continue apixaban at present dose.  4 cardiomyopathy-likely secondary to valvular heart disease.  Continue beta-blocker.  I have not added an ARB as blood pressure is borderline.  5 history of lung cancer  We will not pursue aggressive testing or cardiac measures given age, overall medical condition and multiple comorbidities.  Kirk Ruths, MD

## 2018-09-13 DIAGNOSIS — E0580 Other thyrotoxicosis without thyrotoxic crisis or storm: Secondary | ICD-10-CM | POA: Diagnosis not present

## 2018-09-13 DIAGNOSIS — K5900 Constipation, unspecified: Secondary | ICD-10-CM | POA: Diagnosis not present

## 2018-09-13 DIAGNOSIS — I340 Nonrheumatic mitral (valve) insufficiency: Secondary | ICD-10-CM | POA: Diagnosis not present

## 2018-09-13 DIAGNOSIS — I5022 Chronic systolic (congestive) heart failure: Secondary | ICD-10-CM | POA: Diagnosis not present

## 2018-09-13 DIAGNOSIS — C3490 Malignant neoplasm of unspecified part of unspecified bronchus or lung: Secondary | ICD-10-CM | POA: Diagnosis not present

## 2018-09-13 DIAGNOSIS — F330 Major depressive disorder, recurrent, mild: Secondary | ICD-10-CM | POA: Diagnosis not present

## 2018-09-13 DIAGNOSIS — R682 Dry mouth, unspecified: Secondary | ICD-10-CM | POA: Diagnosis not present

## 2018-09-13 DIAGNOSIS — R635 Abnormal weight gain: Secondary | ICD-10-CM | POA: Diagnosis not present

## 2018-09-13 DIAGNOSIS — D509 Iron deficiency anemia, unspecified: Secondary | ICD-10-CM | POA: Diagnosis not present

## 2018-09-13 DIAGNOSIS — E43 Unspecified severe protein-calorie malnutrition: Secondary | ICD-10-CM | POA: Diagnosis not present

## 2018-09-13 DIAGNOSIS — M6281 Muscle weakness (generalized): Secondary | ICD-10-CM | POA: Diagnosis not present

## 2018-09-13 DIAGNOSIS — I480 Paroxysmal atrial fibrillation: Secondary | ICD-10-CM | POA: Diagnosis not present

## 2018-09-13 DIAGNOSIS — E0590 Thyrotoxicosis, unspecified without thyrotoxic crisis or storm: Secondary | ICD-10-CM | POA: Diagnosis not present

## 2018-09-13 DIAGNOSIS — I10 Essential (primary) hypertension: Secondary | ICD-10-CM | POA: Diagnosis not present

## 2018-09-13 DIAGNOSIS — R1312 Dysphagia, oropharyngeal phase: Secondary | ICD-10-CM | POA: Diagnosis not present

## 2018-09-13 DIAGNOSIS — J449 Chronic obstructive pulmonary disease, unspecified: Secondary | ICD-10-CM | POA: Diagnosis not present

## 2018-09-13 DIAGNOSIS — I4891 Unspecified atrial fibrillation: Secondary | ICD-10-CM | POA: Diagnosis not present

## 2018-09-17 DIAGNOSIS — R7989 Other specified abnormal findings of blood chemistry: Secondary | ICD-10-CM | POA: Diagnosis not present

## 2018-09-17 DIAGNOSIS — D649 Anemia, unspecified: Secondary | ICD-10-CM | POA: Diagnosis not present

## 2018-09-18 DIAGNOSIS — I4891 Unspecified atrial fibrillation: Secondary | ICD-10-CM | POA: Diagnosis not present

## 2018-09-18 DIAGNOSIS — E0580 Other thyrotoxicosis without thyrotoxic crisis or storm: Secondary | ICD-10-CM | POA: Diagnosis not present

## 2018-09-18 DIAGNOSIS — M6281 Muscle weakness (generalized): Secondary | ICD-10-CM | POA: Diagnosis not present

## 2018-09-18 DIAGNOSIS — E43 Unspecified severe protein-calorie malnutrition: Secondary | ICD-10-CM | POA: Diagnosis not present

## 2018-09-18 DIAGNOSIS — R1312 Dysphagia, oropharyngeal phase: Secondary | ICD-10-CM | POA: Diagnosis not present

## 2018-09-18 DIAGNOSIS — C3490 Malignant neoplasm of unspecified part of unspecified bronchus or lung: Secondary | ICD-10-CM | POA: Diagnosis not present

## 2018-09-20 DIAGNOSIS — F330 Major depressive disorder, recurrent, mild: Secondary | ICD-10-CM | POA: Diagnosis not present

## 2018-09-24 ENCOUNTER — Ambulatory Visit (INDEPENDENT_AMBULATORY_CARE_PROVIDER_SITE_OTHER): Payer: Medicare Other | Admitting: Cardiology

## 2018-09-24 ENCOUNTER — Encounter: Payer: Self-pay | Admitting: Cardiology

## 2018-09-24 VITALS — BP 100/64 | HR 84 | Ht 65.0 in | Wt 123.8 lb

## 2018-09-24 DIAGNOSIS — I351 Nonrheumatic aortic (valve) insufficiency: Secondary | ICD-10-CM | POA: Diagnosis not present

## 2018-09-24 DIAGNOSIS — I5043 Acute on chronic combined systolic (congestive) and diastolic (congestive) heart failure: Secondary | ICD-10-CM

## 2018-09-24 DIAGNOSIS — I4819 Other persistent atrial fibrillation: Secondary | ICD-10-CM

## 2018-09-24 MED ORDER — FUROSEMIDE 80 MG PO TABS: 80.0000 mg | ORAL_TABLET | Freq: Two times a day (BID) | ORAL | 3 refills | Status: AC

## 2018-09-24 NOTE — Patient Instructions (Signed)
Medication Instructions:  INCREASE FUROSEMIDE TO 80 MG TWICE DAILY If you need a refill on your cardiac medications before your next appointment, please call your pharmacy.   Lab work: Your physician recommends that you return for lab work in: [place] If you have labs (blood work) drawn today and your tests are completely normal, you will receive your results only by: Marland Kitchen MyChart Message (if you have MyChart) OR . A paper copy in the mail If you have any lab test that is abnormal or we need to change your treatment, we will call you to review the results.  Follow-Up: Your physician recommends that you schedule a follow-up appointment in: [place]

## 2018-09-28 DIAGNOSIS — R635 Abnormal weight gain: Secondary | ICD-10-CM | POA: Diagnosis not present

## 2018-09-28 DIAGNOSIS — I5022 Chronic systolic (congestive) heart failure: Secondary | ICD-10-CM | POA: Diagnosis not present

## 2018-09-28 DIAGNOSIS — I480 Paroxysmal atrial fibrillation: Secondary | ICD-10-CM | POA: Diagnosis not present

## 2018-09-28 DIAGNOSIS — B353 Tinea pedis: Secondary | ICD-10-CM | POA: Diagnosis not present

## 2018-09-28 DIAGNOSIS — R296 Repeated falls: Secondary | ICD-10-CM | POA: Diagnosis not present

## 2018-09-28 DIAGNOSIS — J449 Chronic obstructive pulmonary disease, unspecified: Secondary | ICD-10-CM | POA: Diagnosis not present

## 2018-09-28 DIAGNOSIS — E43 Unspecified severe protein-calorie malnutrition: Secondary | ICD-10-CM | POA: Diagnosis not present

## 2018-09-28 DIAGNOSIS — I340 Nonrheumatic mitral (valve) insufficiency: Secondary | ICD-10-CM | POA: Diagnosis not present

## 2018-09-28 DIAGNOSIS — R634 Abnormal weight loss: Secondary | ICD-10-CM | POA: Diagnosis not present

## 2018-09-28 DIAGNOSIS — I10 Essential (primary) hypertension: Secondary | ICD-10-CM | POA: Diagnosis not present

## 2018-09-28 DIAGNOSIS — M6281 Muscle weakness (generalized): Secondary | ICD-10-CM | POA: Diagnosis not present

## 2018-09-28 DIAGNOSIS — R682 Dry mouth, unspecified: Secondary | ICD-10-CM | POA: Diagnosis not present

## 2018-10-01 DIAGNOSIS — D649 Anemia, unspecified: Secondary | ICD-10-CM | POA: Diagnosis not present

## 2018-10-01 DIAGNOSIS — R609 Edema, unspecified: Secondary | ICD-10-CM | POA: Diagnosis not present

## 2018-10-01 DIAGNOSIS — I509 Heart failure, unspecified: Secondary | ICD-10-CM | POA: Diagnosis not present

## 2018-10-05 DIAGNOSIS — G4700 Insomnia, unspecified: Secondary | ICD-10-CM | POA: Diagnosis not present

## 2018-10-05 DIAGNOSIS — R0602 Shortness of breath: Secondary | ICD-10-CM | POA: Diagnosis not present

## 2018-10-10 DIAGNOSIS — G301 Alzheimer's disease with late onset: Secondary | ICD-10-CM | POA: Diagnosis not present

## 2018-10-10 DIAGNOSIS — G4701 Insomnia due to medical condition: Secondary | ICD-10-CM | POA: Diagnosis not present

## 2018-10-10 DIAGNOSIS — F331 Major depressive disorder, recurrent, moderate: Secondary | ICD-10-CM | POA: Diagnosis not present

## 2018-10-10 DIAGNOSIS — F0280 Dementia in other diseases classified elsewhere without behavioral disturbance: Secondary | ICD-10-CM | POA: Diagnosis not present

## 2018-10-15 DIAGNOSIS — J9611 Chronic respiratory failure with hypoxia: Secondary | ICD-10-CM | POA: Diagnosis not present

## 2018-10-15 DIAGNOSIS — R600 Localized edema: Secondary | ICD-10-CM | POA: Diagnosis not present

## 2018-10-16 DIAGNOSIS — R682 Dry mouth, unspecified: Secondary | ICD-10-CM | POA: Diagnosis not present

## 2018-10-16 DIAGNOSIS — R296 Repeated falls: Secondary | ICD-10-CM | POA: Diagnosis not present

## 2018-10-16 DIAGNOSIS — K5900 Constipation, unspecified: Secondary | ICD-10-CM | POA: Diagnosis not present

## 2018-10-16 DIAGNOSIS — B353 Tinea pedis: Secondary | ICD-10-CM | POA: Diagnosis not present

## 2018-10-16 DIAGNOSIS — I340 Nonrheumatic mitral (valve) insufficiency: Secondary | ICD-10-CM | POA: Diagnosis not present

## 2018-10-16 DIAGNOSIS — I10 Essential (primary) hypertension: Secondary | ICD-10-CM | POA: Diagnosis not present

## 2018-10-16 DIAGNOSIS — J449 Chronic obstructive pulmonary disease, unspecified: Secondary | ICD-10-CM | POA: Diagnosis not present

## 2018-10-16 DIAGNOSIS — D649 Anemia, unspecified: Secondary | ICD-10-CM | POA: Diagnosis not present

## 2018-10-16 DIAGNOSIS — R7989 Other specified abnormal findings of blood chemistry: Secondary | ICD-10-CM | POA: Diagnosis not present

## 2018-10-16 DIAGNOSIS — I5022 Chronic systolic (congestive) heart failure: Secondary | ICD-10-CM | POA: Diagnosis not present

## 2018-10-16 DIAGNOSIS — M6281 Muscle weakness (generalized): Secondary | ICD-10-CM | POA: Diagnosis not present

## 2018-10-16 DIAGNOSIS — E43 Unspecified severe protein-calorie malnutrition: Secondary | ICD-10-CM | POA: Diagnosis not present

## 2018-10-16 DIAGNOSIS — I480 Paroxysmal atrial fibrillation: Secondary | ICD-10-CM | POA: Diagnosis not present

## 2018-10-16 DIAGNOSIS — E0590 Thyrotoxicosis, unspecified without thyrotoxic crisis or storm: Secondary | ICD-10-CM | POA: Diagnosis not present

## 2018-10-18 DIAGNOSIS — I517 Cardiomegaly: Secondary | ICD-10-CM | POA: Diagnosis not present

## 2018-10-18 DIAGNOSIS — J90 Pleural effusion, not elsewhere classified: Secondary | ICD-10-CM | POA: Diagnosis not present

## 2018-10-19 DIAGNOSIS — I5022 Chronic systolic (congestive) heart failure: Secondary | ICD-10-CM | POA: Diagnosis not present

## 2018-10-19 DIAGNOSIS — R05 Cough: Secondary | ICD-10-CM | POA: Diagnosis not present

## 2018-10-19 DIAGNOSIS — J449 Chronic obstructive pulmonary disease, unspecified: Secondary | ICD-10-CM | POA: Diagnosis not present

## 2018-11-07 DEATH — deceased

## 2018-12-10 ENCOUNTER — Ambulatory Visit: Payer: Medicare Other | Admitting: Primary Care

## 2018-12-31 ENCOUNTER — Ambulatory Visit: Payer: Medicare Other | Admitting: Cardiology

## 2019-04-05 ENCOUNTER — Other Ambulatory Visit: Payer: Medicare Other

## 2019-04-09 ENCOUNTER — Ambulatory Visit: Payer: Medicare Other | Admitting: Internal Medicine
